# Patient Record
Sex: Male | Born: 1950
Health system: Southern US, Community
[De-identification: ages and names within clinical notes are randomized; demographics above are authoritative.]

## PROBLEM LIST (undated history)

## (undated) DIAGNOSIS — Z1379 Encounter for other screening for genetic and chromosomal anomalies: Secondary | ICD-10-CM

## (undated) DIAGNOSIS — H409 Unspecified glaucoma: Secondary | ICD-10-CM

## (undated) DIAGNOSIS — Z8042 Family history of malignant neoplasm of prostate: Secondary | ICD-10-CM

## (undated) DIAGNOSIS — K635 Polyp of colon: Secondary | ICD-10-CM

## (undated) DIAGNOSIS — I10 Essential (primary) hypertension: Secondary | ICD-10-CM

## (undated) DIAGNOSIS — C189 Malignant neoplasm of colon, unspecified: Secondary | ICD-10-CM

## (undated) HISTORY — PX: EYE SURGERY: SHX253

## (undated) HISTORY — DX: Polyp of colon: K63.5

## (undated) HISTORY — DX: Family history of malignant neoplasm of prostate: Z80.42

## (undated) HISTORY — DX: Essential (primary) hypertension: I10

## (undated) HISTORY — DX: Malignant neoplasm of colon, unspecified: C18.9

## (undated) HISTORY — DX: Unspecified glaucoma: H40.9

## (undated) HISTORY — DX: Encounter for other screening for genetic and chromosomal anomalies: Z13.79

---

## 2000-02-11 ENCOUNTER — Encounter: Payer: Self-pay | Admitting: Emergency Medicine

## 2000-02-11 ENCOUNTER — Emergency Department (HOSPITAL_COMMUNITY): Admission: EM | Admit: 2000-02-11 | Discharge: 2000-02-11 | Payer: Self-pay | Admitting: Emergency Medicine

## 2010-06-30 ENCOUNTER — Emergency Department (HOSPITAL_COMMUNITY)
Admission: EM | Admit: 2010-06-30 | Discharge: 2010-06-30 | Disposition: A | Discharge status: Home / Self Care | Payer: Self-pay | Attending: Emergency Medicine | Admitting: Emergency Medicine

## 2010-06-30 ENCOUNTER — Emergency Department (HOSPITAL_COMMUNITY): Payer: Self-pay

## 2010-06-30 DIAGNOSIS — W268XXA Contact with other sharp object(s), not elsewhere classified, initial encounter: Secondary | ICD-10-CM | POA: Insufficient documentation

## 2010-06-30 DIAGNOSIS — Z23 Encounter for immunization: Secondary | ICD-10-CM | POA: Insufficient documentation

## 2010-06-30 DIAGNOSIS — S51809A Unspecified open wound of unspecified forearm, initial encounter: Secondary | ICD-10-CM | POA: Insufficient documentation

## 2010-06-30 DIAGNOSIS — I10 Essential (primary) hypertension: Secondary | ICD-10-CM | POA: Insufficient documentation

## 2013-07-16 ENCOUNTER — Ambulatory Visit: Payer: Self-pay | Admitting: Emergency Medicine

## 2013-07-16 VITALS — BP 162/80 | HR 85 | Temp 98.3°F | Resp 15 | Ht 69.0 in | Wt 247.8 lb

## 2013-07-16 DIAGNOSIS — K921 Melena: Secondary | ICD-10-CM

## 2013-07-16 DIAGNOSIS — N400 Enlarged prostate without lower urinary tract symptoms: Secondary | ICD-10-CM

## 2013-07-16 DIAGNOSIS — R300 Dysuria: Secondary | ICD-10-CM

## 2013-07-16 LAB — POCT URINALYSIS DIPSTICK
Bilirubin, UA: NEGATIVE
Blood, UA: NEGATIVE
Glucose, UA: NEGATIVE
Ketones, UA: NEGATIVE
Leukocytes, UA: NEGATIVE
Nitrite, UA: NEGATIVE
Protein, UA: 30
Spec Grav, UA: 1.025
Urobilinogen, UA: 0.2
pH, UA: 6.5

## 2013-07-16 LAB — POCT CBC
Granulocyte percent: 55.3 %G (ref 37–80)
HCT, POC: 41.2 % — AB (ref 43.5–53.7)
Hemoglobin: 13.1 g/dL — AB (ref 14.1–18.1)
Lymph, poc: 1.8 (ref 0.6–3.4)
MCH, POC: 30.4 pg (ref 27–31.2)
MCHC: 31.8 g/dL (ref 31.8–35.4)
MCV: 95.7 fL (ref 80–97)
MID (cbc): 0.5 (ref 0–0.9)
MPV: 8.5 fL (ref 0–99.8)
POC Granulocyte: 2.8 (ref 2–6.9)
POC LYMPH PERCENT: 35.1 %L (ref 10–50)
POC MID %: 9.6 %M (ref 0–12)
Platelet Count, POC: 383 10*3/uL (ref 142–424)
RBC: 4.31 M/uL — AB (ref 4.69–6.13)
RDW, POC: 15.3 %
WBC: 5.1 10*3/uL (ref 4.6–10.2)

## 2013-07-16 LAB — POCT UA - MICROSCOPIC ONLY
Amorphous: POSITIVE
Casts, Ur, LPF, POC: NEGATIVE
Crystals, Ur, HPF, POC: NEGATIVE
Epithelial cells, urine per micros: NEGATIVE
Mucus, UA: POSITIVE
Yeast, UA: NEGATIVE

## 2013-07-16 LAB — IFOBT (OCCULT BLOOD): IFOBT: POSITIVE

## 2013-07-16 MED ORDER — CIPROFLOXACIN HCL 500 MG PO TABS: 500.0000 mg | ORAL_TABLET | Freq: Two times a day (BID) | ORAL | Status: DC

## 2013-07-16 NOTE — Progress Notes (Addendum)
Subjective:    Patient ID: Bryan Floyd, male    DOB: [birth year], 63 y.o.   MRN: [id number] This chart was scribed for Darlyne Russian, MD by Anastasia Pall, ED Scribe. This patient was seen in room 03 and the patient's care was started at 4:07 PM.  Chief Complaint  Patient presents with  . Urinary Retention    hard time urinating, since yesterday  . Back Pain    earlier this week   HPI Bryan Floyd is a 63 y.o. male Pt reports urinary retention, onset yesterday. 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 He reports associated lower back pain. When he is able to produce urine the states it is in very decrease amounts, drops. He reports prior to his symptoms he had normal flow with decent pressure. He denies having his prostate checked out before, denies recent blood work. He states he hasn't had his health checked out in several years due to cost. He reports his father had h/o prostate cancer.   PCP - Cathlean Cower, MD  There are no active problems to display for this patient.  Prior to Admission medications   Medication Sig Start Date End Date Taking? Authorizing Provider  aspirin 81 MG tablet Take 81 mg by mouth daily.   Yes Historical Provider, MD  atenolol (TENORMIN) 50 MG tablet Take 50 mg by mouth daily.   Yes Historical Provider, MD  cyclobenzaprine (FLEXERIL) 10 MG tablet Take 10 mg by mouth 3 (three) times daily as needed for muscle spasms.   Yes Historical Provider, MD  UNABLE TO FIND Take 1 tablet by mouth daily. Med Name: Richardean Sale PROF/O 2 tablets daily (helps with urinary retention)   Yes Historical Provider, MD   Review of Systems  Constitutional: Negative for fever.  Genitourinary: Positive for decreased urine volume and difficulty urinating (retention).  Musculoskeletal: Positive for back pain (lower).      Objective:   Physical Exam BP 162/80   Pulse 85  Temp(Src) 98.3 F (36.8 C) (Oral)  Resp 15  Ht 5\' 9"  (1.753 m)  Wt 247 lb 12.8 oz (112.401 kg)  BMI 36.58 kg/m2  SpO2 99%  Nursing note and vitals reviewed. Constitutional: He is oriented to person, place, and time. He appears well-developed and well-nourished. No distress.  HENT:  Head: Normocephalic and atraumatic.  Eyes: EOM are normal.  Neck: Neck supple.  Cardiovascular: Normal rate.   Pulmonary/Chest: Effort normal. No respiratory distress.  Musculoskeletal: Normal range of motion.  Neurological: He is alert and oriented to person, place, and time.  Skin: Skin is warm and dry.  Psychiatric: He has a normal mood and affect. His behavior is normal.  Amb . bladder is distended to the umbilicus. Prostate is significantly enlarged and nodular. Procedure note. Meatus was prepped with Betadine x2. A 16-gauge catheter was inserted 10 cc of fluid instilled into the balloon. Patient tolerated the procedure well. There is immediate return under pressure of urine. After 2 minutes there is 1000 cc of urine in the bag. Urine sent for evaluation    Results for orders placed in visit on 07/16/13  POCT CBC      Result Value Ref Range   WBC 5.1  4.6 - 10.2 K/uL   Lymph, poc 1.8  0.6 - 3.4   POC LYMPH PERCENT 35.1  10 - 50 %L   MID (cbc) 0.5  0 -  0.9   POC MID % 9.6  0 - 12 %M   POC Granulocyte 2.8  2 - 6.9   Granulocyte percent 55.3  37 - 80 %G   RBC 4.31 (*) 4.69 - 6.13 M/uL   Hemoglobin 13.1 (*) 14.1 - 18.1 g/dL   HCT, POC 41.2 (*) 43.5 - 53.7 %   MCV 95.7  80 - 97 fL   MCH, POC 30.4  27 - 31.2 pg   MCHC 31.8  31.8 - 35.4 g/dL   RDW, POC 15.3     Platelet Count, POC 383  142 - 424 K/uL   MPV 8.5  0 - 99.8 fL  IFOBT (OCCULT BLOOD)      Result Value Ref Range   IFOBT Positive    POCT URINALYSIS DIPSTICK      Result Value Ref Range   Color, UA dark yellow     Clarity, UA clear     Glucose, UA negative     Bilirubin, UA negative     Ketones, UA negative     Spec Grav,  UA 1.025     Blood, UA negative     pH, UA 6.5     Protein, UA 30     Urobilinogen, UA 0.2     Nitrite, UA negative     Leukocytes, UA Negative    POCT UA - MICROSCOPIC ONLY      Result Value Ref Range   WBC, Ur, HPF, POC 0-1     RBC, urine, microscopic 0-1     Bacteria, U Microscopic trace     Mucus, UA positive     Epithelial cells, urine per micros negative     Crystals, Ur, HPF, POC negative     Casts, Ur, LPF, POC negative     Yeast, UA negative     Amorphous positive     Assessment & Plan:  Patient presents with urinary retention. He has a significantly enlarged prostate which is nodular. He is a family history of prostate cancer. PSA was done. Catheter was inserted and will be left for now. Emergent appointment will be made for Tuesday [facility]. He can return to clinic here if he has difficulty with the catheter. A total of approximately 2000 cc was obtained .  I personally performed the services described in this documentation, which was scribed in my presence. The recorded information has been reviewed and is accurate.

## 2013-07-17 LAB — COMPREHENSIVE METABOLIC PANEL
ALT: 20 U/L (ref 0–53)
AST: 26 U/L (ref 0–37)
Albumin: 4.2 g/dL (ref 3.5–5.2)
Alkaline Phosphatase: 63 U/L (ref 39–117)
BUN: 16 mg/dL (ref 6–23)
CO2: 25 mEq/L (ref 19–32)
Calcium: 10 mg/dL (ref 8.4–10.5)
Chloride: 103 mEq/L (ref 96–112)
Creat: 1.04 mg/dL (ref 0.50–1.35)
Glucose, Bld: 102 mg/dL — ABNORMAL HIGH (ref 70–99)
Potassium: 3.8 mEq/L (ref 3.5–5.3)
Sodium: 139 mEq/L (ref 135–145)
Total Bilirubin: 0.4 mg/dL (ref 0.2–1.2)
Total Protein: 7.6 g/dL (ref 6.0–8.3)

## 2013-07-18 ENCOUNTER — Telehealth: Payer: Self-pay

## 2013-07-18 LAB — PSA: PSA: 1.52 ng/mL (ref <=4.00)

## 2013-07-18 LAB — URINE CULTURE
Colony Count: NO GROWTH
Organism ID, Bacteria: NO GROWTH

## 2013-07-18 NOTE — Telephone Encounter (Signed)
It is hard to say over the phone. If he is having any difficulty with the catheter, unable to void through the catheter, erythema, swelling, fever, chills, or drainage then he needs to RTC.   The blood could be from irritation of the catheter, but it may not be.   Please make sure patient will be seeing urology on 07/19/13.  Urine culture was negative.

## 2013-07-18 NOTE — Telephone Encounter (Signed)
Spoke to patient.  He denies any difficulty with catheter or other sx as mentioned in Ryan's message.  Advised patient to keep his urology appointment.  He said he would do so.

## 2013-07-18 NOTE — Telephone Encounter (Signed)
PATIENT STATES HE SAW DR. DAUB ON Saturday FOR PROBLEMS URINATING. DR. Everlene Farrier GAVE HIM A BAG TO PASS URINE IN AND YESTERDAY AND TODAY HE NOTICED A LITTLE BLOOD. HIS WIFE TOLD HIM TO CALL TO SEE IF HE SHOULD BE CONCERNED? HE SAID IT IS NOT A LOT, BUT JUST A LITTLE. BEST PHONE [phone] (HOME)  [place]

## 2013-07-22 ENCOUNTER — Encounter: Payer: Self-pay | Admitting: Gastroenterology

## 2013-07-23 ENCOUNTER — Encounter (HOSPITAL_COMMUNITY): Payer: Self-pay | Admitting: Emergency Medicine

## 2013-07-23 ENCOUNTER — Inpatient Hospital Stay (HOSPITAL_COMMUNITY)
Admission: EM | Admit: 2013-07-23 | Discharge: 2013-07-25 | DRG: 713 | Disposition: A | Discharge status: Home / Self Care | Payer: Self-pay | Attending: Family Medicine | Admitting: Family Medicine

## 2013-07-23 ENCOUNTER — Emergency Department (HOSPITAL_COMMUNITY): Payer: Self-pay

## 2013-07-23 ENCOUNTER — Emergency Department (HOSPITAL_COMMUNITY)
Admission: EM | Admit: 2013-07-23 | Discharge: 2013-07-23 | Disposition: A | Discharge status: Home / Self Care | Payer: Self-pay | Attending: Emergency Medicine | Admitting: Emergency Medicine

## 2013-07-23 DIAGNOSIS — Z8249 Family history of ischemic heart disease and other diseases of the circulatory system: Secondary | ICD-10-CM

## 2013-07-23 DIAGNOSIS — R319 Hematuria, unspecified: Secondary | ICD-10-CM

## 2013-07-23 DIAGNOSIS — I10 Essential (primary) hypertension: Secondary | ICD-10-CM | POA: Diagnosis present

## 2013-07-23 DIAGNOSIS — Z79899 Other long term (current) drug therapy: Secondary | ICD-10-CM

## 2013-07-23 DIAGNOSIS — R310 Gross hematuria: Secondary | ICD-10-CM | POA: Diagnosis present

## 2013-07-23 DIAGNOSIS — N390 Urinary tract infection, site not specified: Secondary | ICD-10-CM | POA: Diagnosis present

## 2013-07-23 DIAGNOSIS — D62 Acute posthemorrhagic anemia: Secondary | ICD-10-CM | POA: Diagnosis not present

## 2013-07-23 DIAGNOSIS — N3289 Other specified disorders of bladder: Secondary | ICD-10-CM | POA: Diagnosis present

## 2013-07-23 DIAGNOSIS — Z6835 Body mass index (BMI) 35.0-35.9, adult: Secondary | ICD-10-CM

## 2013-07-23 DIAGNOSIS — R339 Retention of urine, unspecified: Secondary | ICD-10-CM | POA: Diagnosis present

## 2013-07-23 DIAGNOSIS — T839XXA Unspecified complication of genitourinary prosthetic device, implant and graft, initial encounter: Secondary | ICD-10-CM

## 2013-07-23 DIAGNOSIS — T83091A Other mechanical complication of indwelling urethral catheter, initial encounter: Secondary | ICD-10-CM | POA: Insufficient documentation

## 2013-07-23 DIAGNOSIS — Z823 Family history of stroke: Secondary | ICD-10-CM

## 2013-07-23 DIAGNOSIS — Y846 Urinary catheterization as the cause of abnormal reaction of the patient, or of later complication, without mention of misadventure at the time of the procedure: Secondary | ICD-10-CM | POA: Insufficient documentation

## 2013-07-23 DIAGNOSIS — N401 Enlarged prostate with lower urinary tract symptoms: Principal | ICD-10-CM | POA: Diagnosis present

## 2013-07-23 DIAGNOSIS — N138 Other obstructive and reflux uropathy: Principal | ICD-10-CM | POA: Diagnosis present

## 2013-07-23 DIAGNOSIS — Z9889 Other specified postprocedural states: Secondary | ICD-10-CM | POA: Insufficient documentation

## 2013-07-23 DIAGNOSIS — Z792 Long term (current) use of antibiotics: Secondary | ICD-10-CM | POA: Insufficient documentation

## 2013-07-23 DIAGNOSIS — Z8669 Personal history of other diseases of the nervous system and sense organs: Secondary | ICD-10-CM | POA: Insufficient documentation

## 2013-07-23 DIAGNOSIS — Z7982 Long term (current) use of aspirin: Secondary | ICD-10-CM

## 2013-07-23 LAB — CBC WITH DIFFERENTIAL/PLATELET
Basophils Absolute: 0 10*3/uL (ref 0.0–0.1)
Basophils Relative: 1 % (ref 0–1)
Eosinophils Absolute: 0.2 10*3/uL (ref 0.0–0.7)
Eosinophils Relative: 4 % (ref 0–5)
HCT: 35 % — ABNORMAL LOW (ref 39.0–52.0)
Hemoglobin: 12.2 g/dL — ABNORMAL LOW (ref 13.0–17.0)
Lymphocytes Relative: 34 % (ref 12–46)
Lymphs Abs: 1.9 10*3/uL (ref 0.7–4.0)
MCH: 31.5 pg (ref 26.0–34.0)
MCHC: 34.9 g/dL (ref 30.0–36.0)
MCV: 90.4 fL (ref 78.0–100.0)
Monocytes Absolute: 0.6 10*3/uL (ref 0.1–1.0)
Monocytes Relative: 10 % (ref 3–12)
Neutro Abs: 2.8 10*3/uL (ref 1.7–7.7)
Neutrophils Relative %: 51 % (ref 43–77)
Platelets: 359 10*3/uL (ref 150–400)
RBC: 3.87 MIL/uL — ABNORMAL LOW (ref 4.22–5.81)
RDW: 14.9 % (ref 11.5–15.5)
WBC: 5.4 10*3/uL (ref 4.0–10.5)

## 2013-07-23 LAB — I-STAT CHEM 8, ED
BUN: 11 mg/dL (ref 6–23)
Calcium, Ion: 1.28 mmol/L (ref 1.13–1.30)
Chloride: 98 mEq/L (ref 96–112)
Creatinine, Ser: 1.1 mg/dL (ref 0.50–1.35)
Glucose, Bld: 109 mg/dL — ABNORMAL HIGH (ref 70–99)
HCT: 41 % (ref 39.0–52.0)
Hemoglobin: 13.9 g/dL (ref 13.0–17.0)
Potassium: 4 mEq/L (ref 3.7–5.3)
Sodium: 141 mEq/L (ref 137–147)
TCO2: 25 mmol/L (ref 0–100)

## 2013-07-23 LAB — URINALYSIS, ROUTINE W REFLEX MICROSCOPIC
Glucose, UA: 100 mg/dL — AB
Ketones, ur: 40 mg/dL — AB
Nitrite: POSITIVE — AB
Protein, ur: >300 mg/dL — AB
Specific Gravity, Urine: 1.024 (ref 1.005–1.030)
Urobilinogen, UA: 2 mg/dL — ABNORMAL HIGH (ref 0.0–1.0)
pH: 5.5 (ref 5.0–8.0)

## 2013-07-23 LAB — BASIC METABOLIC PANEL
BUN: 13 mg/dL (ref 6–23)
CO2: 24 mEq/L (ref 19–32)
Calcium: 9.5 mg/dL (ref 8.4–10.5)
Chloride: 98 mEq/L (ref 96–112)
Creatinine, Ser: 1.06 mg/dL (ref 0.50–1.35)
GFR calc Af Amer: 84 mL/min — ABNORMAL LOW (ref >90)
GFR calc non Af Amer: 73 mL/min — ABNORMAL LOW (ref >90)
Glucose, Bld: 108 mg/dL — ABNORMAL HIGH (ref 70–99)
Potassium: 4.1 mEq/L (ref 3.7–5.3)
Sodium: 136 mEq/L — ABNORMAL LOW (ref 137–147)

## 2013-07-23 LAB — CBC
HCT: 32.7 % — ABNORMAL LOW (ref 39.0–52.0)
Hemoglobin: 11.4 g/dL — ABNORMAL LOW (ref 13.0–17.0)
MCH: 31.2 pg (ref 26.0–34.0)
MCHC: 34.9 g/dL (ref 30.0–36.0)
MCV: 89.6 fL (ref 78.0–100.0)
Platelets: 363 10*3/uL (ref 150–400)
RBC: 3.65 MIL/uL — ABNORMAL LOW (ref 4.22–5.81)
RDW: 14.8 % (ref 11.5–15.5)
WBC: 7.6 10*3/uL (ref 4.0–10.5)

## 2013-07-23 LAB — URINE MICROSCOPIC-ADD ON

## 2013-07-23 LAB — PROTIME-INR
INR: 1.03 (ref 0.00–1.49)
Prothrombin Time: 13.3 seconds (ref 11.6–15.2)

## 2013-07-23 MED ORDER — ATENOLOL 50 MG PO TABS
50.0000 mg | ORAL_TABLET | Freq: Every day | ORAL | Status: DC
  Administered 2013-07-25: 50 mg via ORAL
  Filled 2013-07-23 (×2): qty 1

## 2013-07-23 MED ORDER — LATANOPROST 0.005 % OP SOLN
1.0000 [drp] | Freq: Every day | OPHTHALMIC | Status: DC
  Administered 2013-07-24 (×2): 1 [drp] via OPHTHALMIC
  Filled 2013-07-23: qty 2.5

## 2013-07-23 MED ORDER — SODIUM CHLORIDE 0.9 % IJ SOLN
3.0000 mL | Freq: Two times a day (BID) | INTRAMUSCULAR | Status: DC
  Administered 2013-07-24: 3 mL via INTRAVENOUS

## 2013-07-23 MED ORDER — ADULT MULTIVITAMIN W/MINERALS CH
1.0000 | ORAL_TABLET | Freq: Every day | ORAL | Status: DC
  Administered 2013-07-25: 1 via ORAL
  Filled 2013-07-23 (×2): qty 1

## 2013-07-23 MED ORDER — CEFIXIME 400 MG PO TABS: 400.0000 mg | ORAL_TABLET | Freq: Every day | ORAL | Status: DC

## 2013-07-23 MED ORDER — MORPHINE SULFATE 4 MG/ML IJ SOLN
4.0000 mg | Freq: Once | INTRAMUSCULAR | Status: AC
  Administered 2013-07-23: 4 mg via INTRAVENOUS
  Filled 2013-07-23: qty 1

## 2013-07-23 MED ORDER — HYDROCODONE-ACETAMINOPHEN 5-325 MG PO TABS
2.0000 | ORAL_TABLET | Freq: Once | ORAL | Status: AC
  Administered 2013-07-23: 2 via ORAL
  Filled 2013-07-23: qty 2

## 2013-07-23 MED ORDER — CYCLOBENZAPRINE HCL 10 MG PO TABS
10.0000 mg | ORAL_TABLET | Freq: Three times a day (TID) | ORAL | Status: DC | PRN
  Filled 2013-07-23: qty 1

## 2013-07-23 MED ORDER — SODIUM CHLORIDE 0.9 % IV SOLN
INTRAVENOUS | Status: DC
  Administered 2013-07-24 – 2013-07-25 (×4): via INTRAVENOUS

## 2013-07-23 MED ORDER — LIDOCAINE HCL 2 % EX GEL
Freq: Once | CUTANEOUS | Status: DC
Start: 2013-07-23 — End: 2013-07-23

## 2013-07-23 MED ORDER — CEFTRIAXONE SODIUM 1 G IJ SOLR
1.0000 g | INTRAMUSCULAR | Status: DC
  Administered 2013-07-24 (×2): 1 g via INTRAVENOUS
  Filled 2013-07-23 (×4): qty 10

## 2013-07-23 NOTE — ED Provider Notes (Signed)
  This was a shared visit with a mid-level provided (NP or PA).  Throughout the patient's course I was available for consultation/collaboration.  I saw the ECG (if appropriate), relevant labs and studies - I agree with the interpretation.  On my exam the patient was in no distress.  However, with the degree of blood and clots being produced, there was some concern for possible obstruction of his catheter prior to seeing urology. Patient required switch to three-way catheter to facilitate irrigation.  Patient tolerated this well.  He and his wife received teaching on catheter irrigation, and after initial ABX were provided, he was d/c in stable condition.       Carmin Muskrat, MD 07/23/13 1556

## 2013-07-23 NOTE — ED Notes (Signed)
Pt has new 3 way irrigation foley placed using 2 RN's and with sterile technique; RN educated pt and wife on how to irrigate if clotted. Wife was able to demonstrate how to do it.

## 2013-07-23 NOTE — ED Notes (Signed)
Supplies retrieved from central supply; foley to be changed and pt to be educated on irrigation procedure.

## 2013-07-23 NOTE — ED Provider Notes (Signed)
CSN: [id number]     Arrival date & time 07/23/13  1821 History   First MD Initiated Contact with Patient 07/23/13 1833     Chief Complaint  Patient presents with  . Bleeding from Foley      (Consider location/radiation/quality/duration/timing/severity/associated sxs/prior Treatment) HPI Comments: Foley plced last week for urinary retention. Clotted off this morning - changed to 3-way foley, given Suprax. Foley clotted off again tonight, returned to the ED.  Patient is a 63 y.o. male presenting with male genitourinary complaint. The history is provided by the patient.  Male GU Problem Presenting symptoms comment:  Bleeding from Foley Context: spontaneously   Relieved by:  Nothing Worsened by:  Nothing tried Ineffective treatments: Irrigation earlier today. Also had Foley switched to 3-way to facilitate irrigation. Associated symptoms: no abdominal pain, no fever and no vomiting   Risk factors: recent infection (UTI)     Past Medical History  Diagnosis Date  . Glaucoma   . Hypertension    Past Surgical History  Procedure Laterality Date  . Eye surgery      laser   Family History  Problem Relation Age of Onset  . Stroke Father   . Heart disease Father   . Cancer Father    History  Substance Use Topics  . Smoking status: Never Smoker   . Smokeless tobacco: Not on file  . Alcohol Use: Yes     Comment: 6 pack / week    Review of Systems  Constitutional: Negative for fever.  Respiratory: Negative for cough and shortness of breath.   Gastrointestinal: Negative for vomiting and abdominal pain.  All other systems reviewed and are negative.     Allergies  Review of patient's allergies indicates no known allergies.  Home Medications   Prior to Admission medications   Medication Sig Start Date End Date Taking? Authorizing Provider  aspirin 81 MG tablet Take 81 mg by mouth daily.   Yes Historical Provider, MD  atenolol (TENORMIN) 50 MG tablet Take 50 mg by mouth  daily.   Yes Historical Provider, MD  cefixime (SUPRAX) 400 MG tablet Take 1 tablet (400 mg total) by mouth daily. 07/23/13  Yes Lauren Burnetta Sabin, PA-C  ciprofloxacin (CIPRO) 500 MG tablet Take 1 tablet (500 mg total) by mouth 2 (two) times daily. 07/16/13  Yes Darlyne Russian, MD  cyclobenzaprine (FLEXERIL) 10 MG tablet Take 10 mg by mouth 3 (three) times daily as needed for muscle spasms.   Yes Historical Provider, MD  Multiple Vitamin (MULTIVITAMIN WITH MINERALS) TABS tablet Take 1 tablet by mouth daily.   Yes Historical Provider, MD  Travoprost, BAK Free, (TRAVATAN) 0.004 % SOLN ophthalmic solution Place 1 drop into both eyes at bedtime.   Yes Historical Provider, MD  UNABLE TO FIND Take 2 tablets by mouth daily. Med Name: Richardean Sale PROF/O 2 tablets daily (helps with urinary retention)   Yes Historical Provider, MD   BP 128/91  Pulse 115  Temp(Src) 98.7 F (37.1 C) (Oral)  Resp 20  SpO2 100% Physical Exam  Nursing note and vitals reviewed. Constitutional: He is oriented to person, place, and time. He appears well-developed and well-nourished. No distress.  HENT:  Head: Normocephalic and atraumatic.  Mouth/Throat: No oropharyngeal exudate.  Eyes: EOM are normal. Pupils are equal, round, and reactive to light.  Neck: Normal range of motion. Neck supple.  Cardiovascular: Normal rate and regular rhythm.  Exam reveals no friction rub.   No murmur heard. Pulmonary/Chest: Effort normal and breath  sounds normal. No respiratory distress. He has no wheezes. He has no rales.  Abdominal: Soft. He exhibits no distension. There is no tenderness. There is no rebound.  Genitourinary:  Catheter with blood in bag, appears like blood/urine mix  Musculoskeletal: Normal range of motion. He exhibits no edema.  Neurological: He is alert and oriented to person, place, and time.  Skin: No rash noted. He is not diaphoretic.    ED Course  Procedures (including critical care time) Labs Review Labs Reviewed   CBC - Abnormal; Notable for the following:    RBC 3.65 (*)    Hemoglobin 11.4 (*)    HCT 32.7 (*)    All other components within normal limits  BASIC METABOLIC PANEL - Abnormal; Notable for the following:    Sodium 136 (*)    Glucose, Bld 108 (*)    GFR calc non Af Amer 73 (*)    GFR calc Af Amer 84 (*)    All other components within normal limits  PROTIME-INR  TYPE AND SCREEN    Imaging Review No results found.   EKG Interpretation None      MDM   Final diagnoses:  Hematuria    89M here with bleeding from his Foley. Placed one week ago for urinary retention. Seen earlier today for bleeding - flushed, placed on Suprax for UTI. Catheter clotted off again. Appears like blood mixed with urine in the bag. Patient still feels like he's got a urinary obstruction. Will irrigate Foley again.  With 3 way irrigation, unable to get bleeding controlled. I spoke with Dr. Junious Silk, who he has f/u with. He will come see the patient. Labs show 2.5 g drop in hemoglobin. Will plan to admit. [facility] asked to help with pre-op clearance and possible admission.     Osvaldo Shipper, MD 07/23/13 [phone]

## 2013-07-23 NOTE — ED Notes (Signed)
Pt reports having foley placed last week for urinary retention, that one was replaced this am at [facility] because it was clotted off. Now pt having bleeding at urethra around foley and no output through foley. Wife attempted to irrigate foley without success PTA. Denies pain.

## 2013-07-23 NOTE — ED Notes (Signed)
PT ambulated with baseline gait; VSS; A&Ox3; no signs of distress; respirations even and unlabored; skin warm and dry; no questions upon discharge.  

## 2013-07-23 NOTE — H&P (Signed)
[facility] History and Physical  Ran Tullis VQM:[id number] DOB: 1950-09-21 DOA: 07/23/2013  Referring physician: EDP PCP: Cathlean Cower, MD   Chief Complaint: Gross hematuria, urinary retention   HPI: Bryan Floyd is a 63 y.o. male developed urinary retention 1 week ago, went to UC and foley catheter was placed.  2L drained at that time, 3 days ago developed gross hematuria, came to ED this morning due to having clotted off, foley replaced with 3 way and irrigated.  He was discharged but it clotted off once again and so he returns to the ED with continued gross hematuria and clot retention.  No prior GU history, does have history suspicious of BPH for several years with weak stream and straining to urinate.  No cardiac or pulmonary history beyond HTN.  He reports he can walk up a flight of stairs without any problems, no recent chest pain or SOB.  Review of Systems: Systems reviewed.  As above, otherwise negative  Past Medical History  Diagnosis Date  . Glaucoma   . Hypertension    Past Surgical History  Procedure Laterality Date  . Eye surgery      laser   Social History:  reports that he has never smoked. He does not have any smokeless tobacco history on file. He reports that he drinks alcohol. He reports that he does not use illicit drugs.  No Known Allergies  Family History  Problem Relation Age of Onset  . Stroke Father   . Heart disease Father   . Cancer Father      Prior to Admission medications   Medication Sig Start Date End Date Taking? Authorizing Provider  aspirin 81 MG tablet Take 81 mg by mouth daily.   Yes Historical Provider, MD  atenolol (TENORMIN) 50 MG tablet Take 50 mg by mouth daily.   Yes Historical Provider, MD  cefixime (SUPRAX) 400 MG tablet Take 1 tablet (400 mg total) by mouth daily. 07/23/13  Yes Lauren Burnetta Sabin, PA-C  ciprofloxacin (CIPRO) 500 MG tablet Take 1 tablet (500 mg total) by mouth 2 (two) times daily. 07/16/13  Yes  Darlyne Russian, MD  cyclobenzaprine (FLEXERIL) 10 MG tablet Take 10 mg by mouth 3 (three) times daily as needed for muscle spasms.   Yes Historical Provider, MD  Multiple Vitamin (MULTIVITAMIN WITH MINERALS) TABS tablet Take 1 tablet by mouth daily.   Yes Historical Provider, MD  Travoprost, BAK Free, (TRAVATAN) 0.004 % SOLN ophthalmic solution Place 1 drop into both eyes at bedtime.   Yes Historical Provider, MD  UNABLE TO FIND Take 2 tablets by mouth daily. Med Name: Richardean Sale PROF/O 2 tablets daily (helps with urinary retention)   Yes Historical Provider, MD   Physical Exam: Filed Vitals:   07/23/13 2220  BP: 133/80  Pulse: 86  Temp:   Resp: 18    BP 133/80  Pulse 86  Temp(Src) 98.4 F (36.9 C) (Oral)  Resp 18  SpO2 99%  General Appearance:    Alert, oriented, no distress, appears stated age  Head:    Normocephalic, atraumatic  Eyes:    PERRL, EOMI, sclera non-icteric        Nose:   Nares without drainage or epistaxis. Mucosa, turbinates normal  Throat:   Moist mucous membranes. Oropharynx without erythema or exudate.  Neck:   Supple. No carotid bruits.  No thyromegaly.  No lymphadenopathy.   Back:     No CVA tenderness, no spinal tenderness  Lungs:     Clear to  auscultation bilaterally, without wheezes, rhonchi or rales  Chest wall:    No tenderness to palpitation  Heart:    Regular rate and rhythm without murmurs, gallops, rubs  Abdomen:     Soft, non-tender, nondistended, normal bowel sounds, no organomegaly  Genitalia:    deferred  Rectal:    deferred  Extremities:   No clubbing, cyanosis or edema.  Pulses:   2+ and symmetric all extremities  Skin:   Skin color, texture, turgor normal, no rashes or lesions  Lymph nodes:   Cervical, supraclavicular, and axillary nodes normal  Neurologic:   CNII-XII intact. Normal strength, sensation and reflexes      throughout    Labs on Admission:  Basic Metabolic Panel:  Recent Labs Lab 07/23/13 0648 07/23/13 2211  NA 141  136*  K 4.0 4.1  CL 98 98  CO2  --  24  GLUCOSE 109* 108*  BUN 11 13  CREATININE 1.10 1.06  CALCIUM  --  9.5   Liver Function Tests: No results found for this basename: AST, ALT, ALKPHOS, BILITOT, PROT, ALBUMIN,  in the last 168 hours No results found for this basename: LIPASE, AMYLASE,  in the last 168 hours No results found for this basename: AMMONIA,  in the last 168 hours CBC:  Recent Labs Lab 07/23/13 0635 07/23/13 0648 07/23/13 2211  WBC 5.4  --  7.6  NEUTROABS 2.8  --   --   HGB 12.2* 13.9 11.4*  HCT 35.0* 41.0 32.7*  MCV 90.4  --  89.6  PLT 359  --  363   Cardiac Enzymes: No results found for this basename: CKTOTAL, CKMB, CKMBINDEX, TROPONINI,  in the last 168 hours  BNP (last 3 results) No results found for this basename: PROBNP,  in the last 8760 hours CBG: No results found for this basename: GLUCAP,  in the last 168 hours  Radiological Exams on Admission: No results found.  EKG: Independently reviewed.  Non-specific T wave abnormalities in lateral leads.  No depression nor T wave inversions seen.  No prior available for comparison.  Assessment/Plan Principal Problem:   Hematuria, gross Active Problems:   Acute blood loss anemia   UTI (lower urinary tract infection)   1. Hematuria, gross producing acute blood loss anemia - likely due to BPH (please see Dr. Lyndal Rainbow note) unfortunately the patient has dropped about 1 gm of hemoglobin over the past 15 hours or so (of the 2 hemoglobins drawn this morning at the same time, I would consider the actual lab hemoglobin (12.2) to be more accurate than the i-stat device (13.9) so not sure that its truly a full 2.5 gm loss).  Regardless of exact loss, this still represents a large bleed and surgery to control it is certainly appropriate.  Will monitor blood loss, repeat HGB in AM, type and screen, wont transfuse just yet but may well need this by morning.  Will start NS for ongoing volume loss, will put patient on  tele monitor, SCDs only for DVT ppx, and hold his ASA 81 he takes at home. 2. Surgical clearance - Given the urgent / emergent nature of the surgery (I think it is likely that he will continue to bleed until controlled via surgery), patient is automatically considered cleared for surgery. 1. With that said, he has no history of recent cardiac nor pulmonary symptoms, no worrisome history of heart or lung disease, and no specific EKG findings to suggest the presence of cardiac problems, if this were not  urgent then patient likely would be considered low risk for surgery. 3. UTI - patient with possible UTI (positive nitrites and leukocyte esterase in urine), unclear how much this is contributing to his bleeding (likely related to BPH).  No SIRS criteria at all to suggest systemic involvement or sepsis.  Will put him on rocephin empirically for now.    Code Status: Full Code  Family Communication: Wife at bedside Disposition Plan: Admit to inpatient   Time spent: 70 min  [facility] Pager [phone]  If 7AM-7PM, please contact the day team taking care of the patient [email or web address] Password [email or web address] 07/23/2013, 11:40 PM

## 2013-07-23 NOTE — ED Provider Notes (Signed)
CSN: [id number]     Arrival date & time 07/23/13  0249 History   First MD Initiated Contact with Patient 07/23/13 [phone]     Chief Complaint  Patient presents with  . Hematuria     (Consider location/radiation/quality/duration/timing/severity/associated sxs/prior Treatment) HPI Comments: The patient is a 63 year old male presents emergency room chief complaint of Foley catheter problem for several days. The patient had a Foley catheter placed on 07/16/2013 for urinary retention. He reports he had intermittent blood in urine for the past 3 days which has worsened. He reports he had urged to urinate but he was unable to due to a clog in the catheter tubing and had blood and urine from around the catheter site today. He has not followed up with urology do to a miscommunication an appointment and has an appointment 6/11.  He denies fever, chills, and abdominal pain/discomfort.  Patient is a 63 y.o. male presenting with hematuria. The history is provided by the patient and medical records. No language interpreter was used.  Hematuria Pertinent negatives include no abdominal pain, chills, fever, nausea or vomiting.    Past Medical History  Diagnosis Date  . Glaucoma   . Hypertension    Past Surgical History  Procedure Laterality Date  . Eye surgery      laser   Family History  Problem Relation Age of Onset  . Stroke Father   . Heart disease Father   . Cancer Father    History  Substance Use Topics  . Smoking status: Never Smoker   . Smokeless tobacco: Not on file  . Alcohol Use: Yes     Comment: 6 pack / week    Review of Systems  Constitutional: Negative for fever and chills.  Gastrointestinal: Negative for nausea, vomiting, abdominal pain, diarrhea, constipation and abdominal distention.  Genitourinary: Positive for hematuria and decreased urine volume.      Allergies  Review of patient's allergies indicates no known allergies.  Home Medications   Prior to Admission  medications   Medication Sig Start Date End Date Taking? Authorizing Provider  aspirin 81 MG tablet Take 81 mg by mouth daily.   Yes Historical Provider, MD  atenolol (TENORMIN) 50 MG tablet Take 50 mg by mouth daily.   Yes Historical Provider, MD  ciprofloxacin (CIPRO) 500 MG tablet Take 1 tablet (500 mg total) by mouth 2 (two) times daily. 07/16/13  Yes Darlyne Russian, MD  cyclobenzaprine (FLEXERIL) 10 MG tablet Take 10 mg by mouth 3 (three) times daily as needed for muscle spasms.   Yes Historical Provider, MD  UNABLE TO FIND Take 1 tablet by mouth daily. Med Name: Richardean Sale PROF/O 2 tablets daily (helps with urinary retention)   Yes Historical Provider, MD   BP 126/88  Pulse 62  Temp(Src) 97.7 F (36.5 C) (Oral)  Resp 18  SpO2 99% Physical Exam  Nursing note and vitals reviewed. Constitutional: He is oriented to person, place, and time. He appears well-developed and well-nourished.  Non-toxic appearance. He does not have a sickly appearance. He does not appear ill. No distress.  HENT:  Head: Normocephalic and atraumatic.  Eyes: EOM are normal. Pupils are equal, round, and reactive to light.  Neck: Normal range of motion. Neck supple.  Cardiovascular: Normal rate and regular rhythm.   Pulmonary/Chest: Effort normal and breath sounds normal. No respiratory distress. He has no wheezes. He has no rales.  Abdominal: Soft. Bowel sounds are normal. He exhibits no distension and no mass. There is no  tenderness. There is no rebound and no guarding.  Genitourinary:  Foley catheter in place. No blood or discharge from the meatus. Catheter bag with red urine.  Dried blood noted to bilateral medial thighs.  Musculoskeletal: Normal range of motion.  Neurological: He is alert and oriented to person, place, and time.  Skin: Skin is warm and dry. He is not diaphoretic.  Psychiatric: He has a normal mood and affect. His behavior is normal.    ED Course  Procedures (including critical care  time) Labs Review Results for orders placed during the hospital encounter of 07/23/13  URINALYSIS, ROUTINE W REFLEX MICROSCOPIC      Result Value Ref Range   Color, Urine RED (*) YELLOW   APPearance CLOUDY (*) CLEAR   Specific Gravity, Urine 1.024  1.005 - 1.030   pH 5.5  5.0 - 8.0   Glucose, UA 100 (*) NEGATIVE mg/dL   Hgb urine dipstick LARGE (*) NEGATIVE   Bilirubin Urine LARGE (*) NEGATIVE   Ketones, ur 40 (*) NEGATIVE mg/dL   Protein, ur >300 (*) NEGATIVE mg/dL   Urobilinogen, UA 2.0 (*) 0.0 - 1.0 mg/dL   Nitrite POSITIVE (*) NEGATIVE   Leukocytes, UA LARGE (*) NEGATIVE  URINE MICROSCOPIC-ADD ON      Result Value Ref Range   Squamous Epithelial / LPF RARE  RARE   WBC, UA TOO NUMEROUS TO COUNT  <3 WBC/hpf   RBC / HPF TOO NUMEROUS TO COUNT  <3 RBC/hpf   Bacteria, UA MANY (*) RARE  CBC WITH DIFFERENTIAL      Result Value Ref Range   WBC 5.4  4.0 - 10.5 K/uL   RBC 3.87 (*) 4.22 - 5.81 MIL/uL   Hemoglobin 12.2 (*) 13.0 - 17.0 g/dL   HCT 35.0 (*) 39.0 - 52.0 %   MCV 90.4  78.0 - 100.0 fL   MCH 31.5  26.0 - 34.0 pg   MCHC 34.9  30.0 - 36.0 g/dL   RDW 14.9  11.5 - 15.5 %   Platelets 359  150 - 400 K/uL   Neutrophils Relative % 51  43 - 77 %   Neutro Abs 2.8  1.7 - 7.7 K/uL   Lymphocytes Relative 34  12 - 46 %   Lymphs Abs 1.9  0.7 - 4.0 K/uL   Monocytes Relative 10  3 - 12 %   Monocytes Absolute 0.6  0.1 - 1.0 K/uL   Eosinophils Relative 4  0 - 5 %   Eosinophils Absolute 0.2  0.0 - 0.7 K/uL   Basophils Relative 1  0 - 1 %   Basophils Absolute 0.0  0.0 - 0.1 K/uL  I-STAT CHEM 8, ED      Result Value Ref Range   Sodium 141  137 - 147 mEq/L   Potassium 4.0  3.7 - 5.3 mEq/L   Chloride 98  96 - 112 mEq/L   BUN 11  6 - 23 mg/dL   Creatinine, Ser 1.10  0.50 - 1.35 mg/dL   Glucose, Bld 109 (*) 70 - 99 mg/dL   Calcium, Ion 1.28  1.13 - 1.30 mmol/L   TCO2 25  0 - 100 mmol/L   Hemoglobin 13.9  13.0 - 17.0 g/dL   HCT 41.0  39.0 - 52.0 %   Imaging Review No results  found.   EKG Interpretation None      MDM   Final diagnoses:  Foley catheter problem  UTI (lower urinary tract infection)  Patient presents for  a urinary catheter problem. Catheter was replaced in the emergency department today and is now draining, inspection of the tubing and bag shows a large amount of blood in urine.  UA shows positive nitrite positive leukocytes with too numerous to count RBCs and WBCs and many bacteria. Urine culture sent. CBC shows Hbg 12.2, 13.1 one week ago. Patient reports compliance with Cipro as an outpatient. Will start Suprax for UTI.  Catheter flushing and care education given by RN. Advised patient to followup with urology on Monday.  Dr. Vanita Panda also evaluated the patient during this encounter.  Discussed lab results, and treatment plan with the patient. Return precautions given. Reports understanding and no other concerns at this time.  Patient is stable for discharge at this time.  Meds given in ED:  Medications  lidocaine (XYLOCAINE) 2 % jelly (not administered)    New Prescriptions   CEFIXIME (SUPRAX) 400 MG TABLET    Take 1 tablet (400 mg total) by mouth daily.        Lorrine Kin, PA-C 07/23/13 1011

## 2013-07-23 NOTE — ED Notes (Signed)
Materials paged and looking for foley.

## 2013-07-23 NOTE — Discharge Instructions (Signed)
Call for a follow up appointment with a Family or Primary Care Provider.  Call for a follow up appointment with a Urologist. Return to the [facility] Emergency Department  if symptoms worsen or you develop a fever (the Urologist on call will be at this ED).   Take medication as prescribed.

## 2013-07-23 NOTE — ED Notes (Signed)
Foley flushed with 1 L Normal Saline per verbal order from Dr Mingo Amber. Fluid draining from Foley continues to appear dark red.

## 2013-07-23 NOTE — ED Notes (Signed)
Pt's chart suggest he was to be seen at [facility] on Tuesday May 26th, pt states he called and he was not on the scheduled appointment list but did schedule him an appointment for June 11th. Pt's father had prostate cancer

## 2013-07-23 NOTE — Consult Note (Signed)
Consult: Gross hematuria, urinary retention Requested by: Dr. Mingo Amber  History of Present Illness: 63 year-old he developed urinary retention about one week ago. He went to urgent care and a Foley catheter was placed. 2 L were drained per the notes. About 3 days ago he began to develop gross hematuria. He came to the ER this morning, his catheter was removed and a three-way Foley catheter was placed. It was irrigated and he was discharged. He returned to the ER this evening with continued gross hematuria and clot retention.  He denies prior GU history or history BPH. He reports he has not seen a doctor regularly. For the past several years his voided with a weak stream and straining to urinate. He denies any cardiac issues or diabetes. He has been neurogenic risk factors.  He denies any smoking, chemotherapy or radiation exposure, or chemical exposure. 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 He denies any chest pain or shortness of breath. He doesn't exercise but he walks some.  Past Medical History  Diagnosis Date  . Glaucoma   . Hypertension    Past Surgical History  Procedure Laterality Date  . Eye surgery      laser    Home Medications:   (Not in a hospital admission) Allergies: No Known Allergies  Family History  Problem Relation Age of Onset  . Stroke Father   . Heart disease Father   . Cancer Father    Social History:  reports that he has never smoked. He does not have any smokeless tobacco history on file. He reports that he drinks alcohol. He reports that he does not use illicit drugs.  ROS: A complete review of systems was performed.  All systems are negative except for pertinent findings as noted. Review of Systems  All other systems reviewed and are negative.    Physical Exam:  Vital signs in last 24 hours: Temp:  [97.7 F (36.5 C)-98.7 F (37.1 C)] 98.4 F (36.9 C) (05/30 2200) Pulse Rate:  [50-115] 86 (05/30 2220) Resp:  [18-20] 18 (05/30  2220) BP: (117-146)/(60-91) 133/80 mmHg (05/30 2220) SpO2:  [93 %-100 %] 99 % (05/30 2220) General:  Alert and oriented, No acute distress HEENT: Normocephalic, atraumatic Neck: No JVD or lymphadenopathy Cardiovascular: Regular rate and rhythm Lungs: Regular rate and effort Abdomen: Soft, nontender, nondistended, no abdominal masses Back: No CVA tenderness Extremities: No edema Neurologic: Grossly intact GU: 18 French three-way Foley catheter in place. On initial exam it was not draining. CBI was turned on and again the catheter was not draining. He felt like his bladder was full. The Foley catheter was mobile and seemed to be in the correct position.  Procedure: I irrigated the Foley with multiple bottles of water. It irrigated normally with equal return. Initially there were a lot of small clots, but eventually he seemed to be clot free. The urine remained light red and I hooked him back up to CBI. CBI ran normally with equal return. At a moderate rate he remains light red to pink.   Laboratory Data:  Results for orders placed during the hospital encounter of 07/23/13 (from the past 24 hour(s))  CBC     Status: Abnormal   Collection Time    07/23/13 10:11 PM      Result Value Ref Range   WBC 7.6  4.0 - 10.5 K/uL   RBC 3.65 (*) 4.22 - 5.81 MIL/uL   Hemoglobin 11.4 (*) 13.0 - 17.0 g/dL  HCT 32.7 (*) 39.0 - 52.0 %   MCV 89.6  78.0 - 100.0 fL   MCH 31.2  26.0 - 34.0 pg   MCHC 34.9  30.0 - 36.0 g/dL   RDW 14.8  11.5 - 15.5 %   Platelets 363  150 - 400 K/uL  BASIC METABOLIC PANEL     Status: Abnormal   Collection Time    07/23/13 10:11 PM      Result Value Ref Range   Sodium 136 (*) 137 - 147 mEq/L   Potassium 4.1  3.7 - 5.3 mEq/L   Chloride 98  96 - 112 mEq/L   CO2 24  19 - 32 mEq/L   Glucose, Bld 108 (*) 70 - 99 mg/dL   BUN 13  6 - 23 mg/dL   Creatinine, Ser 1.06  0.50 - 1.35 mg/dL   Calcium 9.5  8.4 - 10.5 mg/dL   GFR calc non Af Amer 73 (*) >90 mL/min   GFR calc Af Amer  84 (*) >90 mL/min  PROTIME-INR     Status: None   Collection Time    07/23/13 10:11 PM      Result Value Ref Range   Prothrombin Time 13.3  11.6 - 15.2 seconds   INR 1.03  0.00 - 1.49   Recent Results (from the past 240 hour(s))  URINE CULTURE     Status: None   Collection Time    07/16/13  4:47 PM      Result Value Ref Range Status   Colony Count NO GROWTH   Final   Organism ID, Bacteria NO GROWTH   Final   Creatinine:  Recent Labs  07/23/13 0648 07/23/13 2211  CREATININE 1.10 1.06    Impression/Assessment/plan: 1-urinary retention-this is likely from BPH. Foley catheter in place. May need TURP.   2-gross hematuria-also likely from BPH, but bleeding continues and hemoglobin has dropped some throughout the day. I discussed with the patient and his wife the standard workup of hematuria with CT with IV contrast and cystoscopy. -Need CT scan with IV contrast to evaluate urinary tract, bladder and prostate.  -Type and screen -Preoperative clearance -Will continue CBI, keep n.p.o. and plan for cystoscopy, possible TURP, possible TURBT tomorrow. Discussed with patient and wife nature, risks, benefits and alternatives to TURP, TURBT including risk of bladder perforation, incontinence, stricture, failure to relieve retention. We discussed typically with a TURP flow symptoms improve, frequency and urgency can improve but in some cases worsen. We discussed he may need to continue to have a Foley catheter or do CIC due to a weak bladder, but if it is the prostate bleeding hopefully TURP will control it. We discussed alternatives such as medications but these can take a few months to shrink the prostate.   Festus Aloe 07/23/2013, 11:08 PM

## 2013-07-23 NOTE — ED Notes (Signed)
RN attempting to get irrigation foley.

## 2013-07-23 NOTE — ED Notes (Signed)
Pt presents with a foley catheter that was placed 07/16/2013 for urinary retention, pt reports he began having intermittent episodes of bloody urine x2-3 days and the urge to urinate, pt states he has increase of blood in his urine today. Pt has an appointment with a urologist June 11th.

## 2013-07-24 ENCOUNTER — Encounter (HOSPITAL_COMMUNITY): Payer: Self-pay | Admitting: Anesthesiology

## 2013-07-24 ENCOUNTER — Encounter (HOSPITAL_COMMUNITY)
Admission: EM | Disposition: A | Discharge status: Home / Self Care | Payer: Self-pay | Source: Home / Self Care | Attending: Family Medicine

## 2013-07-24 ENCOUNTER — Inpatient Hospital Stay (HOSPITAL_COMMUNITY): Payer: Self-pay | Admitting: Anesthesiology

## 2013-07-24 ENCOUNTER — Encounter (HOSPITAL_COMMUNITY): Payer: Self-pay | Admitting: *Deleted

## 2013-07-24 DIAGNOSIS — R319 Hematuria, unspecified: Secondary | ICD-10-CM

## 2013-07-24 HISTORY — PX: CYSTOSCOPY: SHX5120

## 2013-07-24 HISTORY — PX: TRANSURETHRAL RESECTION OF PROSTATE: SHX73

## 2013-07-24 LAB — BASIC METABOLIC PANEL
BUN: 12 mg/dL (ref 6–23)
BUN: 12 mg/dL (ref 6–23)
CO2: 27 mEq/L (ref 19–32)
CO2: 25 mEq/L (ref 19–32)
Calcium: 9.2 mg/dL (ref 8.4–10.5)
Calcium: 8.9 mg/dL (ref 8.4–10.5)
Chloride: 102 mEq/L (ref 96–112)
Chloride: 104 mEq/L (ref 96–112)
Creatinine, Ser: 1.07 mg/dL (ref 0.50–1.35)
Creatinine, Ser: 1.15 mg/dL (ref 0.50–1.35)
GFR calc Af Amer: 83 mL/min — ABNORMAL LOW (ref >90)
GFR calc Af Amer: 76 mL/min — ABNORMAL LOW (ref >90)
GFR calc non Af Amer: 72 mL/min — ABNORMAL LOW (ref >90)
GFR calc non Af Amer: 66 mL/min — ABNORMAL LOW (ref >90)
Glucose, Bld: 112 mg/dL — ABNORMAL HIGH (ref 70–99)
Glucose, Bld: 129 mg/dL — ABNORMAL HIGH (ref 70–99)
Potassium: 4.4 mEq/L (ref 3.7–5.3)
Potassium: 4 mEq/L (ref 3.7–5.3)
Sodium: 139 mEq/L (ref 137–147)
Sodium: 138 mEq/L (ref 137–147)

## 2013-07-24 LAB — URINE CULTURE
Colony Count: NO GROWTH
Culture: NO GROWTH

## 2013-07-24 LAB — CBC
HCT: 30.9 % — ABNORMAL LOW (ref 39.0–52.0)
HCT: 28.5 % — ABNORMAL LOW (ref 39.0–52.0)
Hemoglobin: 10.3 g/dL — ABNORMAL LOW (ref 13.0–17.0)
Hemoglobin: 9.6 g/dL — ABNORMAL LOW (ref 13.0–17.0)
MCH: 30.4 pg (ref 26.0–34.0)
MCH: 30.7 pg (ref 26.0–34.0)
MCHC: 33.3 g/dL (ref 30.0–36.0)
MCHC: 33.7 g/dL (ref 30.0–36.0)
MCV: 91.2 fL (ref 78.0–100.0)
MCV: 91.1 fL (ref 78.0–100.0)
Platelets: 322 10*3/uL (ref 150–400)
Platelets: 290 10*3/uL (ref 150–400)
RBC: 3.39 MIL/uL — ABNORMAL LOW (ref 4.22–5.81)
RBC: 3.13 MIL/uL — ABNORMAL LOW (ref 4.22–5.81)
RDW: 14.9 % (ref 11.5–15.5)
RDW: 14.8 % (ref 11.5–15.5)
WBC: 6.1 10*3/uL (ref 4.0–10.5)
WBC: 10.4 10*3/uL (ref 4.0–10.5)

## 2013-07-24 LAB — SURGICAL PCR SCREEN
MRSA, PCR: NEGATIVE
Staphylococcus aureus: NEGATIVE

## 2013-07-24 LAB — TYPE AND SCREEN
ABO/RH(D): O POS
Antibody Screen: NEGATIVE

## 2013-07-24 LAB — ABO/RH: ABO/RH(D): O POS

## 2013-07-24 SURGERY — CYSTOSCOPY
Anesthesia: General

## 2013-07-24 MED ORDER — TAMSULOSIN HCL 0.4 MG PO CAPS
0.4000 mg | ORAL_CAPSULE | Freq: Every day | ORAL | Status: DC
  Administered 2013-07-24: 0.4 mg via ORAL
  Filled 2013-07-24 (×2): qty 1

## 2013-07-24 MED ORDER — BELLADONNA ALKALOIDS-OPIUM 16.2-60 MG RE SUPP
RECTAL | Status: AC
  Filled 2013-07-24: qty 1

## 2013-07-24 MED ORDER — CIPROFLOXACIN IN D5W 400 MG/200ML IV SOLN
400.0000 mg | Freq: Once | INTRAVENOUS | Status: AC
  Administered 2013-07-24: 400 mg via INTRAVENOUS

## 2013-07-24 MED ORDER — DEXAMETHASONE SODIUM PHOSPHATE 10 MG/ML IJ SOLN
INTRAMUSCULAR | Status: AC
  Filled 2013-07-24: qty 1

## 2013-07-24 MED ORDER — OXYCODONE-ACETAMINOPHEN 5-325 MG PO TABS
1.0000 | ORAL_TABLET | ORAL | Status: DC | PRN
  Administered 2013-07-24: 1 via ORAL
  Administered 2013-07-25: 2 via ORAL
  Filled 2013-07-24: qty 2
  Filled 2013-07-24: qty 1

## 2013-07-24 MED ORDER — SUCCINYLCHOLINE CHLORIDE 20 MG/ML IJ SOLN
INTRAMUSCULAR | Status: DC | PRN
  Administered 2013-07-24: 100 mg via INTRAVENOUS

## 2013-07-24 MED ORDER — ONDANSETRON HCL 4 MG/2ML IJ SOLN
INTRAMUSCULAR | Status: AC
  Filled 2013-07-24: qty 2

## 2013-07-24 MED ORDER — CHLORHEXIDINE GLUCONATE CLOTH 2 % EX PADS
6.0000 | MEDICATED_PAD | Freq: Once | CUTANEOUS | Status: AC
  Administered 2013-07-24: 6 via TOPICAL

## 2013-07-24 MED ORDER — CISATRACURIUM BESYLATE 20 MG/10ML IV SOLN
INTRAVENOUS | Status: AC
  Filled 2013-07-24: qty 10

## 2013-07-24 MED ORDER — CISATRACURIUM BESYLATE (PF) 10 MG/5ML IV SOLN
INTRAVENOUS | Status: DC | PRN
  Administered 2013-07-24: 4 mg via INTRAVENOUS

## 2013-07-24 MED ORDER — SODIUM CHLORIDE 0.9 % IR SOLN: 3000.0000 mL | Status: DC

## 2013-07-24 MED ORDER — NEOSTIGMINE METHYLSULFATE 10 MG/10ML IV SOLN
INTRAVENOUS | Status: DC | PRN
  Administered 2013-07-24: 2 mg via INTRAVENOUS

## 2013-07-24 MED ORDER — PROMETHAZINE HCL 25 MG/ML IJ SOLN: 6.2500 mg | INTRAMUSCULAR | Status: DC | PRN

## 2013-07-24 MED ORDER — LACTATED RINGERS IV SOLN: INTRAVENOUS | Status: DC

## 2013-07-24 MED ORDER — SODIUM CHLORIDE 0.9 % IR SOLN
Status: DC | PRN
  Administered 2013-07-24: 3000 mL via INTRAVESICAL
  Administered 2013-07-24: 1000 mL via INTRAVESICAL
  Administered 2013-07-24 (×13): 3000 mL via INTRAVESICAL
  Administered 2013-07-24: 1000 mL via INTRAVESICAL
  Administered 2013-07-24: 3000 mL via INTRAVESICAL

## 2013-07-24 MED ORDER — DOCUSATE SODIUM 100 MG PO CAPS
100.0000 mg | ORAL_CAPSULE | Freq: Two times a day (BID) | ORAL | Status: DC
  Administered 2013-07-24 – 2013-07-25 (×3): 100 mg via ORAL
  Filled 2013-07-24 (×4): qty 1

## 2013-07-24 MED ORDER — GLYCOPYRROLATE 0.2 MG/ML IJ SOLN
INTRAMUSCULAR | Status: DC | PRN
  Administered 2013-07-24: 0.3 mg via INTRAVENOUS

## 2013-07-24 MED ORDER — LACTATED RINGERS IV SOLN
INTRAVENOUS | Status: DC | PRN
  Administered 2013-07-24 (×2): via INTRAVENOUS

## 2013-07-24 MED ORDER — HYDROMORPHONE HCL PF 1 MG/ML IJ SOLN: 0.2500 mg | INTRAMUSCULAR | Status: DC | PRN

## 2013-07-24 MED ORDER — FENTANYL CITRATE 0.05 MG/ML IJ SOLN
INTRAMUSCULAR | Status: AC
  Filled 2013-07-24: qty 2

## 2013-07-24 MED ORDER — HYOSCYAMINE SULFATE 0.125 MG SL SUBL
0.1250 mg | SUBLINGUAL_TABLET | SUBLINGUAL | Status: DC | PRN
  Administered 2013-07-24: 0.125 mg via SUBLINGUAL
  Filled 2013-07-24: qty 1

## 2013-07-24 MED ORDER — SODIUM CHLORIDE 0.9 % IR SOLN
3000.0000 mL | Status: DC
  Administered 2013-07-24: 3000 mL

## 2013-07-24 MED ORDER — CIPROFLOXACIN IN D5W 400 MG/200ML IV SOLN
INTRAVENOUS | Status: AC
  Filled 2013-07-24: qty 200

## 2013-07-24 MED ORDER — FENTANYL CITRATE 0.05 MG/ML IJ SOLN
INTRAMUSCULAR | Status: DC | PRN
  Administered 2013-07-24 (×7): 50 ug via INTRAVENOUS

## 2013-07-24 MED ORDER — NITROFURANTOIN MONOHYD MACRO 100 MG PO CAPS
100.0000 mg | ORAL_CAPSULE | Freq: Every day | ORAL | Status: DC
  Administered 2013-07-24: 100 mg via ORAL
  Filled 2013-07-24 (×2): qty 1

## 2013-07-24 MED ORDER — FENTANYL CITRATE 0.05 MG/ML IJ SOLN
INTRAMUSCULAR | Status: AC
  Filled 2013-07-24: qty 5

## 2013-07-24 MED ORDER — ONDANSETRON HCL 4 MG/2ML IJ SOLN
INTRAMUSCULAR | Status: DC | PRN
  Administered 2013-07-24: 4 mg via INTRAVENOUS

## 2013-07-24 MED ORDER — FINASTERIDE 5 MG PO TABS
5.0000 mg | ORAL_TABLET | Freq: Every day | ORAL | Status: DC
  Administered 2013-07-24 – 2013-07-25 (×2): 5 mg via ORAL
  Filled 2013-07-24 (×2): qty 1

## 2013-07-24 MED ORDER — PROPOFOL 10 MG/ML IV BOLUS
INTRAVENOUS | Status: DC | PRN
  Administered 2013-07-24: 150 mg via INTRAVENOUS

## 2013-07-24 MED ORDER — BELLADONNA ALKALOIDS-OPIUM 16.2-60 MG RE SUPP
RECTAL | Status: DC | PRN
  Administered 2013-07-24: 1 via RECTAL

## 2013-07-24 MED ORDER — PROPOFOL 10 MG/ML IV BOLUS
INTRAVENOUS | Status: AC
Start: 2013-07-24 — End: 2013-07-24
  Filled 2013-07-24: qty 20

## 2013-07-24 MED ORDER — HYDROMORPHONE HCL PF 1 MG/ML IJ SOLN
INTRAMUSCULAR | Status: AC
  Filled 2013-07-24: qty 1

## 2013-07-24 MED ORDER — LABETALOL HCL 5 MG/ML IV SOLN
INTRAVENOUS | Status: DC | PRN
  Administered 2013-07-24 (×4): 5 mg via INTRAVENOUS

## 2013-07-24 MED ORDER — IOHEXOL 300 MG/ML  SOLN
100.0000 mL | Freq: Once | INTRAMUSCULAR | Status: AC | PRN
  Administered 2013-07-24: 100 mL via INTRAVENOUS

## 2013-07-24 MED ORDER — DEXAMETHASONE SODIUM PHOSPHATE 10 MG/ML IJ SOLN
INTRAMUSCULAR | Status: DC | PRN
  Administered 2013-07-24: 10 mg via INTRAVENOUS

## 2013-07-24 MED ORDER — LABETALOL HCL 5 MG/ML IV SOLN
INTRAVENOUS | Status: AC
  Filled 2013-07-24: qty 4

## 2013-07-24 SURGICAL SUPPLY — 23 items
BAG URINE DRAINAGE (UROLOGICAL SUPPLIES) ×2 IMPLANT
BAG URO CATCHER STRL LF (DRAPE) ×2 IMPLANT
CATH FOLEY 3WAY 30CC 24FR (CATHETERS) ×1
CATH URTH STD 24FR FL 3W 2 (CATHETERS) ×1 IMPLANT
CLOTH BEACON ORANGE TIMEOUT ST (SAFETY) ×2 IMPLANT
DRAPE CAMERA CLOSED 9X96 (DRAPES) ×2 IMPLANT
ELECT BUTTON HF 24-28F 2 30DE (ELECTRODE) ×4 IMPLANT
ELECT LOOP MED HF 24F 12D (CUTTING LOOP) ×2 IMPLANT
ELECT LOOP MED HF 24F 12D CBL (CLIP) ×4 IMPLANT
ELECT REM PT RETURN 9FT ADLT (ELECTROSURGICAL) ×2
ELECT RESECT VAPORIZE 12D CBL (ELECTRODE) IMPLANT
ELECTRODE REM PT RTRN 9FT ADLT (ELECTROSURGICAL) ×1 IMPLANT
EVACUATOR MICROVAS BLADDER (UROLOGICAL SUPPLIES) ×2 IMPLANT
GLOVE BIOGEL M STRL SZ7.5 (GLOVE) ×2 IMPLANT
GOWN STRL REUS W/TWL LRG LVL3 (GOWN DISPOSABLE) ×2 IMPLANT
GOWN STRL REUS W/TWL XL LVL3 (GOWN DISPOSABLE) ×2 IMPLANT
HOLDER FOLEY CATH W/STRAP (MISCELLANEOUS) ×2 IMPLANT
KIT ASPIRATION TUBING (SET/KITS/TRAYS/PACK) IMPLANT
MANIFOLD NEPTUNE II (INSTRUMENTS) ×4 IMPLANT
PACK CYSTO (CUSTOM PROCEDURE TRAY) ×2 IMPLANT
SYR 30ML LL (SYRINGE) ×2 IMPLANT
SYRINGE IRR TOOMEY STRL 70CC (SYRINGE) ×2 IMPLANT
TUBING CONNECTING 10 (TUBING) ×2 IMPLANT

## 2013-07-24 NOTE — Progress Notes (Signed)
Patient ID: Bryan Floyd, male   DOB: [birth year], 63 y.o.   MRN: [id number]  I saw pt around 5 pm. He was doing well on CBI - urine clear.   I discussed with patient and wife nature r/b of tamsulosin and finasteride. We discussed FDA warnings regarding finasteride.

## 2013-07-24 NOTE — Anesthesia Preprocedure Evaluation (Addendum)
Anesthesia Evaluation  Patient identified by MRN, date of birth, ID band Patient awake    Reviewed: Allergy & Precautions, H&P , NPO status , Patient's Chart, lab work & pertinent test results  Airway Mallampati: II TM Distance: >3 FB Neck ROM: Full    Dental  (+) Edentulous Upper, Edentulous Lower   Pulmonary neg pulmonary ROS,  breath sounds clear to auscultation  Pulmonary exam normal       Cardiovascular hypertension, Pt. on medications and Pt. on home beta blockers Rhythm:Regular Rate:Normal     Neuro/Psych negative neurological ROS  negative psych ROS   GI/Hepatic negative GI ROS, Neg liver ROS,   Endo/Other  Morbid obesity  Renal/GU negative Renal ROS  negative genitourinary   Musculoskeletal negative musculoskeletal ROS (+)   Abdominal   Peds  Hematology negative hematology ROS (+) anemia ,   Anesthesia Other Findings   Reproductive/Obstetrics                          Anesthesia Physical Anesthesia Plan  ASA: II and emergent  Anesthesia Plan: General   Post-op Pain Management:    Induction: Intravenous  Airway Management Planned: Oral ETT  Additional Equipment:   Intra-op Plan:   Post-operative Plan: Extubation in OR  Informed Consent: I have reviewed the patients History and Physical, chart, labs and discussed the procedure including the risks, benefits and alternatives for the proposed anesthesia with the patient or authorized representative who has indicated his/her understanding and acceptance.   Dental advisory given  Plan Discussed with: CRNA  Anesthesia Plan Comments:         Anesthesia Quick Evaluation

## 2013-07-24 NOTE — Progress Notes (Signed)
I reviewed CT, large prostate otw benign. I should add discussed with pt last night may need staged procedure and alternatives such as open simple.   Spoke with OR - plan for procedure around 10 AM today.

## 2013-07-24 NOTE — Progress Notes (Signed)
Transported to OR for surgery

## 2013-07-24 NOTE — Progress Notes (Signed)
Patient ID: Bryan Floyd, male   DOB: 05-15-1950, 63 y.o.   MRN: [id number]   CBI continues to be dark red with clots. He has no complaints. No CP or SOB.   Discussed CT with pt and wife. Again discussed risk of bleeding, incontinence, ED, stricture among other with pt and need for staged procedure.   They elect to proceed with TURP.   CBC    Component Value Date/Time   WBC 6.1 07/24/2013 0540   WBC 5.1 07/16/2013 1652   RBC 3.39* 07/24/2013 0540   RBC 4.31* 07/16/2013 1652   HGB 10.3* 07/24/2013 0540   HGB 13.1* 07/16/2013 1652   HCT 30.9* 07/24/2013 0540   HCT 41.2* 07/16/2013 1652   PLT 322 07/24/2013 0540   MCV 91.2 07/24/2013 0540   MCV 95.7 07/16/2013 1652   MCH 30.4 07/24/2013 0540   MCH 30.4 07/16/2013 1652   MCHC 33.3 07/24/2013 0540   MCHC 31.8 07/16/2013 1652   RDW 14.9 07/24/2013 0540   LYMPHSABS 1.9 07/23/2013 0635   MONOABS 0.6 07/23/2013 0635   EOSABS 0.2 07/23/2013 0635   BASOSABS 0.0 07/23/2013 0635    BMET    Component Value Date/Time   NA 139 07/24/2013 0540   K 4.4 07/24/2013 0540   CL 102 07/24/2013 0540   CO2 27 07/24/2013 0540   GLUCOSE 112* 07/24/2013 0540   BUN 12 07/24/2013 0540   CREATININE 1.07 07/24/2013 0540   CREATININE 1.04 07/16/2013 1646   CALCIUM 9.2 07/24/2013 0540   GFRNONAA 72* 07/24/2013 0540   GFRAA 83* 07/24/2013 0540

## 2013-07-24 NOTE — Transfer of Care (Signed)
Immediate Anesthesia Transfer of Care Note  Patient: Bryan Floyd  Procedure(s) Performed: Procedure(s): CYSTOSCOPY (N/A) TRANSURETHRAL RESECTION OF THE PROSTATE (TURP), staged (N/A)  Patient Location: PACU  Anesthesia Type:General  Level of Consciousness: awake, alert  and patient cooperative  Airway & Oxygen Therapy: Patient Spontanous Breathing and Patient connected to face mask oxygen  Post-op Assessment: Report given to PACU RN and Post -op Vital signs reviewed and stable  Post vital signs: Reviewed and stable  Complications: No apparent anesthesia complications

## 2013-07-24 NOTE — Progress Notes (Signed)
Back from surgery, drinking ginger ale, CBI running, urine pink to clear

## 2013-07-24 NOTE — Progress Notes (Signed)
Patient seen and evaluated earlier this AM by my associate. Please refer to H and P regarding Assessment and Plan.  Urology on board.  Will reassess next am  [facility]

## 2013-07-24 NOTE — Discharge Instructions (Signed)
Transurethral Resection of the Prostate Care After Refer to this sheet in the next few weeks. These instructions provide you with information on caring for yourself after your procedure. Your caregiver also may give you specific instructions. Your treatment has been planned according to current medical practices, but complications sometimes occur. Call your caregiver if you have any problems or questions after your procedure. HOME CARE INSTRUCTIONS Foley Catheter Care, Adult A Foley catheter is a soft, flexible tube. This tube is placed into your bladder to drain pee (urine). If you go home with this catheter in place, follow the instructions below. TAKING CARE OF THE CATHETER Wash your hands with soap and water. Put soap and water on a clean washcloth. Clean the skin where the tube goes into your body. Clean away from the tube site. Never wipe toward the tube. Clean the area using a circular motion. Remove all the soap. Pat the area dry with a clean towel. For males, reposition the skin that covers the end of the penis (foreskin). Attach the tube to your leg with tape or a leg strap. Do not stretch the tube tight. If you are using tape, remove any stickiness left behind by past tape you used. Keep the drainage bag below your hips. Keep it off the floor. Check your tube during the day. Make sure it is working and draining. Make sure the tube does not curl, twist, or bend. Do not pull on the tube or try to take it out. TAKING CARE OF THE DRAINAGE BAGS You will have a large overnight drainage bag and a small leg bag. You may wear the overnight bag any time. Never wear the small bag at night. Follow the directions below. Emptying the Drainage Bag Empty your drainage bag when it is    full or at least 2 3 times a day. Wash your hands with soap and water. Keep the drainage bag below your hips. Hold the dirty bag over the toilet or clean container. Open the pour spout at the bottom of the bag.  Empty the pee into the toilet or container. Do not let the pour spout touch anything. Clean the pour spout with a gauze pad or cotton ball that has rubbing alcohol on it. Close the pour spout. Attach the bag to your leg with tape or a leg strap. Wash your hands well. Changing the Drainage Bag Change your bag once a month or sooner if it starts to smell or look dirty.  Wash your hands with soap and water. Pinch the rubber tube so that pee does not spill out. Disconnect the catheter tube from the drainage tube at the connection valve. Do not let the tubes touch anything. Clean the end of the catheter tube with an alcohol wipe. Clean the end of a the drainage tube with a different alcohol wipe. Connect the catheter tube to the drainage tube of the clean drainage bag. Attach the new bag to the leg with tape or a leg strap. Avoid attaching the new bag too tightly. Wash your hands well. Cleaning the Drainage Bag Wash your hands with soap and water. Wash the bag in warm, soapy water. Rinse the bag with warm water. Fill the bag with a mixture of white vinegar and water (1 c vinegar to 1 qt warm water [.2 L vinegar to 1 L warm water]). Close the bag and soak it for 30 minutes in a solution. Rinse the bag with warm water. Hang the bag to dry with the  pour spout open and hanging downward. Store the clean bag (once it is dry) in a clean plastic bag. Wash your hands well. PREVENT INFECTION Wash your hands before and after touching your tube. Take showers every day. Wash the skin where the tube enters your body. Do not take baths. Replace wet leg straps with dry ones, if this applies. Do not use powders, sprays, or lotions on the genital area. Only use creams, lotions, or ointments as told by your doctor. For females, wipe from front to back after going to the bathroom. Drink enough fluids to keep your pee clear or pale yellow unless you are told not to have too much fluid (fluid restriction). Do not  let the drainage bag or tubing touch or lie on the floor. Wear cotton underwear to keep the area dry. GET HELP RIGHT AWAY IF: You have pain, puffiness (swelling), redness, or yellowish-white fluid (pus) where the tube enters the body. You have pain in the belly (abdomen), legs, lower back, or bladder. You have a fever. You see blood fill the tube, or your pee is pink or red. You feel sick to your stomach (nauseous), throw up (vomit), or have chills. Your tube gets pulled out. Your pee is cloudy or smells unusually bad. Your tube becomes clogged. You are not draining pee into the bag or your bladder feels full. Your tube starts to leak. MAKE SURE YOU:  Understand these instructions. Will watch your condition. Will get help right away if you are not doing well or get worse. Document Released: 06/07/2012 Document Reviewed: 06/07/2012 [facility] Patient Information 2014 [place], [place].  Recovery can take 4 6 weeks. Avoid alcohol, caffeinated drinks, and spicy foods for 2 weeks after your procedure. Drink enough fluids to keep your urine clear or pale yellow. Urinate as soon as you feel the urge to do so. Do not try to hold your urine for long periods of time. During recovery you may experience pain caused by bladder spasms, which result in a very intense urge to urinate. Take all medicines as directed by your caregiver, including medicines for pain. Try to limit the amount of pain medicines you take because it can cause constipation. If you do become constipated, do not strain to move your bowels. Straining can increase bleeding. Constipation can be minimized by increasing the amount fluids and fiber in your diet. Your caregiver also may prescribe a stool softener. Do not lift heavy objects (more than 5 lb [2.25 kg]) or perform exercises that cause you to strain for at least 1 month after your procedure. When sitting, you may want to sit in a soft chair or use a cushion. For the first 10 days after  your procedure, avoid the following activities:  Running.  Strenuous work.  Long walks.  Riding in a car for extended periods.  Sex. SEEK MEDICAL CARE IF:  You have difficulty urinating.  You have blood in your urine that does not go away after you rest or increase your fluid intake.  You have swelling in your penis or scrotum. SEEK IMMEDIATE MEDICAL CARE IF:   You are suddenly unable to urinate.  You notice blood clots in your urine.  You have chills.  You have a fever.  You have pain in your back or lower abdomen.  You have pain or swelling in your legs. MAKE SURE YOU:   Understand these instructions.  Will watch your condition.  Will get help right away if you are not doing well or  get worse. Document Released: 02/10/2005 Document Revised: 11/05/2011 Document Reviewed: 03/21/2011 [facility] Patient Information 2014 [place].

## 2013-07-24 NOTE — Op Note (Signed)
Preoperative diagnosis: BPH, urinary retention, gross hematuria Postoperative diagnosis: Same  Procedure: Exam under anesthesia Cystoscopy TURP, staged  Surgeon: Junious Silk  Anesthesia: Gen.  Findings:  Exam under anesthesia revealed a penis without lesions, the testicles were descended bilaterally and without mass. On digital rectal exam the prostate was 3+, smooth without hard area or nodule. Benign exam.  On cystoscopy there was a massive median lobe, obstructing lateral lobes, no stone in the bladder but lots of clot, trigone and ureteral orifices not visualized. The bladder mucosa appeared normal but cystoscopy would not been diagnostic.  Description of procedure: After consent was obtained patient brought to the operating room. After adequate anesthesia he was placed in lithotomy position. An exam under anesthesia. He was prepped and draped in the usual sterile fashion. Cystoscope was passed per urethra and there was a large amount of clot in the bladder. The cystoscope was removed. The resectoscope was placed in all the clot was evacuated with a Toomey syringe. It was evident that the primary obstruction was a massive median lobe although the lateral lobes did touch and obstructed as well. The median lobe was almost bilobed with a right lobe being two thirds of the volume and a smaller lobe toward the left. Therefore started and resected the smaller median lobe on the left and in a triangular pattern with the base of the triangle being at the bladder neck  And the apex of the triangle down toward the veru, I brought this down into the typical groove at 5:00 down to the veru. I did not resect a significant depth because it was obvious the trigone and ureteral orifices were well under this large median lobe. But this was enough depth to create a good channel. Next the massive remaining median lobe was tackled and I first started by going over to the 7:00 position in creating a groove between the  median lobe and the right lateral lobe and in the usual fashion brought this down to the veru. I then began the task of resecting this median lobe and took it a few centimeters of time starting on the right and the left slowly progressing down toward the incisions that met near the veru. Constantly checking the depth of the initial 5 and 7:00 resections and checking the bladder to ensure there remained out of the picture. This was a very vascular lobe and at times required concentrated effort on fulguration of certain areas to continue resection. Finally working back-and-forth slowly from the bladder neck down to the veru I was able to get a significant portion of the median lobe resected. At about an hour and 45 minutes I removed the loop and placed the button. I vaporize some but mainly fulgurated. In between inspection with the button the chips were evacuated with the [facility] evacuator. There was good hemostasis and no chips. Therefore the scope was removed and a 24 [place] three-way Foley catheter was placed. The balloon was inflated with 30 cc and seated at the bladder neck. Irrigated clear. There was equal return throughout the case and each time I used the Ellick. The catheter irrigated normally with equal return. It was connected to CBI and and a moderate drip remain clear. He was awakened and taken to recovery room in stable condition.  Complications none  Blood loss 100 mL  Specimens: TURP chips  Drains: 24 French three-way Foley to CBI  Disposition: Patient stable to PACU.

## 2013-07-24 NOTE — Progress Notes (Signed)
Hand irrigated with several clots returned.

## 2013-07-25 ENCOUNTER — Encounter (HOSPITAL_COMMUNITY): Payer: Self-pay | Admitting: Urology

## 2013-07-25 LAB — BASIC METABOLIC PANEL
BUN: 10 mg/dL (ref 6–23)
CO2: 23 mEq/L (ref 19–32)
Calcium: 9 mg/dL (ref 8.4–10.5)
Chloride: 105 mEq/L (ref 96–112)
Creatinine, Ser: 0.98 mg/dL (ref 0.50–1.35)
GFR calc Af Amer: >90 mL/min (ref >90)
GFR calc non Af Amer: 86 mL/min — ABNORMAL LOW (ref >90)
Glucose, Bld: 116 mg/dL — ABNORMAL HIGH (ref 70–99)
Potassium: 3.9 mEq/L (ref 3.7–5.3)
Sodium: 139 mEq/L (ref 137–147)

## 2013-07-25 LAB — CBC
HCT: 24.9 % — ABNORMAL LOW (ref 39.0–52.0)
Hemoglobin: 8.9 g/dL — ABNORMAL LOW (ref 13.0–17.0)
MCH: 32.4 pg (ref 26.0–34.0)
MCHC: 35.7 g/dL (ref 30.0–36.0)
MCV: 90.5 fL (ref 78.0–100.0)
Platelets: 303 10*3/uL (ref 150–400)
RBC: 2.75 MIL/uL — ABNORMAL LOW (ref 4.22–5.81)
RDW: 14.7 % (ref 11.5–15.5)
WBC: 9.1 10*3/uL (ref 4.0–10.5)

## 2013-07-25 MED ORDER — NITROFURANTOIN MONOHYD MACRO 100 MG PO CAPS: 100.0000 mg | ORAL_CAPSULE | Freq: Every day | ORAL | Status: DC

## 2013-07-25 MED ORDER — FINASTERIDE 5 MG PO TABS: 5.0000 mg | ORAL_TABLET | Freq: Every day | ORAL | Status: AC

## 2013-07-25 MED ORDER — OXYCODONE-ACETAMINOPHEN 5-325 MG PO TABS: 1.0000 | ORAL_TABLET | ORAL | Status: DC | PRN

## 2013-07-25 MED ORDER — TAMSULOSIN HCL 0.4 MG PO CAPS: 0.4000 mg | ORAL_CAPSULE | Freq: Every day | ORAL | Status: AC

## 2013-07-25 NOTE — Progress Notes (Signed)
Talked to patient about DCP; PCP is Dr Lucienne Capers but he does not like him and plans to go to the [facility] for follow up medical care; Pharmacy of choice is [facility] and patient stated that he does not have any problems getting his medication and will get his prescriptions filled the day of discharge; No needs identifiedMindi Slicker RN,BSN,MHA [phone]

## 2013-07-25 NOTE — Discharge Summary (Signed)
Physician Discharge Summary  Bryan Floyd NKN:[id number] DOB: May 24, 1950 DOA: 07/23/2013  PCP: Cathlean Cower, MD  Admit date: 07/23/2013 Discharge date: 07/25/2013  Time spent: > 35 minutes  Recommendations for Outpatient Follow-up:  1. Please be sure to follow up with your urologist after discharge. Call their office to confirm appointment date.  Discharge Diagnoses:  Principal Problem:   Hematuria, gross Active Problems:   Acute blood loss anemia   UTI (lower urinary tract infection)   Discharge Condition: stable  Diet recommendation: Low sodium heart healthy  Filed Weights   07/24/13 0015  Weight: 109.226 kg (240 lb 12.8 oz)    History of present illness:  From original history of present illness Bryan Floyd is a 63 y.o. male developed urinary retention 1 week ago, went to UC and foley catheter was placed. 2L drained at that time, 3 days ago developed gross hematuria, came to ED this morning due to having clotted off   Hospital Course:  Patient was evaluated by urology secondary to BPH and gross hematuria. Urologist evaluated and made the following recommendations: A/p - BPH, gross hematuria, urinary retention - s/p TURP, staged  -d/c CBI  -d/c home with foley to gravity  -d/c with tamsulosin, finasteride, nitrofurantoin 100 mg po qhs, colace, pain meds.    Procedures:  As listed above  Consultations:  Urology: Dr. Junious Silk  Discharge Exam: Filed Vitals:   07/25/13 0942  BP: 145/67  Pulse: 101  Temp:   Resp:     General: Pt in NAD, alert and awake Cardiovascular: RRR, no MRG Respiratory: CTA BL, no wheezes  Discharge Instructions You were cared for by a hospitalist during your hospital stay. If you have any questions about your discharge medications or the care you received while you were in the hospital after you are discharged, you can call the unit and asked to speak with the hospitalist on call if the hospitalist that took care of you is  not available. Once you are discharged, your primary care physician will handle any further medical issues. Please note that NO REFILLS for any discharge medications will be authorized once you are discharged, as it is imperative that you return to your primary care physician (or establish a relationship with a primary care physician if you do not have one) for your aftercare needs so that they can reassess your need for medications and monitor your lab values.  Discharge Instructions   Diet - low sodium heart healthy    Complete by:  As directed      Discharge instructions    Complete by:  As directed   Follow up with urologist in 1-2 weeks or as indicated per your conversation with them while in the hospital.     Increase activity slowly    Complete by:  As directed             Medication List    STOP taking these medications       cefixime 400 MG tablet  Commonly known as:  SUPRAX     ciprofloxacin 500 MG tablet  Commonly known as:  CIPRO     UNABLE TO FIND      TAKE these medications       aspirin 81 MG tablet  Take 81 mg by mouth daily.     atenolol 50 MG tablet  Commonly known as:  TENORMIN  Take 50 mg by mouth daily.     cyclobenzaprine 10 MG tablet  Commonly known as:  FLEXERIL  Take 10 mg by mouth 3 (three) times daily as needed for muscle spasms.     finasteride 5 MG tablet  Commonly known as:  PROSCAR  Take 1 tablet (5 mg total) by mouth daily.     multivitamin with minerals Tabs tablet  Take 1 tablet by mouth daily.     nitrofurantoin (macrocrystal-monohydrate) 100 MG capsule  Commonly known as:  MACROBID  Take 1 capsule (100 mg total) by mouth at bedtime.     oxyCODONE-acetaminophen 5-325 MG per tablet  Commonly known as:  PERCOCET/ROXICET  Take 1-2 tablets by mouth every 4 (four) hours as needed for moderate pain or severe pain.     tamsulosin 0.4 MG Caps capsule  Commonly known as:  FLOMAX  Take 1 capsule (0.4 mg total) by mouth daily after  supper.     Travoprost (BAK Free) 0.004 % Soln ophthalmic solution  Commonly known as:  TRAVATAN  Place 1 drop into both eyes at bedtime.       No Known Allergies     Follow-up Information   Follow up with Festus Aloe, MD. (AS SCHEDULED)    Specialty:  Urology   Contact information:   [place] [address] [phone]        The results of significant diagnostics from this hospitalization (including imaging, microbiology, ancillary and laboratory) are listed below for reference.    Significant Diagnostic Studies: Ct Abdomen Pelvis W Contrast  07/24/2013   CLINICAL DATA:  Persistent hematuria.  EXAM: CT ABDOMEN AND PELVIS WITH CONTRAST  TECHNIQUE: Multidetector CT imaging of the abdomen and pelvis was performed using the standard protocol following bolus administration of intravenous contrast.  CONTRAST:  146mL OMNIPAQUE IOHEXOL 300 MG/ML  SOLN  COMPARISON:  None.  FINDINGS: BODY WALL: Small fatty periumbilical hernia.  LOWER CHEST: Unremarkable.  ABDOMEN/PELVIS:  Liver: Multiple circumscribed low-density liver lesions, likely cysts. The largest is in the caudate lobe measuring 2.7 cm.  Biliary: No evidence of biliary obstruction or stone.  Pancreas: Unremarkable.  Spleen: Unremarkable.  Adrenals: Unremarkable.  Kidneys and ureters: Bilateral low dense renal cortical lesions consistent with cysts. The largest is exophytic from the upper pole right kidney and 18 mm. No evidence of solid mass. No hydronephrosis or calculus.  Bladder: The bladder is predominant decompressed by a Foley catheter. Portions which are not collapsed contain homogeneous high density, most likely clot. There is evidence of bladder wall thickening diffusely ; no focal enhancing mass identified.  Reproductive: The prostate is markedly enlarged, up to 9 cm, with heterogeneous nodular enhancement. The central gland extends superiorly to deform the bladder base. Negative seminal vesicles.  Bowel: No  obstruction. Normal appendix.  Retroperitoneum: No mass or adenopathy.  Peritoneum: No free fluid or gas.  Vascular: No acute abnormality.  OSSEOUS: Remote L1 superior endplate fracture/Schmorl's node. Bilateral hip osteoarthritis with subchondral cyst formation.  IMPRESSION: 1. Extensive clot within the urinary bladder, limiting urothelial evaluation. There is diffuse bladder wall thickening, increasing the importance of follow-up cystoscopy. 2. Marked prostate enlargement. 3. No upper urinary tract abnormality to explain hematuria.   Electronically Signed   By: Jorje Guild M.D.   On: 07/24/2013 00:54    Microbiology: Recent Results (from the past 240 hour(s))  URINE CULTURE     Status: None   Collection Time    07/16/13  4:47 PM      Result Value Ref Range Status   Colony Count NO GROWTH   Final   Organism  ID, Bacteria NO GROWTH   Final  URINE CULTURE     Status: None   Collection Time    07/23/13  4:02 AM      Result Value Ref Range Status   Specimen Description URINE, CATHETERIZED   Final   Special Requests ADD [id number]   Final   Culture  Setup Time     Final   Value: 07/23/2013 22:59     Performed at [facility] Count     Final   Value: NO GROWTH     Performed at [facility]   Culture     Final   Value: NO GROWTH     Performed at [facility]   Report Status 07/24/2013 FINAL   Final  SURGICAL PCR SCREEN     Status: None   Collection Time    07/24/13  1:58 AM      Result Value Ref Range Status   MRSA, PCR NEGATIVE  NEGATIVE Final   Staphylococcus aureus NEGATIVE  NEGATIVE Final   Comment:            The Xpert SA Assay (FDA     approved for NASAL specimens     in patients over 32 years of age),     is one component of     a comprehensive surveillance     program.  Test performance has     been validated by [employer] for patients greater     than or equal to 85 year old.     It is not intended     to diagnose infection nor  to     guide or monitor treatment.     Labs: Basic Metabolic Panel:  Recent Labs Lab 07/23/13 0648 07/23/13 2211 07/24/13 0540 07/24/13 1422 07/25/13 0513  NA 141 136* 139 138 139  K 4.0 4.1 4.4 4.0 3.9  CL 98 98 102 104 105  CO2  --  24 27 25 23   GLUCOSE 109* 108* 112* 129* 116*  BUN 11 13 12 12 10   CREATININE 1.10 1.06 1.07 1.15 0.98  CALCIUM  --  9.5 9.2 8.9 9.0   Liver Function Tests: No results found for this basename: AST, ALT, ALKPHOS, BILITOT, PROT, ALBUMIN,  in the last 168 hours No results found for this basename: LIPASE, AMYLASE,  in the last 168 hours No results found for this basename: AMMONIA,  in the last 168 hours CBC:  Recent Labs Lab 07/23/13 0635 07/23/13 0648 07/23/13 2211 07/24/13 0540 07/24/13 1422 07/25/13 0513  WBC 5.4  --  7.6 6.1 10.4 9.1  NEUTROABS 2.8  --   --   --   --   --   HGB 12.2* 13.9 11.4* 10.3* 9.6* 8.9*  HCT 35.0* 41.0 32.7* 30.9* 28.5* 24.9*  MCV 90.4  --  89.6 91.2 91.1 90.5  PLT 359  --  363 322 290 303   Cardiac Enzymes: No results found for this basename: CKTOTAL, CKMB, CKMBINDEX, TROPONINI,  in the last 168 hours BNP: BNP (last 3 results) No results found for this basename: PROBNP,  in the last 8760 hours CBG: No results found for this basename: GLUCAP,  in the last 168 hours     Signed:  Velvet Bathe  [facility] 07/25/2013, 2:13 PM

## 2013-07-25 NOTE — Progress Notes (Signed)
Patient ID: Bryan Floyd, male   DOB: 04-08-1950, 63 y.o.   MRN: [id number]   Pt without complaints.   Urine clear on slow CBI gtt.   A/p - BPH, gross hematuria, urinary retention - s/p TURP, staged  -d/c CBI -d/c home with foley to gravity -d/c with tamsulosin, finasteride, nitrofurantoin 100 mg po qhs, colace, pain meds.

## 2013-07-27 NOTE — Anesthesia Postprocedure Evaluation (Signed)
Anesthesia Post Note  Patient: Bryan Floyd  Procedure(s) Performed: Procedure(s) (LRB): CYSTOSCOPY (N/A) TRANSURETHRAL RESECTION OF THE PROSTATE (TURP), staged (N/A)  Anesthesia type: General  Patient location: PACU  Post pain: Pain level controlled  Post assessment: Post-op Vital signs reviewed  Last Vitals:  Filed Vitals:   07/25/13 0942  BP: 145/67  Pulse: 101  Temp:   Resp:     Post vital signs: Reviewed  Level of consciousness: sedated  Complications: No apparent anesthesia complications

## 2013-09-22 ENCOUNTER — Ambulatory Visit: Payer: Self-pay | Admitting: Gastroenterology

## 2015-04-29 ENCOUNTER — Ambulatory Visit (INDEPENDENT_AMBULATORY_CARE_PROVIDER_SITE_OTHER): Payer: PPO | Admitting: Urgent Care

## 2015-04-29 ENCOUNTER — Ambulatory Visit (INDEPENDENT_AMBULATORY_CARE_PROVIDER_SITE_OTHER): Payer: PPO

## 2015-04-29 VITALS — BP 130/82 | HR 58 | Temp 98.1°F | Resp 18 | Ht 69.25 in | Wt 258.4 lb

## 2015-04-29 DIAGNOSIS — M79652 Pain in left thigh: Secondary | ICD-10-CM | POA: Diagnosis not present

## 2015-04-29 DIAGNOSIS — M5442 Lumbago with sciatica, left side: Secondary | ICD-10-CM

## 2015-04-29 DIAGNOSIS — M545 Low back pain, unspecified: Secondary | ICD-10-CM

## 2015-04-29 DIAGNOSIS — M5136 Other intervertebral disc degeneration, lumbar region: Secondary | ICD-10-CM | POA: Diagnosis not present

## 2015-04-29 MED ORDER — CYCLOBENZAPRINE HCL 10 MG PO TABS: 10.0000 mg | ORAL_TABLET | Freq: Three times a day (TID) | ORAL | Status: DC | PRN

## 2015-04-29 MED ORDER — MELOXICAM 7.5 MG PO TABS: 7.5000 mg | ORAL_TABLET | Freq: Every day | ORAL | Status: DC

## 2015-04-29 NOTE — Progress Notes (Signed)
    MRN: [id number] DOB: June 13, 1950  Subjective:   Bryan Floyd is a 65 y.o. male presenting for chief complaint of Sciatica  Reports ~2 month history of low back pain that radiates into his left leg. Patient has longstanding history of chronic back pain. He has not had any specific work up or consistent follow up. Today, he reports that his back pain has never radiated into his left leg but it has been pretty consistent, occuring every day and started while he was on a run. He has previously used Flexeril with good results but ran out of this medication. 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 Denies fever, n/v, abdominal pain, constipation, bloody stools, dysuria, hematuria, trauma, history of back surgery.   [place]ir has a current medication list which includes the following prescription(s): aspirin, atenolol, finasteride, losartan, multivitamin with minerals, tamsulosin, travoprost (bak free), and cyclobenzaprine. Also has No Known Allergies.  Manson  has a past medical history of Glaucoma and Hypertension. Also  has past surgical history that includes Eye surgery; Cystoscopy (N/A, 07/24/2013); and Transurethral resection of prostate (N/A, 07/24/2013).  Objective:   Vitals: BP 130/82 mmHg  Pulse 58  Temp(Src) 98.1 F (36.7 C) (Oral)  Resp 18  Ht 5' 9.25" (1.759 m)  Wt 258 lb 6 oz (117.198 kg)  BMI 37.88 kg/m2  SpO2 98%  Physical Exam  Constitutional: He is oriented to person, place, and time. He appears well-developed and well-nourished.  Cardiovascular: Normal rate, regular rhythm and intact distal pulses.  Exam reveals no gallop and no friction rub.   No murmur heard. Pulmonary/Chest: No respiratory distress. He has no wheezes. He has no rales.  Abdominal: Soft. Bowel sounds are normal. He exhibits no distension and no mass. There is no  tenderness.  Musculoskeletal:       Lumbar back: He exhibits normal range of motion, no tenderness, no bony tenderness, no swelling, no edema, no deformity and no spasm.       Left upper leg: He exhibits tenderness (along lateral aspect of his leg). He exhibits no bony tenderness, no swelling, no edema, no deformity and no laceration.       Legs: Neurological: He is alert and oriented to person, place, and time. He has normal reflexes. Coordination normal.  Skin: Skin is warm and dry.   Dg Lumbar Spine Complete  04/29/2015  CLINICAL DATA:  Low back pain. EXAM: LUMBAR SPINE - COMPLETE 4+ VIEW COMPARISON:  07/24/2013 FINDINGS: There is no evidence of acute lumbar spine fracture. Remote superior endplate fracture deformity involving the L1 vertebra is unchanged. Alignment is normal. Mild disc space narrowing and ventral endplate spurring noted within the lumbar spine. IMPRESSION: 1. No acute findings. 2. Remote L1 superior endplate fracture. Electronically Signed   By: Kerby Moors M.D.   On: 04/29/2015 12:32    Assessment and Plan :   1. Midline low back pain without sciatica 2. Pain of left thigh - Recommended conservative management. Refilled flexeril, use meloxicam for pain and inflammation. RTC in 2 weeks if no improvement.  Jaynee Eagles, PA-C [facility] [phone] 04/29/2015 12:01 PM

## 2015-04-29 NOTE — Patient Instructions (Signed)
Back Pain, Adult °Back pain is very common in adults. The cause of back pain is rarely dangerous and the pain often gets better over time. The cause of your back pain may not be known. Some common causes of back pain include: °· Strain of the muscles or ligaments supporting the spine. °· Wear and tear (degeneration) of the spinal disks. °· Arthritis. °· Direct injury to the back. °For many people, back pain may return. Since back pain is rarely dangerous, most people can learn to manage this condition on their own. °HOME CARE INSTRUCTIONS °Watch your back pain for any changes. The following actions may help to lessen any discomfort you are feeling: °· Remain active. It is stressful on your back to sit or stand in one place for long periods of time. Do not sit, drive, or stand in one place for more than 30 minutes at a time. Take short walks on even surfaces as soon as you are able. Try to increase the length of time you walk each day. °· Exercise regularly as directed by your health care provider. Exercise helps your back heal faster. It also helps avoid future injury by keeping your muscles strong and flexible. °· Do not stay in bed. Resting more than 1-2 days can delay your recovery. °· Pay attention to your body when you bend and lift. The most comfortable positions are those that put less stress on your recovering back. Always use proper lifting techniques, including: °· Bending your knees. °· Keeping the load close to your body. °· Avoiding twisting. °· Find a comfortable position to sleep. Use a firm mattress and lie on your side with your knees slightly bent. If you lie on your back, put a pillow under your knees. °· Avoid feeling anxious or stressed. Stress increases muscle tension and can worsen back pain. It is important to recognize when you are anxious or stressed and learn ways to manage it, such as with exercise. °· Take medicines only as directed by your health care provider. Over-the-counter  medicines to reduce pain and inflammation are often the most helpful. Your health care provider may prescribe muscle relaxant drugs. These medicines help dull your pain so you can more quickly return to your normal activities and healthy exercise. °· Apply ice to the injured area: °· Put ice in a plastic bag. °· Place a towel between your skin and the bag. °· Leave the ice on for 20 minutes, 2-3 times a day for the first 2-3 days. After that, ice and heat may be alternated to reduce pain and spasms. °· Maintain a healthy weight. Excess weight puts extra stress on your back and makes it difficult to maintain good posture. °SEEK MEDICAL CARE IF: °· You have pain that is not relieved with rest or medicine. °· You have increasing pain going down into the legs or buttocks. °· You have pain that does not improve in one week. °· You have night pain. °· You lose weight. °· You have a fever or chills. °SEEK IMMEDIATE MEDICAL CARE IF:  °· You develop new bowel or bladder control problems. °· You have unusual weakness or numbness in your arms or legs. °· You develop nausea or vomiting. °· You develop abdominal pain. °· You feel faint. °  °This information is not intended to replace advice given to you by your health care provider. Make sure you discuss any questions you have with your health care provider. °  °Document Released: 02/10/2005 Document Revised: 03/03/2014 Document Reviewed: 06/14/2013 °Elsevier Interactive Patient Education ©2016 Elsevier   Inc.    Iliotibial Band Syndrome Iliotibial band syndrome is pain in the outer, lower thigh. The pain is caused by an inflammation of the iliotibial band. This is a band of thick fibrous tissue that runs down the outside of the thigh. The iliotibial band begins at the hip. It extends to the outer side of the shin bone (tibia) just below the knee joint. The band works with the thigh muscles. Together they provide stability to the outside of the knee joint. Iliotibial band  syndrome occurs when there is inflammation to this band of tissue. This is typically due to over use and not due to an injury. The irritation usually occurs over the outside of the knee joint, at the the end of the thigh bone (femur). The iliotibial band crosses bone and muscle at this point. Between these structures is a cushioning sac (bursa). The bursa should make possible a smooth gliding motion. However, when inflamed, the iliotibial band does not glide easily. When inflamed, there is pain with motion of the knee. Usually the pain worsens with continued movement and the pain goes away with rest. This problem usually arises when there is a sudden increase in sports activities involving your legs. Running, and playing soccer or basketball are examples of activities causing this. Others who are prone to iliotibial band syndrome include individuals with mechanical problems such as leg length differences, abnormality of walking, bowed legs etc. HOME CARE INSTRUCTIONS   Apply ice to the injured area:  Put ice in a plastic bag.  Place a towel between your skin and the bag.  Leave the ice on for 20 minutes, 2-3 times a day.  Limit excessive training or eliminate training until pain goes away.  While pain is present, you may use gentle range of motion. Do not resume regular use until instructed by your health care provider. Begin use gradually. Do not increase activity to the point of pain. If pain does develop, decrease activity and continue the above measures. Gradually increase activities that do not cause discomfort. Do this until you finally achieve normal use.  Perform low-impact activities while pain is present. Wear proper footwear.  Only take over-the-counter or prescription medicines for pain, discomfort, or fever as directed by your health care provider. SEEK MEDICAL CARE IF:   Your pain increases or pain is not controlled with medications.  You develop new, unexplained symptoms, or an  increase of the symptoms that brought you to your health care provider.  Your pain and symptoms are not improving or are getting worse.   This information is not intended to replace advice given to you by your health care provider. Make sure you discuss any questions you have with your health care provider.   Document Released: 08/02/2001 Document Revised: 03/03/2014 Document Reviewed: 09/09/2012 Elsevier Interactive Patient Education [employer].

## 2015-05-18 DIAGNOSIS — Z6837 Body mass index (BMI) 37.0-37.9, adult: Secondary | ICD-10-CM | POA: Diagnosis not present

## 2015-05-18 DIAGNOSIS — Z0000 Encounter for general adult medical examination without abnormal findings: Secondary | ICD-10-CM | POA: Diagnosis not present

## 2015-06-29 DIAGNOSIS — G5603 Carpal tunnel syndrome, bilateral upper limbs: Secondary | ICD-10-CM | POA: Diagnosis not present

## 2015-06-29 DIAGNOSIS — N400 Enlarged prostate without lower urinary tract symptoms: Secondary | ICD-10-CM | POA: Diagnosis not present

## 2015-06-29 DIAGNOSIS — Z1211 Encounter for screening for malignant neoplasm of colon: Secondary | ICD-10-CM | POA: Diagnosis not present

## 2015-06-29 DIAGNOSIS — Z23 Encounter for immunization: Secondary | ICD-10-CM | POA: Diagnosis not present

## 2015-06-29 DIAGNOSIS — I10 Essential (primary) hypertension: Secondary | ICD-10-CM | POA: Diagnosis not present

## 2015-07-06 DIAGNOSIS — D709 Neutropenia, unspecified: Secondary | ICD-10-CM | POA: Diagnosis not present

## 2015-07-06 DIAGNOSIS — I10 Essential (primary) hypertension: Secondary | ICD-10-CM | POA: Diagnosis not present

## 2015-07-20 DIAGNOSIS — D709 Neutropenia, unspecified: Secondary | ICD-10-CM | POA: Diagnosis not present

## 2015-08-02 DIAGNOSIS — G5603 Carpal tunnel syndrome, bilateral upper limbs: Secondary | ICD-10-CM | POA: Diagnosis not present

## 2015-08-02 DIAGNOSIS — D709 Neutropenia, unspecified: Secondary | ICD-10-CM | POA: Diagnosis not present

## 2015-08-02 DIAGNOSIS — I10 Essential (primary) hypertension: Secondary | ICD-10-CM | POA: Diagnosis not present

## 2015-08-14 ENCOUNTER — Other Ambulatory Visit: Payer: Self-pay | Admitting: Gastroenterology

## 2015-08-14 DIAGNOSIS — D125 Benign neoplasm of sigmoid colon: Secondary | ICD-10-CM | POA: Diagnosis not present

## 2015-08-14 DIAGNOSIS — Z1211 Encounter for screening for malignant neoplasm of colon: Secondary | ICD-10-CM | POA: Diagnosis not present

## 2015-08-14 DIAGNOSIS — D122 Benign neoplasm of ascending colon: Secondary | ICD-10-CM | POA: Diagnosis not present

## 2015-08-14 DIAGNOSIS — D124 Benign neoplasm of descending colon: Secondary | ICD-10-CM | POA: Diagnosis not present

## 2015-08-14 DIAGNOSIS — K5730 Diverticulosis of large intestine without perforation or abscess without bleeding: Secondary | ICD-10-CM | POA: Diagnosis not present

## 2015-08-14 DIAGNOSIS — D120 Benign neoplasm of cecum: Secondary | ICD-10-CM | POA: Diagnosis not present

## 2015-08-14 DIAGNOSIS — D126 Benign neoplasm of colon, unspecified: Secondary | ICD-10-CM | POA: Diagnosis not present

## 2015-08-14 DIAGNOSIS — D123 Benign neoplasm of transverse colon: Secondary | ICD-10-CM | POA: Diagnosis not present

## 2015-09-17 ENCOUNTER — Ambulatory Visit (INDEPENDENT_AMBULATORY_CARE_PROVIDER_SITE_OTHER): Payer: PPO | Admitting: Urgent Care

## 2015-09-17 VITALS — BP 146/88 | HR 64 | Temp 98.1°F | Resp 17 | Ht 70.0 in | Wt 249.0 lb

## 2015-09-17 DIAGNOSIS — L98491 Non-pressure chronic ulcer of skin of other sites limited to breakdown of skin: Secondary | ICD-10-CM

## 2015-09-17 DIAGNOSIS — IMO0001 Reserved for inherently not codable concepts without codable children: Secondary | ICD-10-CM

## 2015-09-17 DIAGNOSIS — R030 Elevated blood-pressure reading, without diagnosis of hypertension: Secondary | ICD-10-CM | POA: Diagnosis not present

## 2015-09-17 DIAGNOSIS — I10 Essential (primary) hypertension: Secondary | ICD-10-CM | POA: Diagnosis not present

## 2015-09-17 DIAGNOSIS — R21 Rash and other nonspecific skin eruption: Secondary | ICD-10-CM | POA: Diagnosis not present

## 2015-09-17 LAB — POCT SKIN KOH: Skin KOH, POC: NEGATIVE

## 2015-09-17 MED ORDER — MUPIROCIN 2 % EX OINT: 1.0000 "application " | TOPICAL_OINTMENT | Freq: Three times a day (TID) | CUTANEOUS | 1 refills | Status: DC

## 2015-09-17 MED ORDER — CEPHALEXIN 500 MG PO CAPS: 500.0000 mg | ORAL_CAPSULE | Freq: Three times a day (TID) | ORAL | 0 refills | Status: DC

## 2015-09-17 NOTE — Progress Notes (Signed)
    MRN: [id number] DOB: [birth year]  Subjective:   Bryan Floyd is a 65 y.o. male presenting for chief complaint of Hand Injury (right side) and Hypertension  Hand - Reports 4 day history of right hand wound. He has a history of ring worm over his hand near thumb. States the used a pressure washer with Clorox this weekend and feels that it irritated his skin. Reports that he felt he started to have pus drain. Used peroxide over the wound. Denies fever, redness, swelling, numbness or tingling.   HTN - Managed with valsartan-HCTZ, Currently managed by Dr. Dema Severin with [facility]. Follow up is planned in 4 weeks. Denies lightheadedness, dizziness, chronic headache, blurred vision, chest pain, shortness of breath, heart racing, palpitations, nausea, vomiting, abdominal pain, hematuria, lower leg swelling. Denies smoking cigarettes. Drinks 6 pack/week.   [name] has a current medication list which includes the following prescription(s): amlodipine, aspirin, brimonidine-timolol, cyclobenzaprine, finasteride, multivitamin with minerals, naproxen, tamsulosin, travoprost (bak free), and valsartan-hydrochlorothiazide. Also has No Known Allergies.  [name]  has a past medical history of Glaucoma and Hypertension. Also  has a past surgical history that includes Eye surgery; Cystoscopy (N/A, 07/24/2013); and Transurethral resection of prostate (N/A, 07/24/2013).  His family history includes Cancer in his father; Heart disease in his father; Stroke in his father.   Objective:   Vitals: BP (!) 148/94 (BP Location: Right Arm, Patient Position: Sitting, Cuff Size: Normal)   Pulse 64   Temp 98.1 F (36.7 C) (Oral)   Resp 17   Ht 5\' 10"  (1.778 m)   Wt 249 lb (112.9 kg)   SpO2 100%   BMI 35.73 kg/m   Physical Exam  Constitutional: He is oriented to person, place, and time. He appears well-developed and well-nourished.  HENT:  Mouth/Throat: Oropharynx is clear and moist.  Eyes: Pupils are equal,  round, and reactive to light. No scleral icterus.  Cardiovascular: Normal rate, regular rhythm and intact distal pulses.  Exam reveals no gallop and no friction rub.   No murmur heard. Pulmonary/Chest: No respiratory distress. He has no wheezes. He has no rales.  Neurological: He is alert and oriented to person, place, and time.  Skin:      Results for orders placed or performed in visit on 09/17/15 (from the past 24 hour(s))  POCT Skin KOH     Status: None   Collection Time: 09/17/15 11:29 AM  Result Value Ref Range   Skin KOH, POC Negative     Assessment and Plan :   1. Skin ulcer, limited to breakdown of skin ([place]) 2. Rash and nonspecific skin eruption - Will cover for superficial bacterial infection with mupirocin and Keflex. Patient is to stop using peroxide over his wound.  3. Elevated blood pressure 4. Essential hypertension - F/u with PCP regarding HTN management. Discussed warning signs of HTN urgency although I suspect this is unlikely.  Jaynee Eagles, PA-C [facility] [phone] 09/17/2015 11:01 AM

## 2015-09-17 NOTE — Patient Instructions (Signed)
     IF you received an x-ray today, you will receive an invoice from [facility]. Please contact [facility] at [phone] with questions or concerns regarding your invoice.   IF you received labwork today, you will receive an invoice from [facility]. Please contact [facility] at [phone] with questions or concerns regarding your invoice.   Our billing staff will not be able to assist you with questions regarding bills from these companies.  You will be contacted with the lab results as soon as they are available. The fastest way to get your results is to activate your My Chart account. Instructions are located on the last page of this paperwork. If you have not heard from us regarding the results in 2 weeks, please contact this office.      

## 2015-10-05 ENCOUNTER — Other Ambulatory Visit: Payer: Self-pay | Admitting: Family Medicine

## 2015-10-05 DIAGNOSIS — I10 Essential (primary) hypertension: Secondary | ICD-10-CM | POA: Diagnosis not present

## 2015-10-05 DIAGNOSIS — D709 Neutropenia, unspecified: Secondary | ICD-10-CM | POA: Diagnosis not present

## 2015-10-05 DIAGNOSIS — E049 Nontoxic goiter, unspecified: Secondary | ICD-10-CM

## 2015-10-05 DIAGNOSIS — E010 Iodine-deficiency related diffuse (endemic) goiter: Secondary | ICD-10-CM | POA: Diagnosis not present

## 2015-10-05 DIAGNOSIS — E785 Hyperlipidemia, unspecified: Secondary | ICD-10-CM | POA: Diagnosis not present

## 2015-10-22 ENCOUNTER — Ambulatory Visit
Admission: RE | Admit: 2015-10-22 | Discharge: 2015-10-22 | Disposition: A | Discharge status: Home / Self Care | Payer: PPO | Source: Ambulatory Visit | Attending: Family Medicine | Admitting: Family Medicine

## 2015-10-22 DIAGNOSIS — E049 Nontoxic goiter, unspecified: Secondary | ICD-10-CM

## 2015-10-22 DIAGNOSIS — E042 Nontoxic multinodular goiter: Secondary | ICD-10-CM | POA: Diagnosis not present

## 2015-10-24 ENCOUNTER — Other Ambulatory Visit: Payer: Self-pay | Admitting: Family Medicine

## 2015-10-24 DIAGNOSIS — E041 Nontoxic single thyroid nodule: Secondary | ICD-10-CM

## 2015-11-08 ENCOUNTER — Other Ambulatory Visit (HOSPITAL_COMMUNITY)
Admission: RE | Admit: 2015-11-08 | Discharge: 2015-11-08 | Disposition: A | Discharge status: Home / Self Care | Payer: PPO | Source: Ambulatory Visit | Attending: Radiology | Admitting: Radiology

## 2015-11-08 ENCOUNTER — Ambulatory Visit
Admission: RE | Admit: 2015-11-08 | Discharge: 2015-11-08 | Disposition: A | Discharge status: Home / Self Care | Payer: PPO | Source: Ambulatory Visit | Attending: Family Medicine | Admitting: Family Medicine

## 2015-11-08 DIAGNOSIS — E041 Nontoxic single thyroid nodule: Secondary | ICD-10-CM

## 2015-11-12 DIAGNOSIS — H2513 Age-related nuclear cataract, bilateral: Secondary | ICD-10-CM | POA: Diagnosis not present

## 2015-11-12 DIAGNOSIS — H401133 Primary open-angle glaucoma, bilateral, severe stage: Secondary | ICD-10-CM | POA: Diagnosis not present

## 2015-12-06 DIAGNOSIS — N400 Enlarged prostate without lower urinary tract symptoms: Secondary | ICD-10-CM | POA: Diagnosis not present

## 2016-01-25 DIAGNOSIS — E785 Hyperlipidemia, unspecified: Secondary | ICD-10-CM | POA: Diagnosis not present

## 2016-01-25 DIAGNOSIS — I10 Essential (primary) hypertension: Secondary | ICD-10-CM | POA: Diagnosis not present

## 2016-01-25 DIAGNOSIS — D709 Neutropenia, unspecified: Secondary | ICD-10-CM | POA: Diagnosis not present

## 2016-01-25 DIAGNOSIS — L309 Dermatitis, unspecified: Secondary | ICD-10-CM | POA: Diagnosis not present

## 2016-03-06 ENCOUNTER — Ambulatory Visit (INDEPENDENT_AMBULATORY_CARE_PROVIDER_SITE_OTHER): Payer: PPO | Admitting: Physician Assistant

## 2016-03-06 ENCOUNTER — Ambulatory Visit (INDEPENDENT_AMBULATORY_CARE_PROVIDER_SITE_OTHER): Payer: PPO

## 2016-03-06 VITALS — BP 150/90 | HR 66 | Temp 98.6°F | Resp 16 | Ht 69.0 in | Wt 253.6 lb

## 2016-03-06 DIAGNOSIS — K635 Polyp of colon: Secondary | ICD-10-CM | POA: Insufficient documentation

## 2016-03-06 DIAGNOSIS — M25532 Pain in left wrist: Secondary | ICD-10-CM

## 2016-03-06 DIAGNOSIS — M19032 Primary osteoarthritis, left wrist: Secondary | ICD-10-CM | POA: Diagnosis not present

## 2016-03-06 DIAGNOSIS — N400 Enlarged prostate without lower urinary tract symptoms: Secondary | ICD-10-CM | POA: Insufficient documentation

## 2016-03-06 NOTE — Progress Notes (Signed)
Patient ID: Bryan Floyd, male    DOB: [birth year], 66 y.o.   MRN: [id number]  PCP: Cathlean Cower, MD  Chief Complaint  Patient presents with  . Left hand    per pt "carpal tunnel"    Subjective:   Presents for evaluation of left hand pain.  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 He states that he was diagnosed with carpal tunnel by his primary care physician (Dr. Lorie Phenix) this past Summer and, at that time, was given a brace to wear at night. He states that he has had worsening pain in the past few weeks and his wife encouraged him to seek additional treatment. His pain is located at the carpometocarpal joint of the left thumb and is associated with numbness and tingling in his middle three fingers, especially at night. He states that he has recently been helping a friend renovate a home and has been "knocking down walls, painting, and plastering". He states that he has been wearing his splint every night. Pt is left handed.   Review of Systems Const: Denies fever, chills, fatigue, or wt changes. Pulm: Denies cough or SOB. CV: Denies chest pain or palpitations. MSK: Admits to some transient pain and numbness of the right hand.   Patient Active Problem List   Diagnosis Date Noted  . Hematuria, gross 07/23/2013  . Acute blood loss anemia 07/23/2013  . UTI (lower urinary tract infection) 07/23/2013     Prior to Admission medications   Medication Sig Start Date End Date Taking? Authorizing Provider  amLODipine (NORVASC) 10 MG tablet Take 10 mg by mouth daily.   Yes Historical Provider, MD  aspirin 81 MG tablet Take 81 mg by mouth daily.   Yes Historical Provider, MD  brimonidine-timolol (COMBIGAN) 0.2-0.5 % ophthalmic solution Place 1 drop into both eyes every 12 (twelve) hours.   Yes Historical Provider, MD  cyclobenzaprine (FLEXERIL) 10 MG tablet Take 1 tablet (10 mg total) by mouth 3 (three) times daily as needed for  muscle spasms. Reported on 04/29/2015 04/29/15  Yes Jaynee Eagles, PA-C  finasteride (PROSCAR) 5 MG tablet Take 1 tablet (5 mg total) by mouth daily. 07/25/13  Yes Velvet Bathe, MD  metoprolol succinate (TOPROL-XL) 25 MG 24 hr tablet Take 25 mg by mouth daily.   Yes Historical Provider, MD  Multiple Vitamin (MULTIVITAMIN WITH MINERALS) TABS tablet Take 1 tablet by mouth daily.   Yes Historical Provider, MD  naproxen (NAPROSYN) 500 MG tablet Take 500 mg by mouth 2 (two) times daily with a meal.   Yes Historical Provider, MD  rosuvastatin (CRESTOR) 5 MG tablet Take 5 mg by mouth daily.   Yes Historical Provider, MD  tamsulosin (FLOMAX) 0.4 MG CAPS capsule Take 1 capsule (0.4 mg total) by mouth daily after supper. 07/25/13  Yes Velvet Bathe, MD  Travoprost, BAK Free, (TRAVATAN) 0.004 % SOLN ophthalmic solution Place 1 drop into both eyes at bedtime.   Yes Historical Provider, MD  valsartan-hydrochlorothiazide (DIOVAN-HCT) 320-25 MG tablet Take 1 tablet by mouth daily.   Yes Historical Provider, MD  mupirocin ointment (BACTROBAN) 2 % Apply 1 application topically 3 (three) times daily. Patient not taking: Reported on 03/06/2016 09/17/15   Jaynee Eagles, PA-C     No Known Allergies     Objective:  Physical Exam Pulm: Good respiratory effort. CTAB; no wheezes, rales, or rhonchi. CV: RRR, no M/R/G; 2+ radial pulses bilaterally. MSK: Tenderness  to palpation of carpometacarpal joint of left thumb and thenar eminence.         Assessment & Plan:   1. Left wrist pain Pt advised to begin wearing his wrist splint during the day as well as at night and to avoid overworking his left hand. Pt also advised to take his anti-inflamatory on a daily basis for the next week. Pt advised that if his symptoms continue and he wants to seek further treatment, he can contact his primary care provider for a referral to a hand surgeon.  - DG Wrist Complete Left; Future   Lorella Nimrod, PA-S

## 2016-03-06 NOTE — Progress Notes (Signed)
Patient ID: Bryan Floyd, male    DOB: 11/20/1950, 66 y.o.   MRN: [id number]  PCP: Vidal Schwalbe, MD  Chief Complaint  Patient presents with  . Left hand    per pt "carpal tunnel"    Subjective:   Presents for evaluation of LEFT hand/wrist pain.  He is LEFT hand dominant.  Patient reports seeing his PCP this past summer for these symptoms and being diagnosed with Carpal Tunnel Syndrome. He was supplied with a splint, which includes a thumb spica by his description, and had complete resolution of his symptoms using it at night. Unable to access Care Everywhere. Reports that he has been wearing the thumb spica splint every night since seeing his PCP.  The past several weeks he has had recurrent pain. He has been helping a friend renovate a small house. Has used Aleve here and there when the pain gets bad.   The pain is primarily at the base of the LEFT thumb, and is associated with tingling in the fingers. No weakness, not dropping things.  Review of Systems As above.    Patient Active Problem List   Diagnosis Date Noted  . BPH (benign prostatic hyperplasia) 03/06/2016  . Colon polyps 03/06/2016     Prior to Admission medications   Medication Sig Start Date End Date Taking? Authorizing Provider  amLODipine (NORVASC) 10 MG tablet Take 10 mg by mouth daily.   Yes Historical Provider, MD  aspirin 81 MG tablet Take 81 mg by mouth daily.   Yes Historical Provider, MD  brimonidine-timolol (COMBIGAN) 0.2-0.5 % ophthalmic solution Place 1 drop into both eyes every 12 (twelve) hours.   Yes Historical Provider, MD  cyclobenzaprine (FLEXERIL) 10 MG tablet Take 1 tablet (10 mg total) by mouth 3 (three) times daily as needed for muscle spasms. Reported on 04/29/2015 04/29/15  Yes Jaynee Eagles, PA-C  finasteride (PROSCAR) 5 MG tablet Take 1 tablet (5 mg total) by mouth daily. 07/25/13  Yes Velvet Bathe, MD  metoprolol succinate (TOPROL-XL) 25 MG 24 hr tablet Take 25 mg by mouth  daily.   Yes Historical Provider, MD  Multiple Vitamin (MULTIVITAMIN WITH MINERALS) TABS tablet Take 1 tablet by mouth daily.   Yes Historical Provider, MD  naproxen (NAPROSYN) 500 MG tablet Take 500 mg by mouth 2 (two) times daily with a meal.   Yes Historical Provider, MD  rosuvastatin (CRESTOR) 5 MG tablet Take 5 mg by mouth daily.   Yes Historical Provider, MD  tamsulosin (FLOMAX) 0.4 MG CAPS capsule Take 1 capsule (0.4 mg total) by mouth daily after supper. 07/25/13  Yes Velvet Bathe, MD  Travoprost, BAK Free, (TRAVATAN) 0.004 % SOLN ophthalmic solution Place 1 drop into both eyes at bedtime.   Yes Historical Provider, MD  valsartan-hydrochlorothiazide (DIOVAN-HCT) 320-25 MG tablet Take 1 tablet by mouth daily.   Yes Historical Provider, MD  mupirocin ointment (BACTROBAN) 2 % Apply 1 application topically 3 (three) times daily. Patient not taking: Reported on 03/06/2016 09/17/15   Jaynee Eagles, PA-C     No Known Allergies     Objective:  Physical Exam  Constitutional: He is oriented to person, place, and time. He appears well-developed and well-nourished. He is active and cooperative. No distress.  BP (!) 150/90 (BP Location: Left Arm, Cuff Size: Large)   Pulse 66   Temp 98.6 F (37 C) (Oral)   Resp 16   Ht 5\' 9"  (1.753 m)   Wt 253 lb 9.6 oz (115 kg)  SpO2 100%   BMI 37.45 kg/m   HENT:  Head: Normocephalic and atraumatic.  Right Ear: Hearing normal.  Left Ear: Hearing normal.  Eyes: Conjunctivae are normal. No scleral icterus.  Neck: Normal range of motion. Neck supple. No thyromegaly present.  Cardiovascular: Normal rate, regular rhythm and normal heart sounds.   Pulses:      Radial pulses are 2+ on the right side, and 2+ on the left side.  Pulmonary/Chest: Effort normal and breath sounds normal.  Musculoskeletal:       Right wrist: Normal.       Left wrist: He exhibits tenderness and bony tenderness. He exhibits normal range of motion, no swelling, no effusion, no crepitus,  no deformity and no laceration.       Left forearm: Normal.       Arms:      Right hand: Normal.       Left hand: He exhibits tenderness and bony tenderness. He exhibits normal range of motion, normal capillary refill, no deformity, no laceration and no swelling. Normal sensation noted. Normal strength noted.  Lymphadenopathy:       Head (right side): No tonsillar, no preauricular, no posterior auricular and no occipital adenopathy present.       Head (left side): No tonsillar, no preauricular, no posterior auricular and no occipital adenopathy present.    He has no cervical adenopathy.       Right: No supraclavicular adenopathy present.       Left: No supraclavicular adenopathy present.  Neurological: He is alert and oriented to person, place, and time. No sensory deficit.  Skin: Skin is warm, dry and intact. Lesion (dry, peeling skin on the hands, worst along the thumb and webspace between the thumb and index fingers) noted. No rash noted. No cyanosis or erythema. Nails show no clubbing.  Psychiatric: He has a normal mood and affect. His speech is normal and behavior is normal.   Dg Wrist Complete Left  Result Date: 03/06/2016 CLINICAL DATA:  Left thumb and wrist pain.  No known injury. EXAM: LEFT WRIST - COMPLETE 3+ VIEW COMPARISON:  None. FINDINGS: No acute bony or joint abnormality is identified. Mild first CMC osteoarthritis. Rounded calcification projecting in the dorsal soft tissues of the wrist measures 0.4 cm in diameter. Soft tissues are otherwise unremarkable. IMPRESSION: No acute abnormality. Mild first CMC osteoarthritis. 4 mm round calcification the dorsal soft tissues of the wrist could be due to old trauma. Electronically Signed   By: Inge Rise M.D.   On: 03/06/2016 12:01           Assessment & Plan:   1. Left wrist pain CTS vs DeQuervain's. COntinue wrist/spica splint. Wear it during the day as able. Minimize unnecessary hand/wrist activities. OK to use NSAIDS  regularly x 1 week. If no improvement, recommend hand specialty evaluation. - DG Wrist Complete Left; Future   Fara Chute, PA-C Physician [facility]

## 2016-03-06 NOTE — Patient Instructions (Addendum)
Continue the splint. Consider wearing it during the day in addition to night time while you are having this increased pain.  Contact Dr. Dema Severin if the pain persists and you'd like to see a hand specialist.       IF you received an x-ray today, you will receive an invoice from [facility]. Please contact [facility] at [phone] with questions or concerns regarding your invoice.   IF you received labwork today, you will receive an invoice from [facility]. Please contact [facility] at [phone] with questions or concerns regarding your invoice.   Our billing staff will not be able to assist you with questions regarding bills from these companies.  You will be contacted with the lab results as soon as they are available. The fastest way to get your results is to activate your My Chart account. Instructions are located on the last page of this paperwork. If you have not heard from [place] regarding the results in 2 weeks, please contact this office.

## 2016-03-18 ENCOUNTER — Other Ambulatory Visit: Payer: Self-pay | Admitting: Gastroenterology

## 2016-03-24 ENCOUNTER — Encounter (HOSPITAL_COMMUNITY): Payer: Self-pay

## 2016-03-25 ENCOUNTER — Ambulatory Visit (HOSPITAL_COMMUNITY)
Admission: RE | Admit: 2016-03-25 | Discharge: 2016-03-25 | Disposition: A | Discharge status: Home / Self Care | Payer: PPO | Source: Ambulatory Visit | Attending: Gastroenterology | Admitting: Gastroenterology

## 2016-03-25 ENCOUNTER — Ambulatory Visit (HOSPITAL_COMMUNITY): Payer: PPO | Admitting: Certified Registered Nurse Anesthetist

## 2016-03-25 ENCOUNTER — Encounter (HOSPITAL_COMMUNITY): Payer: Self-pay

## 2016-03-25 ENCOUNTER — Encounter (HOSPITAL_COMMUNITY)
Admission: RE | Disposition: A | Discharge status: Home / Self Care | Payer: Self-pay | Source: Ambulatory Visit | Attending: Gastroenterology

## 2016-03-25 DIAGNOSIS — D124 Benign neoplasm of descending colon: Secondary | ICD-10-CM | POA: Insufficient documentation

## 2016-03-25 DIAGNOSIS — K5730 Diverticulosis of large intestine without perforation or abscess without bleeding: Secondary | ICD-10-CM | POA: Diagnosis not present

## 2016-03-25 DIAGNOSIS — K621 Rectal polyp: Secondary | ICD-10-CM | POA: Diagnosis not present

## 2016-03-25 DIAGNOSIS — N400 Enlarged prostate without lower urinary tract symptoms: Secondary | ICD-10-CM | POA: Diagnosis not present

## 2016-03-25 DIAGNOSIS — I10 Essential (primary) hypertension: Secondary | ICD-10-CM | POA: Diagnosis not present

## 2016-03-25 DIAGNOSIS — D120 Benign neoplasm of cecum: Secondary | ICD-10-CM | POA: Insufficient documentation

## 2016-03-25 DIAGNOSIS — Z79899 Other long term (current) drug therapy: Secondary | ICD-10-CM | POA: Diagnosis not present

## 2016-03-25 DIAGNOSIS — D128 Benign neoplasm of rectum: Secondary | ICD-10-CM | POA: Insufficient documentation

## 2016-03-25 DIAGNOSIS — D123 Benign neoplasm of transverse colon: Secondary | ICD-10-CM | POA: Diagnosis not present

## 2016-03-25 DIAGNOSIS — K635 Polyp of colon: Secondary | ICD-10-CM | POA: Diagnosis not present

## 2016-03-25 DIAGNOSIS — Z7982 Long term (current) use of aspirin: Secondary | ICD-10-CM | POA: Diagnosis not present

## 2016-03-25 DIAGNOSIS — D3A022 Benign carcinoid tumor of the ascending colon: Secondary | ICD-10-CM | POA: Diagnosis not present

## 2016-03-25 HISTORY — PX: COLONOSCOPY WITH PROPOFOL: SHX5780

## 2016-03-25 HISTORY — PX: HOT HEMOSTASIS: SHX5433

## 2016-03-25 SURGERY — COLONOSCOPY WITH PROPOFOL
Anesthesia: Monitor Anesthesia Care

## 2016-03-25 MED ORDER — PROPOFOL 500 MG/50ML IV EMUL
INTRAVENOUS | Status: DC | PRN
  Administered 2016-03-25: 100 ug/kg/min via INTRAVENOUS

## 2016-03-25 MED ORDER — PROPOFOL 10 MG/ML IV BOLUS
INTRAVENOUS | Status: AC
  Filled 2016-03-25: qty 20

## 2016-03-25 MED ORDER — PROPOFOL 10 MG/ML IV BOLUS
INTRAVENOUS | Status: DC | PRN
  Administered 2016-03-25 (×2): 20 mg via INTRAVENOUS
  Administered 2016-03-25: 60 mg via INTRAVENOUS
  Administered 2016-03-25: 20 mg via INTRAVENOUS

## 2016-03-25 MED ORDER — LACTATED RINGERS IV SOLN
INTRAVENOUS | Status: DC | PRN
  Administered 2016-03-25: 13:00:00 via INTRAVENOUS

## 2016-03-25 MED ORDER — SODIUM CHLORIDE 0.9 % IV SOLN: INTRAVENOUS | Status: DC

## 2016-03-25 MED ORDER — LIDOCAINE 2% (20 MG/ML) 5 ML SYRINGE
INTRAMUSCULAR | Status: AC
  Filled 2016-03-25: qty 10

## 2016-03-25 MED ORDER — LIDOCAINE 2% (20 MG/ML) 5 ML SYRINGE
INTRAMUSCULAR | Status: DC | PRN
  Administered 2016-03-25: 120 mg via INTRAVENOUS

## 2016-03-25 SURGICAL SUPPLY — 21 items

## 2016-03-25 NOTE — Progress Notes (Signed)
Bryan Floyd 12:42 PM  Subjective: Patient without any GI complaints since we last saw him and no new medical problems  Objective: Vital signs stable afebrile exam please see preassessment evaluation  Assessment: Difficult to remove colon polyps  Plan: Okay to proceed with colonoscopy with anesthesia assistance  [facility]  Pager [phone] After 5PM or if no answer call [phone]

## 2016-03-25 NOTE — Anesthesia Preprocedure Evaluation (Signed)
Anesthesia Evaluation  Patient identified by MRN, date of birth, ID band Patient awake    Reviewed: Allergy & Precautions, NPO status , Patient's Chart, lab work & pertinent test results  Airway Mallampati: II  TM Distance: >3 FB Neck ROM: Full    Dental no notable dental hx.    Pulmonary neg pulmonary ROS,    Pulmonary exam normal breath sounds clear to auscultation       Cardiovascular hypertension, Normal cardiovascular exam Rhythm:Regular Rate:Normal     Neuro/Psych negative neurological ROS  negative psych ROS   GI/Hepatic negative GI ROS, Neg liver ROS,   Endo/Other  negative endocrine ROS  Renal/GU negative Renal ROS  negative genitourinary   Musculoskeletal negative musculoskeletal ROS (+)   Abdominal   Peds negative pediatric ROS (+)  Hematology negative hematology ROS (+)   Anesthesia Other Findings   Reproductive/Obstetrics negative OB ROS                             Anesthesia Physical Anesthesia Plan  ASA: II  Anesthesia Plan: MAC   Post-op Pain Management:    Induction: Intravenous  Airway Management Planned: Nasal Cannula  Additional Equipment:   Intra-op Plan:   Post-operative Plan:   Informed Consent: I have reviewed the patients History and Physical, chart, labs and discussed the procedure including the risks, benefits and alternatives for the proposed anesthesia with the patient or authorized representative who has indicated his/her understanding and acceptance.   Dental advisory given  Plan Discussed with: CRNA and Surgeon  Anesthesia Plan Comments:         Anesthesia Quick Evaluation  

## 2016-03-25 NOTE — Op Note (Signed)
[facility] Patient Name: Bryan Floyd Procedure Date: 03/25/2016 MRN: [id number] Attending MD: Clarene Essex , MD Date of Birth: Jun 19, 1950 CSN: [id number] Age: 66 Admit Type: Outpatient Procedure:                Colonoscopy Indications:              Adenomatous polyps in the colon difficult to remove Providers:                Clarene Essex, MD, Hilma Favors, RN, Corliss Parish,                            Technician Referring MD:              Medicines:                Propofol total dose 980 mg IV120 mg IV lidocaine Complications:            No immediate complications. Estimated Blood Loss:     Estimated blood loss: none. Procedure:                Pre-Anesthesia Assessment:                           - Prior to the procedure, a History and Physical                            was performed, and patient medications and                            allergies were reviewed. The patient's tolerance of                            previous anesthesia was also reviewed. The risks                            and benefits of the procedure and the sedation                            options and risks were discussed with the patient.                            All questions were answered, and informed consent                            was obtained. Prior Anticoagulants: The patient has                            taken aspirin, last dose was 2 days prior to                            procedure. ASA Grade Assessment: II - A patient                            with mild systemic disease. After reviewing the  risks and benefits, the patient was deemed in                            satisfactory condition to undergo the procedure.                           After obtaining informed consent, the colonoscope                            was passed under direct vision. Throughout the                            procedure, the patient's blood pressure, pulse, and                      oxygen saturations were monitored continuously. The                            [id number] scope was introduced through                            the anus and advanced to the the cecum, identified                            by appendiceal orifice and ileocecal valve. The                            colonoscopy was technically difficult and complex                            due to multiple large polyps. The patient tolerated                            the procedure well. The quality of the bowel                            preparation was adequate. The ileocecal valve,                            appendiceal orifice, and rectum were photographed. Scope In: 1:17:15 PM Scope Out: 2:12:40 PM Scope Withdrawal Time: 0 hours 51 minutes 45 seconds  Total Procedure Duration: 0 hours 55 minutes 25 seconds  Findings:      A few small-mouthed diverticula were found in the sigmoid colon.      Four semi-sessile polyps were found in the rectum, descending colon and       mid transverse colon. The polyps were small in size. These polyps were       removed with a hot snare. Resection and retrieval were complete.      A medium polyp was found in the cecum. The polyp was semi-sessile. The       polyp was removed with a piecemeal technique using a hot snare.       Resection and retrieval were complete.      A large polyp was found in the mid ascending colon by tatoo. The polyp  was semi-sessile. The polyp was removed with a piecemeal technique using       a hot snare. Polyp resection was incomplete. The resected tissue was       retrieved.      A large polyp was found in the splenic flexure by other tatoo. The polyp       was semi-sessile. The polyp was removed with a piecemeal technique using       a hot snare. Polyp resection was incomplete. The resected tissue was       retrieved.      The exam was otherwise without abnormality. Impression:               - Diverticulosis  in the sigmoid colon.                           - Four small polyps in the rectum, in the                            descending colon and in the mid transverse colon,                            removed with a hot snare. Resected and retrieved.                           - One medium polyp in the cecum, removed piecemeal                            using a hot snare. Resected and retrieved.                           - One large polyp in the mid ascending colon,                            removed piecemeal using a hot snare. Incomplete                            resection. Resected tissue retrieved.                           - One large polyp at the splenic flexure, removed                            piecemeal using a hot snare. Incomplete resection.                            Resected tissue retrieved.                           - The examination was otherwise normal. Moderate Sedation:      N/A- Per Anesthesia Care Recommendation:           - Patient has a contact number available for                            emergencies. The signs and symptoms of potential  delayed complications were discussed with the                            patient. Return to normal activities tomorrow.                            Written discharge instructions were provided to the                            patient.                           - Soft diet today.                           - No aspirin, ibuprofen, naproxen, or other                            non-steroidal anti-inflammatory drugs for 3 days                            after polyp removal.                           - Await pathology results.                           - Repeat colonoscopy date to be determined after                            pending pathology results are reviewed for                            surveillance based on pathology results.                           - Return to GI office PRN.                            - Telephone GI clinic for pathology results in 1                            week.                           - Telephone GI clinic if symptomatic PRN. Procedure Code(s):        --- Professional ---                           [phone], Colonoscopy, flexible; with removal of                            tumor(s), polyp(s), or other lesion(s) by snare                            technique Diagnosis Code(s):        ---  Professional ---                           K62.1, Rectal polyp                           D12.4, Benign neoplasm of descending colon                           D12.3, Benign neoplasm of transverse colon (hepatic                            flexure or splenic flexure)                           D12.0, Benign neoplasm of cecum                           D12.2, Benign neoplasm of ascending colon                           D12.6, Benign neoplasm of colon, unspecified                           K57.30, Diverticulosis of large intestine without                            perforation or abscess without bleeding CPT copyright 2016 [employer]. All rights reserved. The codes documented in this report are preliminary and upon coder review may  be revised to meet current compliance requirements. Clarene Essex, MD 03/25/2016 2:23:02 PM This report has been signed electronically. Number of Addenda: 0

## 2016-03-25 NOTE — Transfer of Care (Signed)
Immediate Anesthesia Transfer of Care Note  Patient: Liahm Grivas  Procedure(s) Performed: Procedure(s): COLONOSCOPY WITH PROPOFOL (N/A) HOT HEMOSTASIS (ARGON PLASMA COAGULATION/BICAP) (N/A)  Patient Location: PACU  Anesthesia Type:MAC  Level of Consciousness: Patient easily awoken, sedated, comfortable, cooperative, following commands, responds to stimulation.   Airway & Oxygen Therapy: Patient spontaneously breathing, ventilating well, oxygen via simple oxygen mask.  Post-op Assessment: Report given to PACU RN, vital signs reviewed and stable, moving all extremities.   Post vital signs: Reviewed and stable.  Complications: No apparent anesthesia complications Last Vitals:  Vitals:   03/25/16 1231  BP: (!) 174/81  Pulse: (!) 58  Resp: 11  Temp: 36.6 C    Last Pain:  Vitals:   03/25/16 1231  TempSrc: Oral         Complications: No apparent anesthesia complications

## 2016-03-25 NOTE — Discharge Instructions (Signed)
YOU HAD AN ENDOSCOPIC PROCEDURE TODAY: Refer to the procedure report and other information in the discharge instructions given to you for any specific questions about what was found during the examination. If this information does not answer your questions, please call [facility] office at [phone] to clarify.   YOU SHOULD EXPECT: Some feelings of bloating in the abdomen. Passage of more gas than usual. Walking can help get rid of the air that was put into your GI tract during the procedure and reduce the bloating. If you had a lower endoscopy (such as a colonoscopy or flexible sigmoidoscopy) you may notice spotting of blood in your stool or on the toilet paper. Some abdominal soreness may be present for a day or two, also.  DIET: Your first meal following the procedure should be a light meal and then it is ok to progress to your normal diet. A half-sandwich or bowl of soup is an example of a good first meal. Heavy or fried foods are harder to digest and may make you feel nauseous or bloated. Drink plenty of fluids but you should avoid alcoholic beverages for 24 hours. If you had a esophageal dilation, please see attached instructions for diet.   ACTIVITY: Your care partner should take you home directly after the procedure. You should plan to take it easy, moving slowly for the rest of the day. You can resume normal activity the day after the procedure however YOU SHOULD NOT DRIVE, use power tools, machinery or perform tasks that involve climbing or major physical exertion for 24 hours (because of the sedation medicines used during the test).   SYMPTOMS TO REPORT IMMEDIATELY: A gastroenterologist can be reached at any hour. Please call [phone]  for any of the following symptoms:  Following lower endoscopy (colonoscopy, flexible sigmoidoscopy) Excessive amounts of blood in the stool  Significant tenderness, worsening of abdominal pains  Swelling of the abdomen that is new, acute  Fever of 100 or  higher  Following upper endoscopy (EGD, EUS, ERCP, esophageal dilation) Vomiting of blood or coffee ground material  New, significant abdominal pain  New, significant chest pain or pain under the shoulder blades  Painful or persistently difficult swallowing  New shortness of breath  Black, tarry-looking or red, bloody stools  FOLLOW UP:  If any biopsies were taken you will be contacted by phone or by letter within the next 1-3 weeks. Call [phone]  if you have not heard about the biopsies in 3 weeks.  Please also call with any specific questions about appointments or follow up tests. Call if question or problem otherwise resume aspirin on Friday and no anti-inflammatories until Friday like no Naprosyn but may have Tylenol as needed and call in 1 week for biopsy report and will decide how to proceed and future plans based on pathology

## 2016-03-26 ENCOUNTER — Encounter (HOSPITAL_COMMUNITY): Payer: Self-pay | Admitting: Gastroenterology

## 2016-03-26 NOTE — Anesthesia Postprocedure Evaluation (Addendum)
Anesthesia Post Note  Patient: Bryan Floyd  Procedure(s) Performed: Procedure(s) (LRB): COLONOSCOPY WITH PROPOFOL (N/A) HOT HEMOSTASIS (ARGON PLASMA COAGULATION/BICAP) (N/A)  Patient location during evaluation: PACU Anesthesia Type: MAC Level of consciousness: awake and alert Pain management: pain level controlled Vital Signs Assessment: post-procedure vital signs reviewed and stable Respiratory status: spontaneous breathing, nonlabored ventilation, respiratory function stable and patient connected to nasal cannula oxygen Cardiovascular status: stable and blood pressure returned to baseline Anesthetic complications: no       Last Vitals:  Vitals:   03/25/16 1450 03/25/16 1500  BP: (!) 141/90 (!) 170/91  Pulse:    Resp: 13 14  Temp:      Last Pain:  Vitals:   03/26/16 0858  TempSrc:   PainSc: 0-No pain                 [name] S

## 2016-04-11 DIAGNOSIS — D3A026 Benign carcinoid tumor of the rectum: Secondary | ICD-10-CM | POA: Diagnosis not present

## 2016-04-11 DIAGNOSIS — D126 Benign neoplasm of colon, unspecified: Secondary | ICD-10-CM | POA: Diagnosis not present

## 2016-04-11 DIAGNOSIS — D122 Benign neoplasm of ascending colon: Secondary | ICD-10-CM | POA: Diagnosis not present

## 2016-05-01 DIAGNOSIS — H401132 Primary open-angle glaucoma, bilateral, moderate stage: Secondary | ICD-10-CM | POA: Diagnosis not present

## 2016-05-01 DIAGNOSIS — H2513 Age-related nuclear cataract, bilateral: Secondary | ICD-10-CM | POA: Diagnosis not present

## 2016-05-20 DIAGNOSIS — D709 Neutropenia, unspecified: Secondary | ICD-10-CM | POA: Diagnosis not present

## 2016-05-20 DIAGNOSIS — I10 Essential (primary) hypertension: Secondary | ICD-10-CM | POA: Diagnosis not present

## 2016-05-20 DIAGNOSIS — E785 Hyperlipidemia, unspecified: Secondary | ICD-10-CM | POA: Diagnosis not present

## 2016-06-10 DIAGNOSIS — H401132 Primary open-angle glaucoma, bilateral, moderate stage: Secondary | ICD-10-CM | POA: Diagnosis not present

## 2016-07-28 NOTE — Addendum Note (Signed)
Addendum  created 07/28/16 1036 by Myrtie Soman, MD   Sign clinical note

## 2016-08-08 DIAGNOSIS — H2513 Age-related nuclear cataract, bilateral: Secondary | ICD-10-CM | POA: Diagnosis not present

## 2016-08-08 DIAGNOSIS — H401132 Primary open-angle glaucoma, bilateral, moderate stage: Secondary | ICD-10-CM | POA: Diagnosis not present

## 2016-08-08 DIAGNOSIS — H04123 Dry eye syndrome of bilateral lacrimal glands: Secondary | ICD-10-CM | POA: Diagnosis not present

## 2016-09-29 DIAGNOSIS — D3A026 Benign carcinoid tumor of the rectum: Secondary | ICD-10-CM | POA: Diagnosis not present

## 2016-09-29 DIAGNOSIS — D126 Benign neoplasm of colon, unspecified: Secondary | ICD-10-CM | POA: Diagnosis not present

## 2016-10-28 ENCOUNTER — Other Ambulatory Visit: Payer: Self-pay | Admitting: Family Medicine

## 2016-10-29 ENCOUNTER — Other Ambulatory Visit: Payer: Self-pay | Admitting: Family Medicine

## 2016-10-29 DIAGNOSIS — E041 Nontoxic single thyroid nodule: Secondary | ICD-10-CM

## 2016-11-05 ENCOUNTER — Other Ambulatory Visit: Payer: PPO

## 2016-11-12 ENCOUNTER — Ambulatory Visit
Admission: RE | Admit: 2016-11-12 | Discharge: 2016-11-12 | Disposition: A | Discharge status: Home / Self Care | Payer: PPO | Source: Ambulatory Visit | Attending: Family Medicine | Admitting: Family Medicine

## 2016-11-12 DIAGNOSIS — E041 Nontoxic single thyroid nodule: Secondary | ICD-10-CM

## 2016-11-12 DIAGNOSIS — E042 Nontoxic multinodular goiter: Secondary | ICD-10-CM | POA: Diagnosis not present

## 2016-12-09 DIAGNOSIS — D125 Benign neoplasm of sigmoid colon: Secondary | ICD-10-CM | POA: Diagnosis not present

## 2016-12-09 DIAGNOSIS — D120 Benign neoplasm of cecum: Secondary | ICD-10-CM | POA: Diagnosis not present

## 2016-12-09 DIAGNOSIS — M545 Low back pain: Secondary | ICD-10-CM | POA: Diagnosis not present

## 2016-12-09 DIAGNOSIS — K648 Other hemorrhoids: Secondary | ICD-10-CM | POA: Diagnosis not present

## 2016-12-09 DIAGNOSIS — I10 Essential (primary) hypertension: Secondary | ICD-10-CM | POA: Diagnosis not present

## 2016-12-09 DIAGNOSIS — R339 Retention of urine, unspecified: Secondary | ICD-10-CM | POA: Diagnosis not present

## 2016-12-09 DIAGNOSIS — D122 Benign neoplasm of ascending colon: Secondary | ICD-10-CM | POA: Diagnosis not present

## 2016-12-09 DIAGNOSIS — E119 Type 2 diabetes mellitus without complications: Secondary | ICD-10-CM | POA: Diagnosis not present

## 2016-12-09 DIAGNOSIS — K635 Polyp of colon: Secondary | ICD-10-CM | POA: Diagnosis not present

## 2016-12-09 DIAGNOSIS — Z79899 Other long term (current) drug therapy: Secondary | ICD-10-CM | POA: Diagnosis not present

## 2016-12-09 DIAGNOSIS — K621 Rectal polyp: Secondary | ICD-10-CM | POA: Diagnosis not present

## 2016-12-09 DIAGNOSIS — N401 Enlarged prostate with lower urinary tract symptoms: Secondary | ICD-10-CM | POA: Diagnosis not present

## 2016-12-09 DIAGNOSIS — H409 Unspecified glaucoma: Secondary | ICD-10-CM | POA: Diagnosis not present

## 2016-12-09 DIAGNOSIS — Z7982 Long term (current) use of aspirin: Secondary | ICD-10-CM | POA: Diagnosis not present

## 2016-12-09 DIAGNOSIS — D374 Neoplasm of uncertain behavior of colon: Secondary | ICD-10-CM | POA: Diagnosis not present

## 2016-12-22 DIAGNOSIS — B9789 Other viral agents as the cause of diseases classified elsewhere: Secondary | ICD-10-CM | POA: Diagnosis not present

## 2016-12-22 DIAGNOSIS — J069 Acute upper respiratory infection, unspecified: Secondary | ICD-10-CM | POA: Diagnosis not present

## 2016-12-24 DIAGNOSIS — N400 Enlarged prostate without lower urinary tract symptoms: Secondary | ICD-10-CM | POA: Diagnosis not present

## 2016-12-25 DIAGNOSIS — D709 Neutropenia, unspecified: Secondary | ICD-10-CM | POA: Diagnosis not present

## 2016-12-25 DIAGNOSIS — D126 Benign neoplasm of colon, unspecified: Secondary | ICD-10-CM | POA: Diagnosis not present

## 2016-12-25 DIAGNOSIS — D3A026 Benign carcinoid tumor of the rectum: Secondary | ICD-10-CM | POA: Diagnosis not present

## 2016-12-25 DIAGNOSIS — E010 Iodine-deficiency related diffuse (endemic) goiter: Secondary | ICD-10-CM | POA: Diagnosis not present

## 2016-12-25 DIAGNOSIS — E785 Hyperlipidemia, unspecified: Secondary | ICD-10-CM | POA: Diagnosis not present

## 2016-12-25 DIAGNOSIS — J0110 Acute frontal sinusitis, unspecified: Secondary | ICD-10-CM | POA: Diagnosis not present

## 2016-12-25 DIAGNOSIS — R509 Fever, unspecified: Secondary | ICD-10-CM | POA: Diagnosis not present

## 2016-12-25 DIAGNOSIS — I10 Essential (primary) hypertension: Secondary | ICD-10-CM | POA: Diagnosis not present

## 2016-12-25 DIAGNOSIS — Z0000 Encounter for general adult medical examination without abnormal findings: Secondary | ICD-10-CM | POA: Diagnosis not present

## 2016-12-31 DIAGNOSIS — R350 Frequency of micturition: Secondary | ICD-10-CM | POA: Diagnosis not present

## 2016-12-31 DIAGNOSIS — N400 Enlarged prostate without lower urinary tract symptoms: Secondary | ICD-10-CM | POA: Diagnosis not present

## 2017-01-07 DIAGNOSIS — Z23 Encounter for immunization: Secondary | ICD-10-CM | POA: Diagnosis not present

## 2017-01-20 DIAGNOSIS — C189 Malignant neoplasm of colon, unspecified: Secondary | ICD-10-CM | POA: Diagnosis not present

## 2017-01-22 ENCOUNTER — Other Ambulatory Visit: Payer: Self-pay | Admitting: Surgery

## 2017-01-22 DIAGNOSIS — C189 Malignant neoplasm of colon, unspecified: Secondary | ICD-10-CM

## 2017-01-28 DIAGNOSIS — I10 Essential (primary) hypertension: Secondary | ICD-10-CM | POA: Diagnosis not present

## 2017-01-28 DIAGNOSIS — Z01818 Encounter for other preprocedural examination: Secondary | ICD-10-CM | POA: Diagnosis not present

## 2017-01-28 DIAGNOSIS — Z23 Encounter for immunization: Secondary | ICD-10-CM | POA: Diagnosis not present

## 2017-01-29 ENCOUNTER — Ambulatory Visit
Admission: RE | Admit: 2017-01-29 | Discharge: 2017-01-29 | Disposition: A | Discharge status: Home / Self Care | Payer: PPO | Source: Ambulatory Visit | Attending: Surgery | Admitting: Surgery

## 2017-01-29 ENCOUNTER — Other Ambulatory Visit: Payer: PPO

## 2017-01-29 DIAGNOSIS — C189 Malignant neoplasm of colon, unspecified: Secondary | ICD-10-CM | POA: Diagnosis not present

## 2017-01-29 DIAGNOSIS — R911 Solitary pulmonary nodule: Secondary | ICD-10-CM | POA: Diagnosis not present

## 2017-01-29 MED ORDER — IOPAMIDOL (ISOVUE-300) INJECTION 61%
100.0000 mL | Freq: Once | INTRAVENOUS | Status: AC | PRN
  Administered 2017-01-29: 100 mL via INTRAVENOUS

## 2017-02-04 DIAGNOSIS — H6982 Other specified disorders of Eustachian tube, left ear: Secondary | ICD-10-CM | POA: Diagnosis not present

## 2017-02-06 ENCOUNTER — Other Ambulatory Visit: Payer: Self-pay

## 2017-02-06 ENCOUNTER — Encounter (HOSPITAL_COMMUNITY): Payer: Self-pay | Admitting: Emergency Medicine

## 2017-02-12 ENCOUNTER — Ambulatory Visit (HOSPITAL_COMMUNITY): Admission: RE | Admit: 2017-02-12 | Payer: PPO | Source: Ambulatory Visit | Admitting: Surgery

## 2017-02-12 SURGERY — COLONOSCOPY WITH PROPOFOL
Anesthesia: Monitor Anesthesia Care

## 2017-02-18 ENCOUNTER — Telehealth: Payer: Self-pay

## 2017-02-18 NOTE — Telephone Encounter (Signed)
Sent notes to scheduling 

## 2017-03-02 ENCOUNTER — Other Ambulatory Visit: Payer: Self-pay

## 2017-03-02 ENCOUNTER — Encounter: Payer: Self-pay | Admitting: Cardiology

## 2017-03-02 ENCOUNTER — Ambulatory Visit (INDEPENDENT_AMBULATORY_CARE_PROVIDER_SITE_OTHER): Payer: PPO | Admitting: Cardiology

## 2017-03-02 ENCOUNTER — Other Ambulatory Visit: Payer: Self-pay | Admitting: Cardiology

## 2017-03-02 VITALS — BP 126/70 | HR 52 | Ht 69.0 in | Wt 264.0 lb

## 2017-03-02 DIAGNOSIS — E782 Mixed hyperlipidemia: Secondary | ICD-10-CM | POA: Diagnosis not present

## 2017-03-02 DIAGNOSIS — I10 Essential (primary) hypertension: Secondary | ICD-10-CM | POA: Insufficient documentation

## 2017-03-02 DIAGNOSIS — Z01810 Encounter for preprocedural cardiovascular examination: Secondary | ICD-10-CM | POA: Diagnosis not present

## 2017-03-02 DIAGNOSIS — E663 Overweight: Secondary | ICD-10-CM | POA: Diagnosis not present

## 2017-03-02 NOTE — Progress Notes (Signed)
Cardiology Office Note:    Date:  03/02/2017   ID:  Warren Danes, DOB 1950/09/04, MRN [id number]  PCP:  Harlan Stains, MD  Cardiologist:  Jenean Lindau, MD   Referring MD: Harlan Stains, MD    ASSESSMENT:    1. Preop cardiovascular exam   2. Hypertension, unspecified type   3. Essential hypertension   4. Mixed dyslipidemia   5. Overweight    PLAN:    In order of problems listed above:  1.  Primary prevention stressed with the patient.  Importance of compliance with diet and medications stressed and he vocalized understanding. 2. He has significant issues with essential hypertension.  He has multiple risk factors for coronary artery disease and before he undergoes a significant surgery like colectomy I would like to assess him objectively with a exercise stress echo and he is agreeable for this.  I will keep him on a beta-blocker because of issues with elevated blood pressure so we do not have any challenges performing the test.  I discussed this with him and he vocalized understanding. 3.  The stress test is negative he is not at high risk for coronary events during the aforementioned surgery.  Meticulous hemodynamic monitoring and continue perioperative beta-blocker will further reduce the risk of coronary events. 4.  He is overweight and I discussed directly with him the risks of obesity and he vocalized understanding.  Follow-up appointment in a month or earlier if he has any concerns.   Medication Adjustments/Labs and Tests Ordered: Current medicines are reviewed at length with the patient today.  Concerns regarding medicines are outlined above.  Orders Placed This Encounter  Procedures  . EKG 12-Lead  . ECHOCARDIOGRAM STRESS TEST   No orders of the defined types were placed in this encounter.    History of Present Illness:    Bryan Floyd is a 67 y.o. male who is being seen today for the evaluation of preoperative assessment from a cardiovascular standpoint  at the request of Harlan Stains, MD.  Patient is a pleasant 67 year old married.  He is accompanied by his wife today.  He is a new patient to my practice.  He is planning to undergo colectomy surgery.  He denies any problems at this time.  He tells me that he is active and well ambulatory.  He has been diagnosed with precancerous colon polyps.  At the time of my evaluation, the patient is alert awake oriented and in no distress.  No chest pain orthopnea or PND.  He tells me that he walks up to half an hour on a regular basis.  He has history of essential hypertension and dyslipidemia.  There is no history of diabetes mellitus or any smoking history.  Past Medical History:  Diagnosis Date  . Glaucoma   . Hypertension     Past Surgical History:  Procedure Laterality Date  . COLONOSCOPY WITH PROPOFOL N/A 03/25/2016   Procedure: COLONOSCOPY WITH PROPOFOL;  Surgeon: Clarene Essex, MD;  Location: [facility];  Service: Endoscopy;  Laterality: N/A;  . CYSTOSCOPY N/A 07/24/2013   Procedure: CYSTOSCOPY;  Surgeon: Festus Aloe, MD;  Location: [facility];  Service: Urology;  Laterality: N/A;  . EYE SURGERY     laser  . HOT HEMOSTASIS N/A 03/25/2016   Procedure: HOT HEMOSTASIS (ARGON PLASMA COAGULATION/BICAP);  Surgeon: Clarene Essex, MD;  Location: [facility];  Service: Endoscopy;  Laterality: N/A;  . TRANSURETHRAL RESECTION OF PROSTATE N/A 07/24/2013   Procedure: TRANSURETHRAL RESECTION OF THE PROSTATE (TURP), staged;  Surgeon: Festus Aloe, MD;  Location: [facility];  Service: Urology;  Laterality: N/A;    Current Medications: Current Meds  Medication Sig  . amLODipine (NORVASC) 10 MG tablet Take 10 mg by mouth daily.  . brimonidine-timolol (COMBIGAN) 0.2-0.5 % ophthalmic solution Place 1 drop into both eyes every 12 (twelve) hours.  . cetirizine (ZYRTEC) 10 MG tablet Take 10 mg by mouth daily.  . cyclobenzaprine (FLEXERIL) 10 MG tablet Take 1 tablet (10 mg total) by mouth 3 (three) times daily as  needed for muscle spasms. Reported on 04/29/2015  . finasteride (PROSCAR) 5 MG tablet Take 1 tablet (5 mg total) by mouth daily.  . hydrALAZINE (APRESOLINE) 10 MG tablet   . ipratropium (ATROVENT) 0.06 % nasal spray   . metoprolol succinate (TOPROL-XL) 100 MG 24 hr tablet Take 200 mg by mouth daily. Take with or immediately following a meal.  . Multiple Vitamin (MULTIVITAMIN WITH MINERALS) TABS tablet Take 1 tablet by mouth daily.  . naproxen (NAPROSYN) 500 MG tablet Take 500 mg by mouth every 12 (twelve) hours as needed for moderate pain.   Marland Kitchen olmesartan-hydrochlorothiazide (BENICAR HCT) 40-25 MG tablet Take 1 tablet by mouth daily.  . rosuvastatin (CRESTOR) 5 MG tablet Take 5 mg by mouth daily at 6 PM.   . tamsulosin (FLOMAX) 0.4 MG CAPS capsule Take 1 capsule (0.4 mg total) by mouth daily after supper.  Marland Kitchen VYZULTA 0.024 % SOLN      Allergies:   Patient has no known allergies.   Social History   Socioeconomic History  . Marital status: Married    Spouse name: None  . Number of children: None  . Years of education: None  . Highest education level: None  Social Needs  . Financial resource strain: None  . Food insecurity - worry: None  . Food insecurity - inability: None  . Transportation needs - medical: None  . Transportation needs - non-medical: None  Occupational History  . Occupation: retired bus Geophysical data processor  . Smoking status: Never Smoker  . Smokeless tobacco: Never Used  Substance and Sexual Activity  . Alcohol use: Yes    Comment: 6 pack / week  . Drug use: No  . Sexual activity: None  Other Topics Concern  . None  Social History Narrative  . None     Family History: The patient's family history includes Cancer in his father; Heart disease in his father; Stroke in his father.  ROS:   Please see the history of present illness.    All other systems reviewed and are negative.  EKGs/Labs/Other Studies Reviewed:    1. The following studies were reviewed  today:EKG done today reveals sinus rhythm with first-degree AV block and nonspecific ST-T changes.  I reviewed extensively from primary care physician.   Recent Labs: No results found for requested labs within last 8760 hours.  Recent Lipid Panel No results found for: CHOL, TRIG, HDL, CHOLHDL, VLDL, LDLCALC, LDLDIRECT  Physical Exam:    VS:  BP 126/70 (BP Location: Left Arm, Patient Position: Sitting, Cuff Size: Large)   Pulse (!) 52   Ht 5\' 9"  (1.753 m)   Wt 264 lb 0.6 oz (119.8 kg)   SpO2 95%   BMI 38.99 kg/m     Wt Readings from Last 3 Encounters:  03/02/17 264 lb 0.6 oz (119.8 kg)  03/25/16 253 lb (114.8 kg)  03/06/16 253 lb 9.6 oz (115 kg)     GEN: Patient is in no acute  distress HEENT: Normal NECK: No JVD; No carotid bruits LYMPHATICS: No lymphadenopathy CARDIAC: S1 S2 regular, 2/6 systolic murmur at the apex. RESPIRATORY:  Clear to auscultation without rales, wheezing or rhonchi  ABDOMEN: Soft, non-tender, non-distended MUSCULOSKELETAL:  No edema; No deformity  SKIN: Warm and dry NEUROLOGIC:  Alert and oriented x 3 PSYCHIATRIC:  Normal affect    Signed, Jenean Lindau, MD  03/02/2017 9:40 AM    [place]

## 2017-03-02 NOTE — Patient Instructions (Signed)
Medication Instructions:  Your physician recommends that you continue on your current medications as directed. Please refer to the Current Medication list given to you today.  Labwork: None  Testing/Procedures: Your physician has requested that you have a stress echocardiogram. For further information please visit [email or web address]. Please follow instruction sheet as given.  Follow-Up: Your physician recommends that you schedule a follow-up appointment in: 1 month  Any Other Special Instructions Will Be Listed Below (If Applicable).  Be sure to take all your blood pressure medications the morning of your stress echo   If you need a refill on your cardiac medications before your next appointment, please call your pharmacy.   [place], RN, BSN    Exercise Stress Echocardiogram An exercise stress echocardiogram is a test to check how well your heart is working. This test uses sound waves (ultrasound) and a computer to make images of your heart before and after exercise. Ultrasound images that are taken before you exercise (your resting echocardiogram) will show how much blood is getting to your heart muscle and how well your heart muscle and heart valves are functioning. During the next part of this test, you will walk on a treadmill or ride a stationary bike to see how exercise affects your heart. While you exercise, the electrical activity of your heart will be monitored with an electrocardiogram (ECG). Your blood pressure will also be monitored. You may have this test if you:  Have chest pain or other symptoms of a heart problem.  Recently had a heart attack or heart surgery.  Have heart valve problems.  Have a condition that causes narrowing of the blood vessels that supply your heart (coronary artery disease).  Have a high risk of heart disease and are starting a new exercise program.  Have a high risk of heart disease and need to have major  surgery.  Tell a health care provider about:  Any allergies you have.  All medicines you are taking, including vitamins, herbs, eye drops, creams, and over-the-counter medicines.  Any problems you or family members have had with anesthetic medicines.  Any blood disorders you have.  Any surgeries you have had.  Any medical conditions you have.  Whether you are pregnant or may be pregnant. What are the risks? Generally, this is a safe procedure. However, problems may occur, including:  Chest pain.  Dizziness or light-headedness.  Shortness of breath.  Increased or irregular heartbeat (palpitations).  Nausea or vomiting.  Heart attack (very rare).  What happens before the procedure?  Follow instructions from your health care provider about eating or drinking restrictions. You may be asked to avoid all forms of caffeine for 24 hours before your procedure, or as told by your health care provider.  Ask your health care provider about changing or stopping your regular medicines. This is especially important if you are taking diabetes medicines or blood thinners.  If you use an inhaler, bring it with you to the test.  Wear loose, comfortable clothing and walking shoes.  Do notuse any products that contain nicotine or tobacco, such as cigarettes and e-cigarettes, for 4 hours before the test or as told by your health care provider. If you need help quitting, ask your health care provider. What happens during the procedure?  You will take off your clothes from the waist up and put on a hospital gown.  A technician will place electrodes on your chest.  A blood pressure cuff will be placed on  your arm.  You will lie down on a table for an ultrasound exam before you exercise. Gel will be rubbed on your chest, and a handheld device (transducer) will be pressed against your chest and moved over your heart.  Then, you will start exercising by walking on a treadmill or pedaling a  stationary bicycle.  Your blood pressure and heart rhythm will be monitored while you exercise.  The exercise will gradually get harder or faster.  You will exercise until: ? Your heart reaches a target level. ? You are too tired to continue. ? You cannot continue because of chest pain, weakness, or dizziness.  You will have another ultrasound exam after you stop exercising. The procedure may vary among health care providers and hospitals. What happens after the procedure?  Your heart rate and blood pressure will be monitored until they return to your normal levels. Summary  An exercise stress echocardiogram is a test that uses ultrasound to check how well your heart works before and after exercise.  Before the test, follow instructions from your health care provider about stopping medications, avoiding nicotine and tobacco, and avoiding certain foods and drinks.  During the test, your blood pressure and heart rhythm will be monitored while you exercise on a treadmill or stationary bicycle. This information is not intended to replace advice given to you by your health care provider. Make sure you discuss any questions you have with your health care provider. Document Released: 02/15/2004 Document Revised: 10/03/2015 Document Reviewed: 10/03/2015 Elsevier Interactive Patient Education  2018 [employer].

## 2017-03-18 ENCOUNTER — Ambulatory Visit (HOSPITAL_BASED_OUTPATIENT_CLINIC_OR_DEPARTMENT_OTHER)
Admission: RE | Admit: 2017-03-18 | Discharge: 2017-03-18 | Disposition: A | Discharge status: Home / Self Care | Payer: PPO | Source: Ambulatory Visit | Attending: Cardiology | Admitting: Cardiology

## 2017-03-18 DIAGNOSIS — Z01818 Encounter for other preprocedural examination: Secondary | ICD-10-CM | POA: Insufficient documentation

## 2017-03-18 DIAGNOSIS — Z01810 Encounter for preprocedural cardiovascular examination: Secondary | ICD-10-CM | POA: Diagnosis not present

## 2017-03-18 DIAGNOSIS — Z87898 Personal history of other specified conditions: Secondary | ICD-10-CM | POA: Diagnosis not present

## 2017-03-18 DIAGNOSIS — Z79899 Other long term (current) drug therapy: Secondary | ICD-10-CM | POA: Diagnosis not present

## 2017-03-18 DIAGNOSIS — E785 Hyperlipidemia, unspecified: Secondary | ICD-10-CM | POA: Insufficient documentation

## 2017-03-18 DIAGNOSIS — I10 Essential (primary) hypertension: Secondary | ICD-10-CM

## 2017-03-18 NOTE — Progress Notes (Signed)
Echocardiogram Echocardiogram Stress Test has been performed.  Bryan Floyd 03/18/2017, 11:54 AM

## 2017-04-02 ENCOUNTER — Ambulatory Visit (INDEPENDENT_AMBULATORY_CARE_PROVIDER_SITE_OTHER): Payer: PPO | Admitting: Cardiology

## 2017-04-02 ENCOUNTER — Encounter: Payer: Self-pay | Admitting: Cardiology

## 2017-04-02 VITALS — BP 140/80 | HR 65 | Ht 69.0 in | Wt 263.8 lb

## 2017-04-02 DIAGNOSIS — E782 Mixed hyperlipidemia: Secondary | ICD-10-CM | POA: Diagnosis not present

## 2017-04-02 DIAGNOSIS — E663 Overweight: Secondary | ICD-10-CM | POA: Diagnosis not present

## 2017-04-02 DIAGNOSIS — Z01810 Encounter for preprocedural cardiovascular examination: Secondary | ICD-10-CM

## 2017-04-02 DIAGNOSIS — I10 Essential (primary) hypertension: Secondary | ICD-10-CM | POA: Diagnosis not present

## 2017-04-02 NOTE — Patient Instructions (Signed)
Medication Instructions:  Your physician recommends that you continue on your current medications as directed. Please refer to the Current Medication list given to you today.  Labwork: None  Testing/Procedures: None  Follow-Up: Your physician recommends that you schedule a follow-up appointment in: If you ever need to return please call our office.  Any Other Special Instructions Will Be Listed Below (If Applicable).     If you need a refill on your cardiac medications before your next appointment, please call your pharmacy.   [place], RN, BSN

## 2017-04-02 NOTE — Progress Notes (Signed)
Cardiology Office Note:    Date:  04/02/2017   ID:  Bryan Floyd, DOB Jan 09, 1951, MRN [id number]  PCP:  Harlan Stains, MD  Cardiologist:  Jenean Lindau, MD   Referring MD: Harlan Stains, MD    ASSESSMENT:    1. Preop cardiovascular exam   2. Essential hypertension   3. Mixed dyslipidemia   4. Overweight    PLAN:    In order of problems listed above:  1. Primary prevention stressed with the patient.  Importance of compliance with diet and medications stressed.  In view of the above findings is not at high risk for coronary events during the aforementioned surgery.  Meticulous hemodynamic monitoring will further reduce the risk of coronary events. 2. I mentioned to the patient that his stress echo report.  I told him that he should be exercising on a regular basis almost 30-45 minutes a day and that this will help him get well condition for his surgery and he vocalized understanding.  He is now discharged 3. From our practice will be seen in follow-up on an outpatient basis only.  Please do not hesitate to call us if there are any questions about his cardiovascular management.   Medication Adjustments/Labs and Tests Ordered: Current medicines are reviewed at length with the patient today.  Concerns regarding medicines are outlined above.  No orders of the defined types were placed in this encounter.  No orders of the defined types were placed in this encounter.    Chief Complaint  Patient presents with  . Follow-up  . Hypertension  . Hyperlipidemia     History of Present Illness:    Bryan Floyd is a 67 y.o. male.  The patient has multiple risk factors for coronary artery disease and was evaluated by me for gastrointestinal surgery.  He denies any problems at this time and takes care of activities of daily living.  He is undergone stress echo which was unremarkable and is here for follow-up.  No chest pain orthopnea or PND.  He mentions to me that he now walks  45 minutes 2-3 times in a week.  This is asymptomatic.  At the time of my evaluation, the patient is alert awake oriented and in no distress.  Past Medical History:  Diagnosis Date  . Glaucoma   . Hypertension     Past Surgical History:  Procedure Laterality Date  . COLONOSCOPY WITH PROPOFOL N/A 03/25/2016   Procedure: COLONOSCOPY WITH PROPOFOL;  Surgeon: Clarene Essex, MD;  Location: [facility];  Service: Endoscopy;  Laterality: N/A;  . CYSTOSCOPY N/A 07/24/2013   Procedure: CYSTOSCOPY;  Surgeon: Festus Aloe, MD;  Location: [facility];  Service: Urology;  Laterality: N/A;  . EYE SURGERY     laser  . HOT HEMOSTASIS N/A 03/25/2016   Procedure: HOT HEMOSTASIS (ARGON PLASMA COAGULATION/BICAP);  Surgeon: Clarene Essex, MD;  Location: [facility];  Service: Endoscopy;  Laterality: N/A;  . TRANSURETHRAL RESECTION OF PROSTATE N/A 07/24/2013   Procedure: TRANSURETHRAL RESECTION OF THE PROSTATE (TURP), staged;  Surgeon: Festus Aloe, MD;  Location: [facility];  Service: Urology;  Laterality: N/A;    Current Medications: Current Meds  Medication Sig  . amLODipine (NORVASC) 10 MG tablet Take 10 mg by mouth daily.  . brimonidine-timolol (COMBIGAN) 0.2-0.5 % ophthalmic solution Place 1 drop into both eyes every 12 (twelve) hours.  . cetirizine (ZYRTEC) 10 MG tablet Take 10 mg by mouth daily.  . cyclobenzaprine (FLEXERIL) 10 MG tablet Take 1 tablet (10 mg total) by  mouth 3 (three) times daily as needed for muscle spasms. Reported on 04/29/2015  . finasteride (PROSCAR) 5 MG tablet Take 1 tablet (5 mg total) by mouth daily.  . hydrALAZINE (APRESOLINE) 10 MG tablet   . hydrochlorothiazide (HYDRODIURIL) 25 MG tablet   . ipratropium (ATROVENT) 0.06 % nasal spray   . Multiple Vitamin (MULTIVITAMIN WITH MINERALS) TABS tablet Take 1 tablet by mouth daily.  . naproxen (NAPROSYN) 500 MG tablet Take 500 mg by mouth every 12 (twelve) hours as needed for moderate pain.   Marland Kitchen olmesartan (BENICAR) 40 MG tablet   .  olmesartan-hydrochlorothiazide (BENICAR HCT) 40-25 MG tablet Take 1 tablet by mouth daily.  . rosuvastatin (CRESTOR) 5 MG tablet Take 5 mg by mouth daily at 6 PM.   . tamsulosin (FLOMAX) 0.4 MG CAPS capsule Take 1 capsule (0.4 mg total) by mouth daily after supper.  Marland Kitchen VYZULTA 0.024 % SOLN      Allergies:   Patient has no known allergies.   Social History   Socioeconomic History  . Marital status: Married    Spouse name: None  . Number of children: None  . Years of education: None  . Highest education level: None  Social Needs  . Financial resource strain: None  . Food insecurity - worry: None  . Food insecurity - inability: None  . Transportation needs - medical: None  . Transportation needs - non-medical: None  Occupational History  . Occupation: retired bus Geophysical data processor  . Smoking status: Never Smoker  . Smokeless tobacco: Never Used  Substance and Sexual Activity  . Alcohol use: Yes    Comment: 6 pack / week  . Drug use: No  . Sexual activity: None  Other Topics Concern  . None  Social History Narrative  . None     Family History: The patient's family history includes Cancer in his father; Heart disease in his father; Stroke in his father.  ROS:   Please see the history of present illness.    All other systems reviewed and are negative.  EKGs/Labs/Other Studies Reviewed:    The following studies were reviewed today: I reviewed stress echo findings with the patient at extensive length.   Recent Labs: No results found for requested labs within last 8760 hours.  Recent Lipid Panel No results found for: CHOL, TRIG, HDL, CHOLHDL, VLDL, LDLCALC, LDLDIRECT  Physical Exam:    VS:  BP 140/80 (BP Location: Right Arm, Patient Position: Sitting, Cuff Size: Normal)   Pulse 65   Ht 5\' 9"  (1.753 m)   Wt 263 lb 12.8 oz (119.7 kg)   SpO2 98%   BMI 38.96 kg/m     Wt Readings from Last 3 Encounters:  04/02/17 263 lb 12.8 oz (119.7 kg)  03/02/17 264 lb 0.6 oz  (119.8 kg)  03/25/16 253 lb (114.8 kg)     GEN: Patient is in no acute distress HEENT: Normal NECK: No JVD; No carotid bruits LYMPHATICS: No lymphadenopathy CARDIAC: Hear sounds regular, 2/6 systolic murmur at the apex. RESPIRATORY:  Clear to auscultation without rales, wheezing or rhonchi  ABDOMEN: Soft, non-tender, non-distended MUSCULOSKELETAL:  No edema; No deformity  SKIN: Warm and dry NEUROLOGIC:  Alert and oriented x 3 PSYCHIATRIC:  Normal affect   Signed, Jenean Lindau, MD  04/02/2017 9:52 AM    [facility]

## 2017-04-02 NOTE — Addendum Note (Signed)
Addended by: Tarri Glenn on: 04/02/2017 11:12 AM   Modules accepted: Orders

## 2017-04-03 ENCOUNTER — Ambulatory Visit: Payer: PPO | Admitting: Internal Medicine

## 2017-04-07 ENCOUNTER — Encounter (HOSPITAL_COMMUNITY): Payer: Self-pay | Admitting: *Deleted

## 2017-04-07 ENCOUNTER — Other Ambulatory Visit: Payer: Self-pay

## 2017-04-10 ENCOUNTER — Ambulatory Visit (HOSPITAL_COMMUNITY): Payer: PPO | Admitting: Anesthesiology

## 2017-04-10 ENCOUNTER — Encounter (HOSPITAL_COMMUNITY)
Admission: RE | Disposition: A | Discharge status: Home / Self Care | Payer: Self-pay | Source: Ambulatory Visit | Attending: Surgery

## 2017-04-10 ENCOUNTER — Other Ambulatory Visit: Payer: Self-pay

## 2017-04-10 ENCOUNTER — Encounter (HOSPITAL_COMMUNITY): Payer: Self-pay | Admitting: *Deleted

## 2017-04-10 ENCOUNTER — Ambulatory Visit (HOSPITAL_COMMUNITY)
Admission: RE | Admit: 2017-04-10 | Discharge: 2017-04-10 | Disposition: A | Discharge status: Home / Self Care | Payer: PPO | Source: Ambulatory Visit | Attending: Surgery | Admitting: Surgery

## 2017-04-10 DIAGNOSIS — K621 Rectal polyp: Secondary | ICD-10-CM | POA: Diagnosis not present

## 2017-04-10 DIAGNOSIS — I10 Essential (primary) hypertension: Secondary | ICD-10-CM | POA: Diagnosis not present

## 2017-04-10 DIAGNOSIS — D127 Benign neoplasm of rectosigmoid junction: Secondary | ICD-10-CM | POA: Diagnosis not present

## 2017-04-10 DIAGNOSIS — K635 Polyp of colon: Secondary | ICD-10-CM | POA: Diagnosis not present

## 2017-04-10 DIAGNOSIS — E785 Hyperlipidemia, unspecified: Secondary | ICD-10-CM | POA: Diagnosis not present

## 2017-04-10 DIAGNOSIS — D120 Benign neoplasm of cecum: Secondary | ICD-10-CM | POA: Insufficient documentation

## 2017-04-10 DIAGNOSIS — N400 Enlarged prostate without lower urinary tract symptoms: Secondary | ICD-10-CM | POA: Insufficient documentation

## 2017-04-10 DIAGNOSIS — K5730 Diverticulosis of large intestine without perforation or abscess without bleeding: Secondary | ICD-10-CM | POA: Diagnosis not present

## 2017-04-10 DIAGNOSIS — Z7982 Long term (current) use of aspirin: Secondary | ICD-10-CM | POA: Diagnosis not present

## 2017-04-10 DIAGNOSIS — E7800 Pure hypercholesterolemia, unspecified: Secondary | ICD-10-CM | POA: Insufficient documentation

## 2017-04-10 DIAGNOSIS — Z86010 Personal history of colonic polyps: Secondary | ICD-10-CM | POA: Diagnosis not present

## 2017-04-10 DIAGNOSIS — Z79899 Other long term (current) drug therapy: Secondary | ICD-10-CM | POA: Diagnosis not present

## 2017-04-10 HISTORY — PX: COLONOSCOPY WITH PROPOFOL: SHX5780

## 2017-04-10 SURGERY — COLONOSCOPY WITH PROPOFOL
Anesthesia: Monitor Anesthesia Care

## 2017-04-10 MED ORDER — LACTATED RINGERS IV SOLN
INTRAVENOUS | Status: DC | PRN
  Administered 2017-04-10: 09:00:00 via INTRAVENOUS

## 2017-04-10 MED ORDER — PROPOFOL 500 MG/50ML IV EMUL
INTRAVENOUS | Status: DC | PRN
  Administered 2017-04-10: 100 ug/kg/min via INTRAVENOUS

## 2017-04-10 MED ORDER — PROPOFOL 10 MG/ML IV BOLUS
INTRAVENOUS | Status: DC | PRN
  Administered 2017-04-10: 20 mg via INTRAVENOUS

## 2017-04-10 MED ORDER — PROPOFOL 10 MG/ML IV BOLUS
INTRAVENOUS | Status: AC
  Filled 2017-04-10: qty 20

## 2017-04-10 MED ORDER — PROPOFOL 10 MG/ML IV BOLUS
INTRAVENOUS | Status: AC
Start: 2017-04-10 — End: 2017-04-10
  Filled 2017-04-10: qty 40

## 2017-04-10 MED ORDER — SPOT INK MARKER SYRINGE KIT
PACK | SUBMUCOSAL | Status: AC
  Filled 2017-04-10: qty 5

## 2017-04-10 MED ORDER — SPOT INK MARKER SYRINGE KIT
PACK | SUBMUCOSAL | Status: DC | PRN
  Administered 2017-04-10: 8 mL via SUBMUCOSAL

## 2017-04-10 NOTE — H&P (Signed)
History of Present Illness CC: Multiple colon polpys, endoscopically unretrievable  HPI: Bryan Floyd is a very pleasant 67 year old gentleman referred for evaluation of endoscopically unretrievable polyps.  He underwent his first screening colonoscopy approximately 1.5 years ago and was found to have multiple polyps in his colon with Dr. Watt Climes.  Repeat colonoscopy 03/25/2016 demonstrated persistence of colon polyps. CScope 03/25/16: 1. 4 semi-sessile polyps are found the rectum, descending colon and mid transverse colon.  The polyps are small in size.  The polyps removed with a hot snare and resection/retrieval was complete.  Pathology returned tubular adenomas; well-differentiated neuroendocrine tumor.  All polyps were less than a centimeter in size. 2.  Medium polyp found in the cecum, semi-sessile, removed piecemeal using hot snare and resection was noted to be complete.  Pathology return tubular adenoma 3.  Splenic flexure with tattoo had a large semi-sessile polyp located this area removed with piecemeal technique using hot snare.  Resection was incomplete.  Pathology returned tubular adenoma 4.  Large polyp found in the mid ascending colon near the tattoo, semi-sessile, incompletely removed with piecemeal using hot snare.  Pathology returned tubular adenoma  He subsequently was referred to [facility] for evaluation and attempted EMR/ESD - this occurred approximately 1 month ago findings: 1.  Ascending colon-multiple fragments of adenomatous polyp with focal high-grade dysplasia and focal suspicion of early stromal invasion/intramucosal adenocarcinoma 2.  Colon "base" of polypectomy and biopsy-adenomatous polyp 3.  Cecum biopsy-adenomatous polyp no high-grade dysplasia 4.  Sigmoid biopsy-adenomatous polyp with high-grade dysplasia  Today he reports doing well.  He denies any melena or blood per rectum.  He reports having one formed bowel movement per day.  He denies history of incontinence to solid  stool, liquid stool nor gas. His weight is stable. He reports good activity status, can easily climb 2 flights of stairs without difficulty.  PMH: HTN (controlled on oral antihypertensives) PSHx: Denies FHx: Denies FHx of malignancy Social: Denies use of tobacco/EtOH/drugs ROS: A comprehensive 10 system ROS was completed with the patient. Pertinent findings as noted above.    Past Surgical History TURP    Diagnostic Studies History Colonoscopy   within last year  Allergies No Known Drug Allergies  01/20/2017  Medication History  Metoprolol Succinate ER  (25MG  Tablet ER 24HR, Oral) Active. Ipratropium Bromide  (0.06% Solution, Nasal) Active. Centrum Adults  (Oral) Active. Cetirizine HCl  (10MG  Tablet, Oral) Active. Combigan  (0.2-0.5% Solution, Ophthalmic) Active. Cyclobenzaprine HCl  (10MG  Tablet, Oral) Active. Finasteride  (5MG  Tablet, Oral) Active. Tamsulosin HCl  (0.4MG  Capsule, Oral) Active. Travatan Z  (0.004% Solution, Ophthalmic) Active. Aspirin  (81MG  Tablet, Oral) Active. AmLODIPine Besylate  (10MG  Tablet, Oral) Active. Olmesartan Medoxomil-HCTZ  (40-25MG  Tablet, Oral) Active. Medications Reconciled   Social History Alcohol use   Occasional alcohol use. Caffeine use   Coffee. No drug use   Tobacco use   Never smoker.  Family History  First Degree Relatives   No pertinent family history    Other Problems  Back Pain   Enlarged Prostate   High blood pressure   Hypercholesterolemia      Review of Systems  General Not Present- Appetite Loss, Chills, Fatigue, Fever, Night Sweats, Weight Gain and Weight Loss. Skin Not Present- Change in Wart/Mole, Dryness, Hives, Jaundice, New Lesions, Non-Healing Wounds, Rash and Ulcer. HEENT Present- Seasonal Allergies and Wears glasses/contact lenses. Not Present- Earache, Hearing Loss, Hoarseness, Nose Bleed, Oral Ulcers, Ringing in the Ears, Sinus Pain, Sore Throat, Visual Disturbances and Yellow Eyes. Breast Not  Present- Breast Mass, Breast Pain, Nipple Discharge and Skin Changes. Cardiovascular Not Present- Chest Pain, Difficulty Breathing Lying Down, Leg Cramps, Palpitations, Rapid Heart Rate, Shortness of Breath and Swelling of Extremities. Gastrointestinal Not Present- Abdominal Pain, Bloating, Bloody Stool, Change in Bowel Habits, Chronic diarrhea, Constipation, Difficulty Swallowing, Excessive gas, Gets full quickly at meals, Hemorrhoids, Indigestion, Nausea, Rectal Pain and Vomiting. Male Genitourinary Not Present- Blood in Urine, Change in Urinary Stream, Frequency, Impotence, Nocturia, Painful Urination, Urgency and Urine Leakage. Musculoskeletal Not Present- Back Pain, Joint Pain, Joint Stiffness, Muscle Pain, Muscle Weakness and Swelling of Extremities. Neurological Not Present- Decreased Memory, Fainting, Headaches, Numbness, Seizures, Tingling, Tremor, Trouble walking and Weakness. Psychiatric Not Present- Anxiety, Bipolar, Change in Sleep Pattern, Depression, Fearful and Frequent crying. Hematology Not Present- Blood Thinners, Easy Bruising, Excessive bleeding, Gland problems, HIV and Persistent Infections.  Vitals:   04/10/17 0901  BP: 132/74  Pulse: 65  Resp: (!) 27  Temp: 97.9 F (36.6 C)  TempSrc: Oral  SpO2: 98%  Weight: 119.3 kg (263 lb)  Height: 5\' 9"  (1.753 m)       Physical Exam  The physical exam findings are as follows: Note: Constitutional: No acute distress; conversant; no deformities Eyes: Moist conjunctiva; no lid lag; anicteric Neck: Trachea midline; no thyromegaly Lungs: Normal respiratory effort; no tactile fremitus CV: RRR; no palpable thrill; no pitting edema GI: Abdomen soft, NT; no palpable hepatosplenomegaly MSK: Normal gait; no clubbing/cyanosis Psych: Appropriate affect; alert and oriented x3 Lymphatic: No palpable cervical or axillary lymphadenopathy    Assessment & Plan  Impression: [name] is a very pleasant 67yo gentleman with multiple  endoscopically unretrievable colon polyps. -It is unclear if all of these had been tattooed and there is conflicting endoscopy reports as well as the location of these polyps. As such I have requested we repeat his colonoscopy which I'll perform an plan to tattoo any endoscopically unretrievable lesions. -The anatomy and physiology of the GI tract was elaborated and pathophysiology of polyps and cancer were discussed as well. -The planned procedure, material risks (including, but not limited to, bleeding, perforation, need for additional procedures, aspiration, pneumonia, heart attack, stroke and death) benefits and alternatives were discussed. His questions were answered to his satisfaction and he elected to proceed.  Sharon Mt. Dema Severin, M.D. General and Colorectal Surgery [facility]

## 2017-04-10 NOTE — Op Note (Addendum)
[facility] Patient Name: Bryan Floyd Procedure Date: 04/10/2017 MRN: [id number] Attending MD: Ileana Roup MD, MD Date of Birth: 1950-03-16 CSN: [id number] Age: 67 Admit Type: Outpatient Procedure:                Colonoscopy Indications:              Therapeutic procedure, Therapeutic procedure for                            known colon polyps Providers:                Sharon Mt. Shaily Librizzi MD, MD, Zenon Mayo, RN,                            Elspeth Cho Tech., Technician, Charolette Child,                            Technician, Roberts Alday CRNA, CRNA Referring MD:              Medicines:                Monitored Anesthesia Care Complications:            No immediate complications. Estimated Blood Loss:     Estimated blood loss: none. Procedure:                Pre-Anesthesia Assessment:                           - ASA Grade Assessment: III - A patient with severe                            systemic disease.                           - The anesthesia plan was to use monitored                            anesthesia care (MAC).                           After obtaining informed consent, the colonoscope                            was passed under direct vision. Throughout the                            procedure, the patient's blood pressure, pulse, and                            oxygen saturations were monitored continuously. The                            EC-[id number] ([id number]) scope was introduced through                            the anus and advanced to the the cecum, identified  by appendiceal orifice and ileocecal valve. The                            colonoscopy was performed without difficulty. The                            patient tolerated the procedure well. The quality                            of the bowel preparation was adequate. Scope In: 10:48:14 AM Scope Out: 11:35:31 AM Scope Withdrawal Time: 0 hours 40  minutes 28 seconds  Total Procedure Duration: 0 hours 47 minutes 17 seconds  Findings:      The perianal and digital rectal examinations were normal.      Multiple medium-mouthed diverticula were found in the sigmoid colon.      A 3 mm polyp was found in the cecum. The polyp was semi-sessile. The       polyp was removed with a jumbo cold forceps. Resection and retrieval       were complete.      A 3 mm polyp was found in the recto-sigmoid colon. The polyp was       semi-sessile. The polyp was removed with a jumbo cold forceps. Resection       and retrieval were complete.      A tattoo was seen in the proximal ascending colon. The tattoo site       appeared abnormal - carpeting polypoid mass here. Area was tattooed with       an injection of 3 mL of Spot (carbon black) at 3 separate locations       around lesion.      A tattoo was seen at the splenic flexure. The tattoo site appeared       abnormal - carpeting polypoid mass here. Area was tattooed with an       injection of 3 mL of Spot (carbon black) at 3 separate locations around       lesion.      The exam was otherwise without abnormality on direct and retroflexion       views. Impression:               - Diverticulosis in the sigmoid colon.                           - One 3 mm polyp in the cecum, removed with a jumbo                            cold forceps. Resected and retrieved.                           - One 3 mm polyp at the recto-sigmoid colon,                            removed with a jumbo cold forceps. Resected and                            retrieved.                           -  A tattoo was seen in the proximal ascending                            colon. The tattoo site appeared abnormal. Tattooed.                           - A tattoo was seen at the splenic flexure. The                            tattoo site appeared abnormal. Tattooed.                           - The examination was otherwise normal on direct                             and retroflexion views. Moderate Sedation:      N/A- Per Anesthesia Care Recommendation:           - High fiber diet indefinitely.                           - Await pathology results.                           - Repeat colonoscopy in 1 year for surveillance.                           - Return to my office in 1 week to discuss possible                            surgery for these lesions. Procedure Code(s):        --- Professional ---                           [phone], Colonoscopy, flexible; with biopsy, single                            or multiple                           [id number], Colonoscopy, flexible; with directed                            submucosal injection(s), any substance Diagnosis Code(s):        --- Professional ---                           D12.0, Benign neoplasm of cecum                           D12.7, Benign neoplasm of rectosigmoid junction                           K63.5, Polyp of colon  K57.30, Diverticulosis of large intestine without                            perforation or abscess without bleeding CPT copyright 2016 [employer]. All rights reserved. The codes documented in this report are preliminary and upon coder review may  be revised to meet current compliance requirements. Nadeen Landau, MD Ileana Roup MD, MD 04/10/2017 11:49:38 AM This report has been signed electronically. Number of Addenda: 0

## 2017-04-10 NOTE — Transfer of Care (Signed)
Immediate Anesthesia Transfer of Care Note  Patient: Bryan Floyd  Procedure(s) Performed: COLONOSCOPY WITH PROPOFOL (N/A )  Patient Location: PACU  Anesthesia Type:MAC  Level of Consciousness: sedated  Airway & Oxygen Therapy: Patient Spontanous Breathing and Patient connected to face mask oxygen  Post-op Assessment: Report given to RN and Post -op Vital signs reviewed and stable  Post vital signs: Reviewed and stable  Last Vitals:  Vitals:   04/10/17 0901  BP: 132/74  Pulse: 65  Resp: (!) 27  Temp: 36.6 C  SpO2: 98%    Last Pain:  Vitals:   04/10/17 0901  TempSrc: Oral         Complications: No apparent anesthesia complications

## 2017-04-10 NOTE — Discharge Instructions (Signed)

## 2017-04-10 NOTE — Anesthesia Preprocedure Evaluation (Signed)
Anesthesia Evaluation  Patient identified by MRN, date of birth, ID band Patient awake    Reviewed: Allergy & Precautions, H&P , NPO status , Patient's Chart, lab work & pertinent test results  Airway Mallampati: II   Neck ROM: full    Dental   Pulmonary neg pulmonary ROS,    breath sounds clear to auscultation       Cardiovascular hypertension,  Rhythm:regular Rate:Normal     Neuro/Psych    GI/Hepatic   Endo/Other    Renal/GU      Musculoskeletal   Abdominal   Peds  Hematology   Anesthesia Other Findings   Reproductive/Obstetrics                            Anesthesia Physical Anesthesia Plan  ASA: II  Anesthesia Plan: MAC   Post-op Pain Management:    Induction: Intravenous  PONV Risk Score and Plan: 1 and Treatment may vary due to age or medical condition  Airway Management Planned: Nasal Cannula  Additional Equipment:   Intra-op Plan:   Post-operative Plan:   Informed Consent: I have reviewed the patients History and Physical, chart, labs and discussed the procedure including the risks, benefits and alternatives for the proposed anesthesia with the patient or authorized representative who has indicated his/her understanding and acceptance.     Plan Discussed with: CRNA, Anesthesiologist and Surgeon  Anesthesia Plan Comments:         Anesthesia Quick Evaluation

## 2017-04-13 ENCOUNTER — Encounter (HOSPITAL_COMMUNITY): Payer: Self-pay | Admitting: Surgery

## 2017-04-13 NOTE — Anesthesia Postprocedure Evaluation (Signed)
Anesthesia Post Note  Patient: Bryan Floyd  Procedure(s) Performed: COLONOSCOPY WITH PROPOFOL (N/A )     Patient location during evaluation: PACU Anesthesia Type: MAC Level of consciousness: awake and alert Pain management: pain level controlled Vital Signs Assessment: post-procedure vital signs reviewed and stable Respiratory status: spontaneous breathing, nonlabored ventilation, respiratory function stable and patient connected to nasal cannula oxygen Cardiovascular status: stable and blood pressure returned to baseline Postop Assessment: no apparent nausea or vomiting Anesthetic complications: no    Last Vitals:  Vitals:   04/10/17 1210 04/10/17 1220  BP: (!) 111/58   Pulse: (!) 58 (!) 55  Resp: 20 (!) 23  Temp:    SpO2: 100% 99%    Last Pain:  Vitals:   04/10/17 1142  TempSrc: Oral                 Yojan Paskett S

## 2017-04-20 ENCOUNTER — Ambulatory Visit: Payer: Self-pay | Admitting: Surgery

## 2017-04-20 DIAGNOSIS — C189 Malignant neoplasm of colon, unspecified: Secondary | ICD-10-CM | POA: Diagnosis not present

## 2017-04-20 NOTE — H&P (Signed)
CC: Multiple colon polpys, endoscopically unretrievable  HPI: Bryan Floyd is a very pleasant 67 year old gentleman referred for evaluation of endoscopically unretrievable polyps.  He underwent his first screening colonoscopy approximately 1.5 years ago and was found to have multiple polyps in his colon with Dr. Watt Climes.  Repeat colonoscopy 03/25/2016 demonstrated persistence of colon polyps. CScope 03/25/16:  1. 4 semi-sessile polyps are found the rectum, descending colon and mid transverse colon.  The polyps are small in size.  The polyps removed with a hot snare and resection/retrieval was complete.  Pathology returned tubular adenomas; well-differentiated neuroendocrine tumor.  All polyps were less than a centimeter in size. 2.  Medium polyp found in the cecum, semi-sessile, removed piecemeal using hot snare and resection was noted to be complete.  Pathology return tubular adenoma 3.  Splenic flexure with tattoo had a large semi-sessile polyp located this area removed with piecemeal technique using hot snare.  Resection was incomplete.  Pathology returned tubular adenoma 4.  Large polyp found in the mid ascending colon near the tattoo, semi-sessile, incompletely removed with piecemeal using hot snare.  Pathology returned tubular adenoma  He subsequently was referred to [facility] for evaluation and attempted EMR/ESD - this occurred approximately 1 month ago findings: 1.  Ascending colon-multiple fragments of adenomatous polyp with focal high-grade dysplasia and focal suspicion of early stromal invasion/intramucosal adenocarcinoma 2.  Colon "base" of polypectomy and biopsy-adenomatous polyp 3.  Cecum biopsy-adenomatous polyp no high-grade dysplasia 4.  Sigmoid biopsy-adenomatous polyp with high-grade dysplasia  CT C/A/P 01/2017 - no evidence of metastatic disease.  I repeated his colonoscopy 04/10/17. The two endoscopically unresectable polyps were identified and estimated to be in the proximal ascending  colon and at the splenic flexure. Additional tattoo was injected. Additional polyps were identified and removed - one 45mm in the cecum and anolther in the rectosigmoid. Path returned cecal tubular adenoma and a HP in the retosigmoid.  Today he reports doing well.  He denies any melena or blood per rectum.  He reports having one formed bowel movement per day.  He denies history of incontinence to solid stool, liquid stool nor gas. His weight is stable. He reports good activity status, can easily climb 2 flights of stairs without difficulty.  He has received cardiac clearance for surgery as well.  PMH: HTN (controlled on oral antihypertensives); BPH PSHx: Denies FHx: Denies FHx of malignancy Social: Denies use of tobacco/EtOH/drugs ROS: A comprehensive 10 system ROS was completed with the patient. Pertinent findings as noted above.  The patient is a 67 year old male.   Allergies Alean Rinne, [place]; 04/20/2017 9:14 AM) No Known Drug Allergies  [01/20/2017]: Allergies Reconciled    Medication History Alean Rinne, [place]; 04/20/2017 9:14 AM) Metoprolol Succinate ER  (25MG  Tablet ER 24HR, Oral) Active. Ipratropium Bromide  (0.06% Solution, Nasal) Active. Centrum Adults  (Oral) Active. Cetirizine HCl  (10MG  Tablet, Oral) Active. Combigan  (0.2-0.5% Solution, Ophthalmic) Active. Cyclobenzaprine HCl  (10MG  Tablet, Oral) Active. Finasteride  (5MG  Tablet, Oral) Active. Tamsulosin HCl  (0.4MG  Capsule, Oral) Active. Travatan Z  (0.004% Solution, Ophthalmic) Active. Aspirin  (81MG  Tablet, Oral) Active. AmLODIPine Besylate  (10MG  Tablet, Oral) Active. Olmesartan Medoxomil-HCTZ  (40-25MG  Tablet, Oral) Active. Medications Reconciled     Review of Systems Bryan Gave M. Jyra Lagares MD; 04/20/2017 9:53 AM) General Not Present- Appetite Loss, Chills, Fatigue, Fever, Night Sweats, Weight Gain and Weight Loss. Skin Not Present- Change in Wart/Mole, Dryness, Hives, Jaundice, New Lesions, Non-Healing Wounds,  Rash and Ulcer. HEENT Present- Seasonal Allergies  and Wears glasses/contact lenses. Not Present- Earache, Hearing Loss, Hoarseness, Nose Bleed, Oral Ulcers, Ringing in the Ears, Sinus Pain, Sore Throat, Visual Disturbances and Yellow Eyes. Breast Not Present- Breast Mass, Breast Pain, Nipple Discharge and Skin Changes. Cardiovascular Not Present- Chest Pain, Difficulty Breathing Lying Down, Leg Cramps, Palpitations, Rapid Heart Rate, Shortness of Breath and Swelling of Extremities. Gastrointestinal Not Present- Abdominal Pain, Bloating, Bloody Stool, Change in Bowel Habits, Chronic diarrhea, Constipation, Difficulty Swallowing, Excessive gas, Gets full quickly at meals, Hemorrhoids, Indigestion, Nausea, Rectal Pain and Vomiting. Male Genitourinary Not Present- Blood in Urine, Change in Urinary Stream, Frequency, Impotence, Nocturia, Painful Urination, Urgency and Urine Leakage. Musculoskeletal Not Present- Back Pain, Joint Pain, Joint Stiffness, Muscle Pain, Muscle Weakness and Swelling of Extremities. Neurological Not Present- Decreased Memory, Fainting, Headaches, Numbness, Seizures, Tingling, Tremor, Trouble walking and Weakness. Psychiatric Not Present- Anxiety, Bipolar, Change in Sleep Pattern, Depression, Fearful and Frequent crying. Hematology Not Present- Blood Thinners, Easy Bruising, Excessive bleeding, Gland problems, HIV and Persistent Infections.  Vitals Alean Rinne RMA; 04/20/2017 9:13 AM) 04/20/2017 9:13 AM Weight: 260.6 lb   Height: 69 in  Body Surface Area: 2.31 m   Body Mass Index: 38.48 kg/m   Temp.: 98.2 F    Pulse: 81 (Regular)    BP: 145/82 (Sitting, Left Arm, Standard)       Physical Exam Bryan Gave M. Sareena Odeh MD; 04/20/2017 9:54 AM) The physical exam findings are as follows: Note: Constitutional: No acute distress; conversant; no deformities Eyes: Moist conjunctiva; no lid lag; anicteric sclerae; pupils equal round and reactive to light Neck: Trachea midline; no  palpable thyromegaly Lungs: Normal respiratory effort; no tactile fremitus CV: Regular rate and rhythm; no palpable thrill; no pitting edema GI: Abdomen soft, nontender, nondistended; no palpable hepatosplenomegaly MSK: Normal gait; no clubbing/cyanosis Psychiatric: Appropriate affect; alert and oriented 3 Lymphatic: No palpable cervical or axillary lymphadenopathy    Assessment & Plan Bryan Gave M. Cosette Prindle MD; 04/20/2017 10:04 AM) COLON CANCER (C18.9) Impression: Mr. Paradis is a very pleasant 67yo gentleman with multiple endoscopically unresectable colon polyps - estimated to be at prox ascending and splenic flexure. No prior abdominal surgeries  -Cardiac clearance obtained -Will plan laparoscopic vs open extended right hemicolectomy vs total abdominal colectomy with ileorectal anastomosis. We discussed that much of the final surgical plan would be based upon intraoperative findings and where the tattoos are actually located. -The anatomy and physiology of the GI tract was discussed at length with the patient and his wife. The pathophysiology of GI was discussed at length with associated pictures and illustrations. -The planned procedure, material risks (including, but not limited to, pain, bleeding, infection, scarring, need for blood transfusion, damage to surrounding structures- blood vessels/nerves/viscus/organs, damage to ureter, urine leak, leak from anastomosis, need for additional procedures, need for stoma which may be permanent, hernia, recurrence, pneumonia, blood clot, pulmonary embolism, heart attack, stroke, death) benefits and alternatives to surgery were discussed at length. The patient and his wife's questions were answered to their satisfaction and they elected to proceed with surgery.  Signed electronically by Ileana Roup, MD (04/20/2017 10:05 AM)

## 2017-05-08 IMAGING — CR DG LUMBAR SPINE COMPLETE 4+V
5 series · 5 of 5 positions shown · non-contrast
Comparison: 07/24/2013

CLINICAL DATA: Low back pain.

EXAM:
LUMBAR SPINE - COMPLETE 4+ VIEW

[AP]
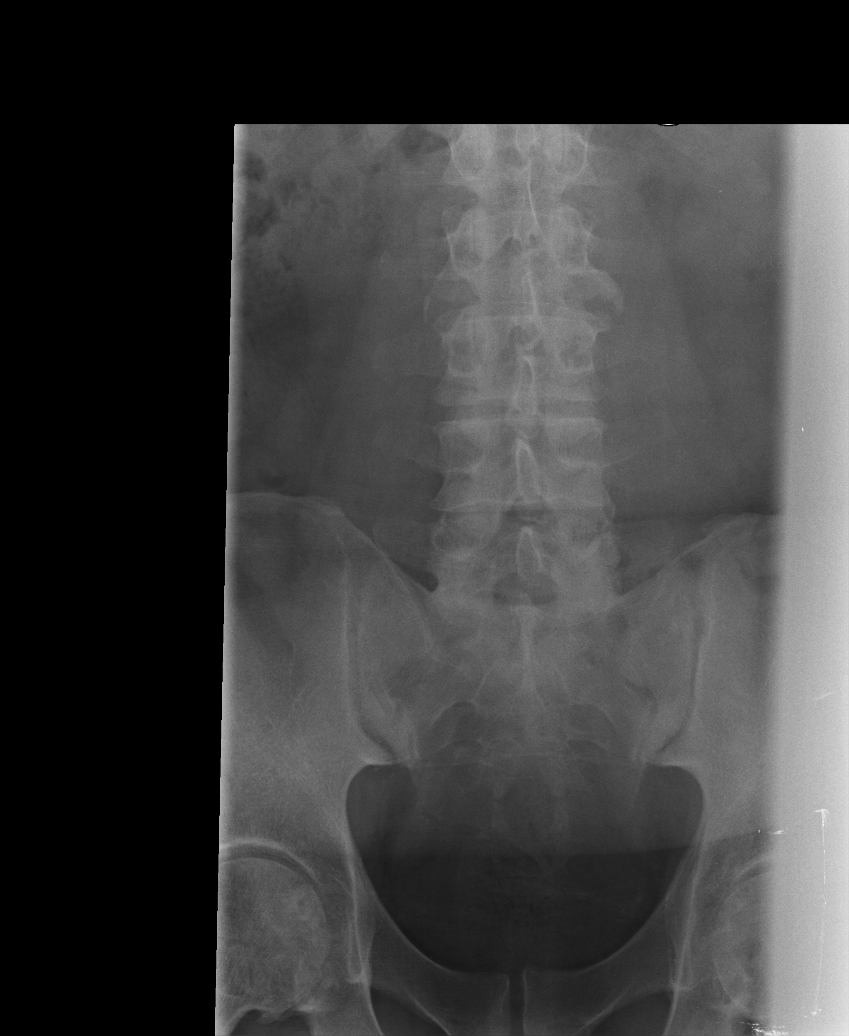

[rpo]
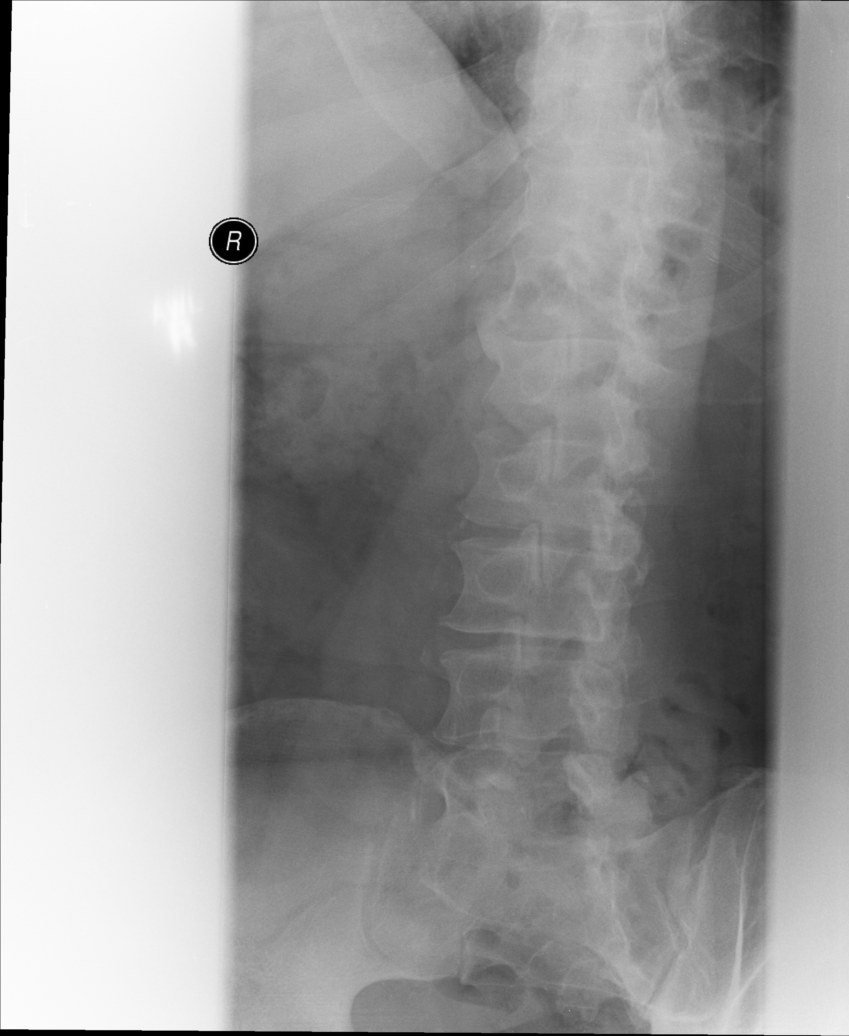

[lpo]
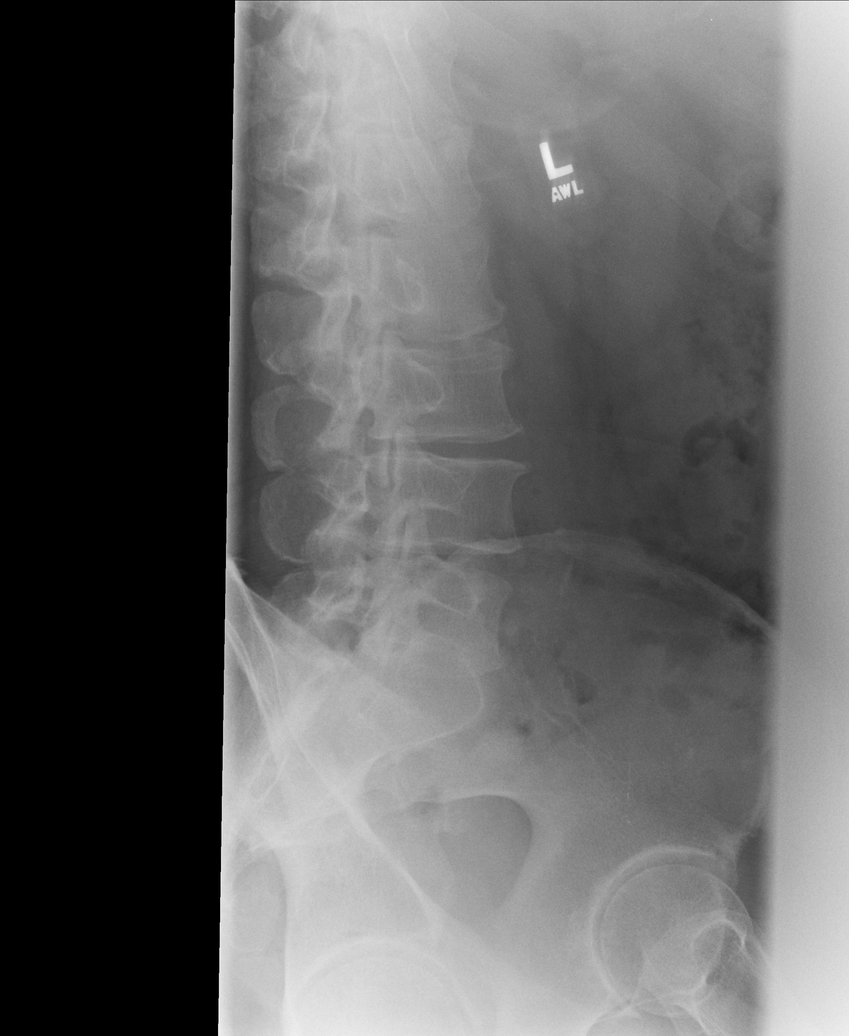

[lateral]
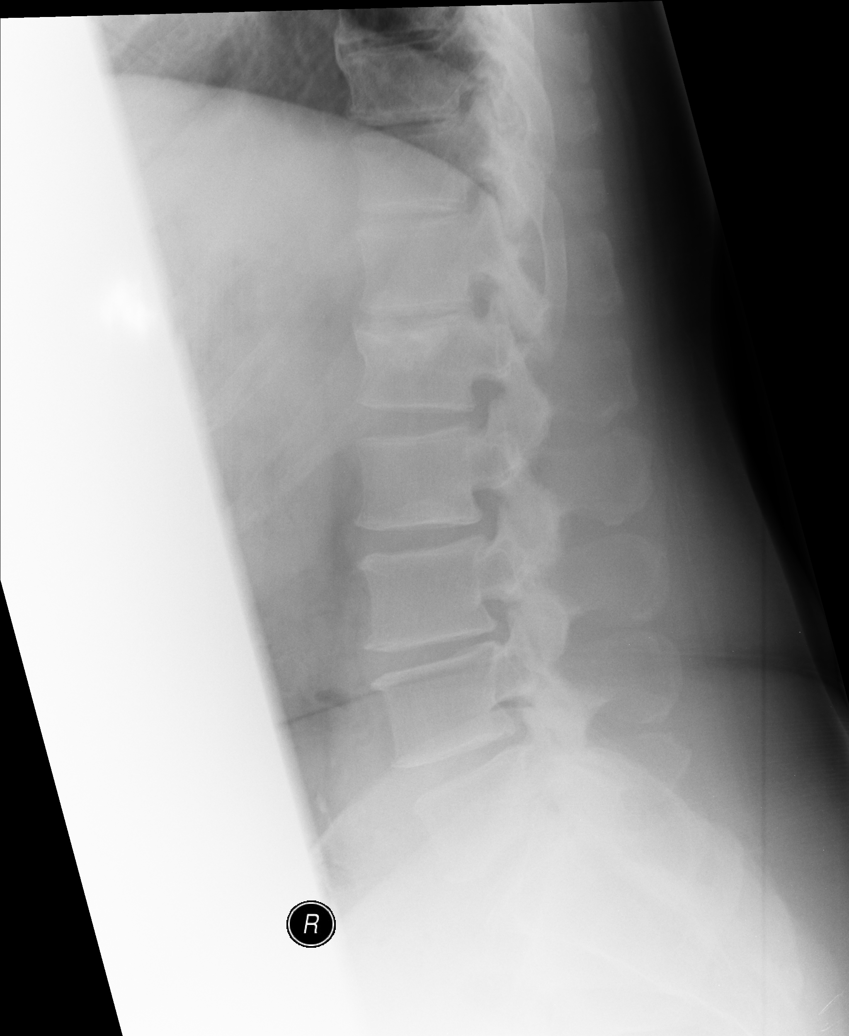

[l5 s1]
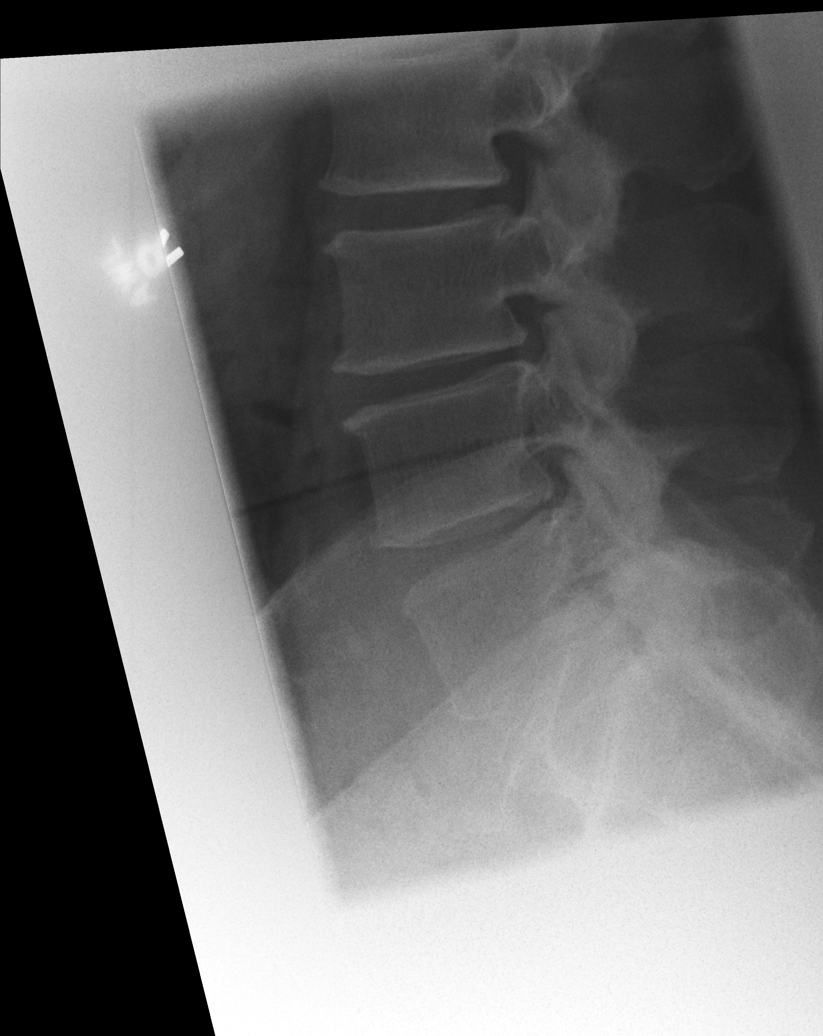

[5 of 5 positions shown; findings below may reference images not displayed]

FINDINGS: There is no evidence of acute lumbar spine fracture. Remote superior
endplate fracture deformity involving the L1 vertebra is unchanged.
Alignment is normal. Mild disc space narrowing and ventral endplate
spurring noted within the lumbar spine.
IMPRESSION: 1. No acute findings.
2. Remote L1 superior endplate fracture.

## 2017-05-29 NOTE — Patient Instructions (Addendum)
Bryan Floyd  05/29/2017   Your procedure is scheduled on: 06-05-17   Report to [facility] Main  Entrance   ARRIVE AT 530 AM. Have a seat in the Main Lobby. Please note there is a phone at the [employer]. Please call [phone] on that phone. Someone from Short Stay will come and get you from the Main Lobby and take you to Short Stay.   Call this number if you have problems the morning of surgery [phone]    Remember: DRINK 2 [place] AT 1000 PM AND 1 PRESURGERY DRINK THE DAY OF THE PROCEDURE 3 HOURS PRIOR TO SCHEDULED SURGERY. NO SOLIDS AFTER MIDNIGHT THE DAY PRIOR TO THE SURGERY. NOTHING BY MOUTH EXCEPT CLEAR LIQUIDS UNTIL THREE HOURS PRIOR TO SCHEDULED SURGERY. PLEASE FINISH PRESURGERY ENSURE DRINK PER SURGEON ORDER 3 HOURS PRIOR TO SCHEDULED SURGERY TIME WHICH NEEDS TO BE COMPLETED AT 4:30_________.    CLEAR LIQUID DIET   Foods Allowed                                                                     Foods Excluded  Coffee and tea, regular and decaf                             liquids that you cannot  Plain Jell-O in any flavor                                             see through such as: Fruit ices (not with fruit pulp)                                     milk, soups, orange juice  Iced Popsicles                                    All solid food Carbonated beverages, regular and diet                                    Cranberry, grape and apple juices Sports drinks like Gatorade Lightly seasoned clear broth or consume(fat free) Sugar, honey syrup  Sample Menu Breakfast                                Lunch                                     Supper Cranberry juice                    Beef broth  Chicken broth Jell-O                                     Grape juice                           Apple juice Coffee or tea                        Jell-O                                       Popsicle                                                Coffee or tea                        Coffee or tea  _____________________________________________________________________     Take these medicines the morning of surgery with A SIP OF WATER: Amlodipine (Norvasc), Hydralazine (Apresoline).You may also bring and use your eyedrops and inhaler as needed.                               You may not have any metal on your body including hair pins and              piercings  Do not wear jewelry,  lotions, powders or deodorant             Men may shave face and neck.   Do not bring valuables to the hospital. [place].  Contacts, dentures or bridgework may not be worn into surgery.  Leave suitcase in the car. After surgery it may be brought to your room.                 Please read over the following fact sheets you were given: _____________________________________________________________________           [facility] - Preparing for Surgery Before surgery, you can play an important role.  Because skin is not sterile, your skin needs to be as free of germs as possible.  You can reduce the number of germs on your skin by washing with CHG (chlorahexidine gluconate) soap before surgery.  CHG is an antiseptic cleaner which kills germs and bonds with the skin to continue killing germs even after washing. Please DO NOT use if you have an allergy to CHG or antibacterial soaps.  If your skin becomes reddened/irritated stop using the CHG and inform your nurse when you arrive at Short Stay. Do not shave (including legs and underarms) for at least 48 hours prior to the first CHG shower.  You may shave your face/neck. Please follow these instructions carefully:  1.  Shower with CHG Soap the night before surgery and the  morning of Surgery.  2.  If you choose to wash your hair, wash your hair first as usual with your  normal   shampoo.  3.  After you shampoo, rinse your hair and body thoroughly to remove the  shampoo.                           4.  Use CHG as you would any other liquid soap.  You can apply chg directly  to the skin and wash                       Gently with a scrungie or clean washcloth.  5.  Apply the CHG Soap to your body ONLY FROM THE NECK DOWN.   Do not use on face/ open                           Wound or open sores. Avoid contact with eyes, ears mouth and genitals (private parts).                       Wash face,  Genitals (private parts) with your normal soap.             6.  Wash thoroughly, paying special attention to the area where your surgery  will be performed.  7.  Thoroughly rinse your body with warm water from the neck down.  8.  DO NOT shower/wash with your normal soap after using and rinsing off  the CHG Soap.                9.  Pat yourself dry with a clean towel.            10.  Wear clean pajamas.            11.  Place clean sheets on your bed the night of your first shower and do not  sleep with pets. Day of Surgery : Do not apply any lotions/deodorants the morning of surgery.  Please wear clean clothes to the hospital/surgery center.  FAILURE TO FOLLOW THESE INSTRUCTIONS MAY RESULT IN THE CANCELLATION OF YOUR SURGERY PATIENT SIGNATURE_________________________________  NURSE SIGNATURE__________________________________  ________________________________________________________________________  WHAT IS A BLOOD TRANSFUSION? Blood Transfusion Information  A transfusion is the replacement of blood or some of its parts. Blood is made up of multiple cells which provide different functions.  Red blood cells carry oxygen and are used for blood loss replacement.  White blood cells fight against infection.  Platelets control bleeding.  Plasma helps clot blood.  Other blood products are available for specialized needs, such as hemophilia or other clotting disorders. BEFORE THE  TRANSFUSION  Who gives blood for transfusions?   Healthy volunteers who are fully evaluated to make sure their blood is safe. This is blood bank blood. Transfusion therapy is the safest it has ever been in the practice of medicine. Before blood is taken from a donor, a complete history is taken to make sure that person has no history of diseases nor engages in risky social behavior (examples are intravenous drug use or sexual activity with multiple partners). The donor's travel history is screened to minimize risk of transmitting infections, such as malaria. The donated blood is tested for signs of infectious diseases, such as HIV and hepatitis. The blood is then tested to be sure it is compatible with you in order to minimize the chance of a transfusion reaction. If you or a relative donates blood, this is often done in anticipation of surgery and is not appropriate for emergency situations.  It takes many days to process the donated blood. RISKS AND COMPLICATIONS Although transfusion therapy is very safe and saves many lives, the main dangers of transfusion include:   Getting an infectious disease.  Developing a transfusion reaction. This is an allergic reaction to something in the blood you were given. Every precaution is taken to prevent this. The decision to have a blood transfusion has been considered carefully by your caregiver before blood is given. Blood is not given unless the benefits outweigh the risks. AFTER THE TRANSFUSION  Right after receiving a blood transfusion, you will usually feel much better and more energetic. This is especially true if your red blood cells have gotten low (anemic). The transfusion raises the level of the red blood cells which carry oxygen, and this usually causes an energy increase.  The nurse administering the transfusion will monitor you carefully for complications. HOME CARE INSTRUCTIONS  No special instructions are needed after a transfusion. You may find  your energy is better. Speak with your caregiver about any limitations on activity for underlying diseases you may have. SEEK MEDICAL CARE IF:   Your condition is not improving after your transfusion.  You develop redness or irritation at the intravenous (IV) site. SEEK IMMEDIATE MEDICAL CARE IF:  Any of the following symptoms occur over the next 12 hours:  Shaking chills.  You have a temperature by mouth above 102 F (38.9 C), not controlled by medicine.  Chest, back, or muscle pain.  People around you feel you are not acting correctly or are confused.  Shortness of breath or difficulty breathing.  Dizziness and fainting.  You get a rash or develop hives.  You have a decrease in urine output.  Your urine turns a dark color or changes to pink, red, or brown. Any of the following symptoms occur over the next 10 days:  You have a temperature by mouth above 102 F (38.9 C), not controlled by medicine.  Shortness of breath.  Weakness after normal activity.  The white part of the eye turns yellow (jaundice).  You have a decrease in the amount of urine or are urinating less often.  Your urine turns a dark color or changes to pink, red, or brown. Document Released: 02/08/2000 Document Revised: 05/05/2011 Document Reviewed: 09/27/2007 [facility] Patient Information 2014 [place], [place].  _______________________________________________________________________

## 2017-05-29 NOTE — Progress Notes (Signed)
04-02-17 (Epic) Cardiac clearance Dr. Geraldo Pitter  03-02-17 (Epic) EKG  03-18-17 (Epic) Stress  01-29-17 (Epic) CT chest w/contrast

## 2017-06-01 ENCOUNTER — Encounter (HOSPITAL_COMMUNITY)
Admission: RE | Admit: 2017-06-01 | Discharge: 2017-06-01 | Disposition: A | Discharge status: Home / Self Care | Payer: PPO | Source: Ambulatory Visit | Attending: Surgery | Admitting: Surgery

## 2017-06-01 ENCOUNTER — Other Ambulatory Visit: Payer: Self-pay

## 2017-06-01 ENCOUNTER — Encounter (HOSPITAL_COMMUNITY): Payer: Self-pay

## 2017-06-01 DIAGNOSIS — Z01812 Encounter for preprocedural laboratory examination: Secondary | ICD-10-CM | POA: Diagnosis not present

## 2017-06-01 LAB — CBC WITH DIFFERENTIAL/PLATELET
Basophils Absolute: 0.1 10*3/uL (ref 0.0–0.1)
Basophils Relative: 2 %
Eosinophils Absolute: 0.6 10*3/uL (ref 0.0–0.7)
Eosinophils Relative: 14 %
HCT: 37.3 % — ABNORMAL LOW (ref 39.0–52.0)
Hemoglobin: 12.6 g/dL — ABNORMAL LOW (ref 13.0–17.0)
Lymphocytes Relative: 41 %
Lymphs Abs: 1.9 10*3/uL (ref 0.7–4.0)
MCH: 30.2 pg (ref 26.0–34.0)
MCHC: 33.8 g/dL (ref 30.0–36.0)
MCV: 89.4 fL (ref 78.0–100.0)
Monocytes Absolute: 0.5 10*3/uL (ref 0.1–1.0)
Monocytes Relative: 11 %
Neutro Abs: 1.5 10*3/uL — ABNORMAL LOW (ref 1.7–7.7)
Neutrophils Relative %: 32 %
Platelets: 318 10*3/uL (ref 150–400)
RBC: 4.17 MIL/uL — ABNORMAL LOW (ref 4.22–5.81)
RDW: 16 % — ABNORMAL HIGH (ref 11.5–15.5)
WBC: 4.6 10*3/uL (ref 4.0–10.5)

## 2017-06-01 LAB — COMPREHENSIVE METABOLIC PANEL
ALT: 18 U/L (ref 17–63)
AST: 23 U/L (ref 15–41)
Albumin: 3.9 g/dL (ref 3.5–5.0)
Alkaline Phosphatase: 70 U/L (ref 38–126)
Anion gap: 9 (ref 5–15)
BUN: 13 mg/dL (ref 6–20)
CO2: 23 mmol/L (ref 22–32)
Calcium: 9.9 mg/dL (ref 8.9–10.3)
Chloride: 108 mmol/L (ref 101–111)
Creatinine, Ser: 0.92 mg/dL (ref 0.61–1.24)
GFR calc Af Amer: >60 mL/min (ref >60)
GFR calc non Af Amer: >60 mL/min (ref >60)
Glucose, Bld: 99 mg/dL (ref 65–99)
Potassium: 3.8 mmol/L (ref 3.5–5.1)
Sodium: 140 mmol/L (ref 135–145)
Total Bilirubin: 0.6 mg/dL (ref 0.3–1.2)
Total Protein: 7.3 g/dL (ref 6.5–8.1)

## 2017-06-01 LAB — PROTIME-INR
INR: 1.09
Prothrombin Time: 14 seconds (ref 11.4–15.2)

## 2017-06-01 LAB — APTT: aPTT: 36 seconds (ref 24–36)

## 2017-06-01 NOTE — Consult Note (Signed)
[facility] Nurse requested for preoperative stoma site marking Extended right hemicolectomy and possible transverse colectomy.    Discussed surgical procedure and stoma creation with patient and family.  Explained role of the [place] nurse team.  Provided the patient with educational booklet and provided samples of pouching options.  Answered patient and family questions.  Discussed pouch options, emptying and active lifestyle with ostomy.   Examined patient lying, sitting, and standing in order to place the marking in the patient's visual field, away from any creases or abdominal contour issues and within the rectus muscle.  Attempted to mark above the patient's belt line.   Marked for colostomy in the LLQ  4 cm to the left of the umbilicus and  3 cm above the umbilicus.  In addition, marked 4 cm right and 3 cm above umbilicus due to transverse resection.    Patient's abdomen cleansed with CHG wipes at site markings, allowed to air dry prior to marking.Covered mark with thin film transparent dressing to preserve mark until date of surgery.   [facility] [place] [facility] Nurse team will follow up with patient after surgery for continue ostomy care and teaching.   Domenic Moras RN BSN [place] Pager [phone]

## 2017-06-05 ENCOUNTER — Encounter (HOSPITAL_COMMUNITY)
Admission: RE | Disposition: A | Discharge status: Home / Self Care | Payer: Self-pay | Source: Ambulatory Visit | Attending: Surgery

## 2017-06-05 ENCOUNTER — Encounter (HOSPITAL_COMMUNITY): Payer: Self-pay

## 2017-06-05 ENCOUNTER — Inpatient Hospital Stay (HOSPITAL_COMMUNITY)
Admission: RE | Admit: 2017-06-05 | Discharge: 2017-06-19 | DRG: 330 | Disposition: A | Discharge status: Home / Self Care | Payer: PPO | Source: Ambulatory Visit | Attending: Surgery | Admitting: Surgery

## 2017-06-05 ENCOUNTER — Inpatient Hospital Stay (HOSPITAL_COMMUNITY): Payer: PPO | Admitting: Anesthesiology

## 2017-06-05 DIAGNOSIS — Z809 Family history of malignant neoplasm, unspecified: Secondary | ICD-10-CM

## 2017-06-05 DIAGNOSIS — I10 Essential (primary) hypertension: Secondary | ICD-10-CM | POA: Diagnosis present

## 2017-06-05 DIAGNOSIS — Z9889 Other specified postprocedural states: Secondary | ICD-10-CM

## 2017-06-05 DIAGNOSIS — R066 Hiccough: Secondary | ICD-10-CM | POA: Diagnosis not present

## 2017-06-05 DIAGNOSIS — K567 Ileus, unspecified: Secondary | ICD-10-CM

## 2017-06-05 DIAGNOSIS — C189 Malignant neoplasm of colon, unspecified: Secondary | ICD-10-CM | POA: Diagnosis present

## 2017-06-05 DIAGNOSIS — D3A8 Other benign neuroendocrine tumors: Secondary | ICD-10-CM | POA: Diagnosis present

## 2017-06-05 DIAGNOSIS — E785 Hyperlipidemia, unspecified: Secondary | ICD-10-CM | POA: Diagnosis not present

## 2017-06-05 DIAGNOSIS — E876 Hypokalemia: Secondary | ICD-10-CM | POA: Diagnosis not present

## 2017-06-05 DIAGNOSIS — H409 Unspecified glaucoma: Secondary | ICD-10-CM | POA: Diagnosis present

## 2017-06-05 DIAGNOSIS — I160 Hypertensive urgency: Secondary | ICD-10-CM | POA: Diagnosis not present

## 2017-06-05 DIAGNOSIS — K660 Peritoneal adhesions (postprocedural) (postinfection): Secondary | ICD-10-CM | POA: Diagnosis present

## 2017-06-05 DIAGNOSIS — R112 Nausea with vomiting, unspecified: Secondary | ICD-10-CM | POA: Diagnosis not present

## 2017-06-05 DIAGNOSIS — Z4682 Encounter for fitting and adjustment of non-vascular catheter: Secondary | ICD-10-CM | POA: Diagnosis not present

## 2017-06-05 DIAGNOSIS — K635 Polyp of colon: Secondary | ICD-10-CM | POA: Diagnosis not present

## 2017-06-05 DIAGNOSIS — C183 Malignant neoplasm of hepatic flexure: Secondary | ICD-10-CM | POA: Diagnosis not present

## 2017-06-05 DIAGNOSIS — R7401 Elevation of levels of liver transaminase levels: Secondary | ICD-10-CM

## 2017-06-05 DIAGNOSIS — E871 Hypo-osmolality and hyponatremia: Secondary | ICD-10-CM | POA: Diagnosis not present

## 2017-06-05 DIAGNOSIS — Z823 Family history of stroke: Secondary | ICD-10-CM

## 2017-06-05 DIAGNOSIS — Z8249 Family history of ischemic heart disease and other diseases of the circulatory system: Secondary | ICD-10-CM

## 2017-06-05 DIAGNOSIS — Z9079 Acquired absence of other genital organ(s): Secondary | ICD-10-CM | POA: Diagnosis not present

## 2017-06-05 DIAGNOSIS — E669 Obesity, unspecified: Secondary | ICD-10-CM | POA: Diagnosis present

## 2017-06-05 DIAGNOSIS — D123 Benign neoplasm of transverse colon: Secondary | ICD-10-CM | POA: Diagnosis not present

## 2017-06-05 DIAGNOSIS — D62 Acute posthemorrhagic anemia: Secondary | ICD-10-CM | POA: Diagnosis not present

## 2017-06-05 DIAGNOSIS — R740 Nonspecific elevation of levels of transaminase and lactic acid dehydrogenase [LDH]: Secondary | ICD-10-CM

## 2017-06-05 DIAGNOSIS — Z6837 Body mass index (BMI) 37.0-37.9, adult: Secondary | ICD-10-CM | POA: Diagnosis not present

## 2017-06-05 DIAGNOSIS — D122 Benign neoplasm of ascending colon: Secondary | ICD-10-CM | POA: Diagnosis not present

## 2017-06-05 DIAGNOSIS — C184 Malignant neoplasm of transverse colon: Secondary | ICD-10-CM | POA: Diagnosis not present

## 2017-06-05 DIAGNOSIS — N400 Enlarged prostate without lower urinary tract symptoms: Secondary | ICD-10-CM | POA: Diagnosis not present

## 2017-06-05 HISTORY — PX: LAPAROSCOPIC RIGHT HEMI COLECTOMY: SHX5926

## 2017-06-05 LAB — CBC
HCT: 41.4 % (ref 39.0–52.0)
Hemoglobin: 14.3 g/dL (ref 13.0–17.0)
MCH: 30.6 pg (ref 26.0–34.0)
MCHC: 34.5 g/dL (ref 30.0–36.0)
MCV: 88.5 fL (ref 78.0–100.0)
Platelets: 319 10*3/uL (ref 150–400)
RBC: 4.68 MIL/uL (ref 4.22–5.81)
RDW: 15.6 % — ABNORMAL HIGH (ref 11.5–15.5)
WBC: 10.6 10*3/uL — ABNORMAL HIGH (ref 4.0–10.5)

## 2017-06-05 LAB — CREATININE, SERUM
Creatinine, Ser: 1.27 mg/dL — ABNORMAL HIGH (ref 0.61–1.24)
GFR calc Af Amer: >60 mL/min (ref >60)
GFR calc non Af Amer: 57 mL/min — ABNORMAL LOW (ref >60)

## 2017-06-05 LAB — TYPE AND SCREEN
ABO/RH(D): O POS
Antibody Screen: NEGATIVE

## 2017-06-05 SURGERY — LAPAROSCOPIC RIGHT HEMI COLECTOMY
Anesthesia: Spinal | Site: Abdomen | Laterality: Right

## 2017-06-05 MED ORDER — ALVIMOPAN 12 MG PO CAPS
12.0000 mg | ORAL_CAPSULE | ORAL | Status: AC
  Administered 2017-06-05: 12 mg via ORAL

## 2017-06-05 MED ORDER — MIDAZOLAM HCL 2 MG/2ML IJ SOLN
INTRAMUSCULAR | Status: AC
  Filled 2017-06-05: qty 2

## 2017-06-05 MED ORDER — PHENYLEPHRINE 40 MCG/ML (10ML) SYRINGE FOR IV PUSH (FOR BLOOD PRESSURE SUPPORT)
PREFILLED_SYRINGE | INTRAVENOUS | Status: AC
  Filled 2017-06-05: qty 10

## 2017-06-05 MED ORDER — BRIMONIDINE TARTRATE-TIMOLOL 0.2-0.5 % OP SOLN
1.0000 [drp] | Freq: Two times a day (BID) | OPHTHALMIC | Status: DC
  Filled 2017-06-05: qty 5

## 2017-06-05 MED ORDER — LACTATED RINGERS IR SOLN
Status: DC | PRN
  Administered 2017-06-05: 1000 mL

## 2017-06-05 MED ORDER — GABAPENTIN 300 MG PO CAPS
ORAL_CAPSULE | ORAL | Status: AC
  Filled 2017-06-05: qty 1

## 2017-06-05 MED ORDER — NEOMYCIN SULFATE 500 MG PO TABS
1000.0000 mg | ORAL_TABLET | ORAL | Status: DC
  Filled 2017-06-05: qty 2

## 2017-06-05 MED ORDER — PROPOFOL 10 MG/ML IV BOLUS
INTRAVENOUS | Status: DC | PRN
  Administered 2017-06-05: 200 mg via INTRAVENOUS

## 2017-06-05 MED ORDER — FENTANYL CITRATE (PF) 100 MCG/2ML IJ SOLN
INTRAMUSCULAR | Status: DC | PRN
  Administered 2017-06-05 (×3): 25 ug via INTRAVENOUS
  Administered 2017-06-05: 50 ug via INTRAVENOUS
  Administered 2017-06-05 (×3): 25 ug via INTRAVENOUS

## 2017-06-05 MED ORDER — TIMOLOL MALEATE 0.5 % OP SOLN
1.0000 [drp] | Freq: Two times a day (BID) | OPHTHALMIC | Status: DC
  Administered 2017-06-05 – 2017-06-19 (×27): 1 [drp] via OPHTHALMIC
  Filled 2017-06-05: qty 5

## 2017-06-05 MED ORDER — BUPIVACAINE LIPOSOME 1.3 % IJ SUSP
INTRAMUSCULAR | Status: DC | PRN
  Administered 2017-06-05: 20 mL

## 2017-06-05 MED ORDER — FENTANYL CITRATE (PF) 100 MCG/2ML IJ SOLN
25.0000 ug | INTRAMUSCULAR | Status: DC | PRN
  Administered 2017-06-05 (×3): 50 ug via INTRAVENOUS

## 2017-06-05 MED ORDER — ACETAMINOPHEN 500 MG PO TABS
1000.0000 mg | ORAL_TABLET | Freq: Four times a day (QID) | ORAL | Status: AC
  Administered 2017-06-05 – 2017-06-06 (×4): 1000 mg via ORAL
  Filled 2017-06-05 (×4): qty 2

## 2017-06-05 MED ORDER — LIDOCAINE 2% (20 MG/ML) 5 ML SYRINGE
INTRAMUSCULAR | Status: AC
  Filled 2017-06-05: qty 5

## 2017-06-05 MED ORDER — LATANOPROST 0.005 % OP SOLN
1.0000 [drp] | Freq: Every day | OPHTHALMIC | Status: DC
  Administered 2017-06-05 – 2017-06-18 (×14): 1 [drp] via OPHTHALMIC
  Filled 2017-06-05: qty 2.5

## 2017-06-05 MED ORDER — EPHEDRINE SULFATE-NACL 50-0.9 MG/10ML-% IV SOSY
PREFILLED_SYRINGE | INTRAVENOUS | Status: DC | PRN
  Administered 2017-06-05 (×2): 10 mg via INTRAVENOUS

## 2017-06-05 MED ORDER — ENSURE SURGERY PO LIQD
237.0000 mL | Freq: Two times a day (BID) | ORAL | Status: DC
  Administered 2017-06-06: 237 mL via ORAL
  Filled 2017-06-05 (×5): qty 237

## 2017-06-05 MED ORDER — POLYETHYLENE GLYCOL 3350 17 GM/SCOOP PO POWD
1.0000 | Freq: Once | ORAL | Status: DC
  Filled 2017-06-05: qty 255

## 2017-06-05 MED ORDER — ROSUVASTATIN CALCIUM 5 MG PO TABS
5.0000 mg | ORAL_TABLET | Freq: Every day | ORAL | Status: DC
  Administered 2017-06-05 – 2017-06-18 (×10): 5 mg via ORAL
  Filled 2017-06-05 (×10): qty 1

## 2017-06-05 MED ORDER — ACETAMINOPHEN 500 MG PO TABS
ORAL_TABLET | ORAL | Status: AC
  Filled 2017-06-05: qty 2

## 2017-06-05 MED ORDER — HYDROMORPHONE HCL 1 MG/ML IJ SOLN
0.5000 mg | INTRAMUSCULAR | Status: DC | PRN
  Administered 2017-06-05 – 2017-06-06 (×2): 0.5 mg via INTRAVENOUS
  Filled 2017-06-05 (×2): qty 0.5

## 2017-06-05 MED ORDER — IPRATROPIUM BROMIDE 0.06 % NA SOLN: 1.0000 | Freq: Every day | NASAL | Status: DC | PRN

## 2017-06-05 MED ORDER — PHENYLEPHRINE 40 MCG/ML (10ML) SYRINGE FOR IV PUSH (FOR BLOOD PRESSURE SUPPORT)
PREFILLED_SYRINGE | INTRAVENOUS | Status: DC | PRN
  Administered 2017-06-05: 40 ug via INTRAVENOUS
  Administered 2017-06-05: 120 ug via INTRAVENOUS
  Administered 2017-06-05: 80 ug via INTRAVENOUS
  Administered 2017-06-05 (×4): 40 ug via INTRAVENOUS

## 2017-06-05 MED ORDER — LATANOPROSTENE BUNOD 0.024 % OP SOLN: 1.0000 [drp] | Freq: Every evening | OPHTHALMIC | Status: DC

## 2017-06-05 MED ORDER — ROCURONIUM BROMIDE 10 MG/ML (PF) SYRINGE
PREFILLED_SYRINGE | INTRAVENOUS | Status: AC
  Filled 2017-06-05: qty 5

## 2017-06-05 MED ORDER — DEXAMETHASONE SODIUM PHOSPHATE 10 MG/ML IJ SOLN
INTRAMUSCULAR | Status: DC | PRN
  Administered 2017-06-05: 10 mg via INTRAVENOUS

## 2017-06-05 MED ORDER — LIDOCAINE 2% (20 MG/ML) 5 ML SYRINGE
INTRAMUSCULAR | Status: DC | PRN
Start: 2017-06-05 — End: 2017-06-05
  Administered 2017-06-05: 100 mg via INTRAVENOUS

## 2017-06-05 MED ORDER — TRAMADOL HCL 50 MG PO TABS
50.0000 mg | ORAL_TABLET | Freq: Four times a day (QID) | ORAL | Status: DC | PRN
  Administered 2017-06-05: 50 mg via ORAL
  Filled 2017-06-05 (×2): qty 1

## 2017-06-05 MED ORDER — IRBESARTAN 75 MG PO TABS
37.5000 mg | ORAL_TABLET | Freq: Every day | ORAL | Status: DC
Start: 2017-06-05 — End: 2017-06-19
  Administered 2017-06-05 – 2017-06-19 (×11): 37.5 mg via ORAL
  Filled 2017-06-05 (×11): qty 1

## 2017-06-05 MED ORDER — ALVIMOPAN 12 MG PO CAPS
12.0000 mg | ORAL_CAPSULE | Freq: Two times a day (BID) | ORAL | Status: DC
  Administered 2017-06-06 (×2): 12 mg via ORAL
  Filled 2017-06-05 (×2): qty 1

## 2017-06-05 MED ORDER — ALVIMOPAN 12 MG PO CAPS
ORAL_CAPSULE | ORAL | Status: AC
  Filled 2017-06-05: qty 1

## 2017-06-05 MED ORDER — FENTANYL CITRATE (PF) 100 MCG/2ML IJ SOLN
INTRAMUSCULAR | Status: AC
  Filled 2017-06-05: qty 2

## 2017-06-05 MED ORDER — DEXAMETHASONE SODIUM PHOSPHATE 10 MG/ML IJ SOLN
INTRAMUSCULAR | Status: AC
  Filled 2017-06-05: qty 1

## 2017-06-05 MED ORDER — MIDAZOLAM HCL 2 MG/2ML IJ SOLN
INTRAMUSCULAR | Status: DC | PRN
  Administered 2017-06-05 (×2): 1 mg via INTRAVENOUS

## 2017-06-05 MED ORDER — ONDANSETRON HCL 4 MG/2ML IJ SOLN
INTRAMUSCULAR | Status: DC | PRN
  Administered 2017-06-05: 4 mg via INTRAVENOUS

## 2017-06-05 MED ORDER — DIPHENHYDRAMINE HCL 50 MG/ML IJ SOLN: 12.5000 mg | Freq: Four times a day (QID) | INTRAMUSCULAR | Status: DC | PRN

## 2017-06-05 MED ORDER — ROCURONIUM BROMIDE 10 MG/ML (PF) SYRINGE
PREFILLED_SYRINGE | INTRAVENOUS | Status: DC | PRN
  Administered 2017-06-05: 20 mg via INTRAVENOUS
  Administered 2017-06-05: 50 mg via INTRAVENOUS
  Administered 2017-06-05: 20 mg via INTRAVENOUS
  Administered 2017-06-05 (×3): 10 mg via INTRAVENOUS

## 2017-06-05 MED ORDER — HEPARIN SODIUM (PORCINE) 5000 UNIT/ML IJ SOLN
5000.0000 [IU] | Freq: Once | INTRAMUSCULAR | Status: AC
  Administered 2017-06-05: 5000 [IU] via SUBCUTANEOUS

## 2017-06-05 MED ORDER — CEFOTETAN DISODIUM-DEXTROSE 2-2.08 GM-%(50ML) IV SOLR
INTRAVENOUS | Status: AC
  Filled 2017-06-05: qty 50

## 2017-06-05 MED ORDER — DIPHENHYDRAMINE HCL 12.5 MG/5ML PO ELIX: 12.5000 mg | ORAL_SOLUTION | Freq: Four times a day (QID) | ORAL | Status: DC | PRN

## 2017-06-05 MED ORDER — SUCCINYLCHOLINE CHLORIDE 200 MG/10ML IV SOSY
PREFILLED_SYRINGE | INTRAVENOUS | Status: AC
  Filled 2017-06-05: qty 10

## 2017-06-05 MED ORDER — TAMSULOSIN HCL 0.4 MG PO CAPS
0.4000 mg | ORAL_CAPSULE | Freq: Every day | ORAL | Status: DC
  Administered 2017-06-05 – 2017-06-18 (×10): 0.4 mg via ORAL
  Filled 2017-06-05 (×10): qty 1

## 2017-06-05 MED ORDER — HEPARIN SODIUM (PORCINE) 5000 UNIT/ML IJ SOLN
INTRAMUSCULAR | Status: AC
  Filled 2017-06-05: qty 1

## 2017-06-05 MED ORDER — FINASTERIDE 5 MG PO TABS
5.0000 mg | ORAL_TABLET | Freq: Every day | ORAL | Status: DC
  Administered 2017-06-06 – 2017-06-19 (×10): 5 mg via ORAL
  Filled 2017-06-05 (×10): qty 1

## 2017-06-05 MED ORDER — EPHEDRINE 5 MG/ML INJ
INTRAVENOUS | Status: AC
  Filled 2017-06-05: qty 10

## 2017-06-05 MED ORDER — HYDROCHLOROTHIAZIDE 25 MG PO TABS
25.0000 mg | ORAL_TABLET | Freq: Every day | ORAL | Status: DC
  Administered 2017-06-05 – 2017-06-19 (×11): 25 mg via ORAL
  Filled 2017-06-05 (×12): qty 1

## 2017-06-05 MED ORDER — HEPARIN SODIUM (PORCINE) 5000 UNIT/ML IJ SOLN
5000.0000 [IU] | Freq: Three times a day (TID) | INTRAMUSCULAR | Status: DC
  Administered 2017-06-05 – 2017-06-16 (×32): 5000 [IU] via SUBCUTANEOUS
  Filled 2017-06-05 (×32): qty 1

## 2017-06-05 MED ORDER — LACTATED RINGERS IV SOLN
INTRAVENOUS | Status: DC
  Administered 2017-06-05 – 2017-06-06 (×3): via INTRAVENOUS
  Administered 2017-06-06: 1000 mL via INTRAVENOUS
  Administered 2017-06-07 – 2017-06-09 (×4): via INTRAVENOUS

## 2017-06-05 MED ORDER — BUPIVACAINE-EPINEPHRINE (PF) 0.5% -1:200000 IJ SOLN
INTRAMUSCULAR | Status: DC | PRN
  Administered 2017-06-05: 15 mL

## 2017-06-05 MED ORDER — IBUPROFEN 200 MG PO TABS
600.0000 mg | ORAL_TABLET | Freq: Four times a day (QID) | ORAL | Status: DC
  Administered 2017-06-05 – 2017-06-08 (×12): 600 mg via ORAL
  Filled 2017-06-05 (×12): qty 3

## 2017-06-05 MED ORDER — BRIMONIDINE TARTRATE 0.2 % OP SOLN
1.0000 [drp] | Freq: Two times a day (BID) | OPHTHALMIC | Status: DC
  Administered 2017-06-06 – 2017-06-19 (×26): 1 [drp] via OPHTHALMIC
  Filled 2017-06-05: qty 5

## 2017-06-05 MED ORDER — SODIUM CHLORIDE 0.9 % IJ SOLN
INTRAMUSCULAR | Status: DC | PRN
  Administered 2017-06-05: 15 mL

## 2017-06-05 MED ORDER — GABAPENTIN 300 MG PO CAPS
300.0000 mg | ORAL_CAPSULE | ORAL | Status: AC
  Administered 2017-06-05: 300 mg via ORAL

## 2017-06-05 MED ORDER — SUGAMMADEX SODIUM 200 MG/2ML IV SOLN
INTRAVENOUS | Status: AC
  Filled 2017-06-05: qty 2

## 2017-06-05 MED ORDER — HYDRALAZINE HCL 10 MG PO TABS
10.0000 mg | ORAL_TABLET | Freq: Three times a day (TID) | ORAL | Status: DC
  Administered 2017-06-05 – 2017-06-19 (×30): 10 mg via ORAL
  Filled 2017-06-05 (×30): qty 1

## 2017-06-05 MED ORDER — SACCHAROMYCES BOULARDII 250 MG PO CAPS
250.0000 mg | ORAL_CAPSULE | Freq: Two times a day (BID) | ORAL | Status: DC
  Administered 2017-06-05 – 2017-06-19 (×19): 250 mg via ORAL
  Filled 2017-06-05 (×19): qty 1

## 2017-06-05 MED ORDER — CHLORHEXIDINE GLUCONATE CLOTH 2 % EX PADS: 6.0000 | MEDICATED_PAD | Freq: Once | CUTANEOUS | Status: DC

## 2017-06-05 MED ORDER — LIDOCAINE 2% (20 MG/ML) 5 ML SYRINGE
INTRAMUSCULAR | Status: DC | PRN
  Administered 2017-06-05: 1.5 mg/kg/h via INTRAVENOUS

## 2017-06-05 MED ORDER — BUPIVACAINE-EPINEPHRINE (PF) 0.5% -1:200000 IJ SOLN
INTRAMUSCULAR | Status: AC
  Filled 2017-06-05: qty 30

## 2017-06-05 MED ORDER — ONDANSETRON HCL 4 MG/2ML IJ SOLN
4.0000 mg | Freq: Four times a day (QID) | INTRAMUSCULAR | Status: DC | PRN
  Administered 2017-06-06 – 2017-06-10 (×3): 4 mg via INTRAVENOUS
  Filled 2017-06-05 (×4): qty 2

## 2017-06-05 MED ORDER — LACTATED RINGERS IV SOLN
INTRAVENOUS | Status: DC
  Administered 2017-06-05: 1000 mL via INTRAVENOUS
  Administered 2017-06-05: 08:00:00 via INTRAVENOUS

## 2017-06-05 MED ORDER — LIDOCAINE HCL 2 % IJ SOLN
INTRAMUSCULAR | Status: AC
  Filled 2017-06-05: qty 20

## 2017-06-05 MED ORDER — CEFOTETAN DISODIUM-DEXTROSE 2-2.08 GM-%(50ML) IV SOLR
2.0000 g | INTRAVENOUS | Status: AC
  Administered 2017-06-05: 2 g via INTRAVENOUS

## 2017-06-05 MED ORDER — SODIUM CHLORIDE 0.9 % IR SOLN
Status: DC | PRN
  Administered 2017-06-05: 2000 mL

## 2017-06-05 MED ORDER — ONDANSETRON HCL 4 MG/2ML IJ SOLN
INTRAMUSCULAR | Status: AC
  Filled 2017-06-05: qty 2

## 2017-06-05 MED ORDER — PROPOFOL 10 MG/ML IV BOLUS
INTRAVENOUS | Status: AC
  Filled 2017-06-05: qty 20

## 2017-06-05 MED ORDER — SODIUM CHLORIDE 0.9 % IJ SOLN
INTRAMUSCULAR | Status: AC
  Filled 2017-06-05: qty 50

## 2017-06-05 MED ORDER — AMLODIPINE BESYLATE 10 MG PO TABS
10.0000 mg | ORAL_TABLET | Freq: Every day | ORAL | Status: DC
  Administered 2017-06-06 – 2017-06-14 (×5): 10 mg via ORAL
  Filled 2017-06-05 (×7): qty 1

## 2017-06-05 MED ORDER — BUPIVACAINE LIPOSOME 1.3 % IJ SUSP
20.0000 mL | Freq: Once | INTRAMUSCULAR | Status: DC
  Filled 2017-06-05: qty 20

## 2017-06-05 MED ORDER — METRONIDAZOLE 500 MG PO TABS
1000.0000 mg | ORAL_TABLET | ORAL | Status: DC
  Filled 2017-06-05: qty 2

## 2017-06-05 MED ORDER — ONDANSETRON HCL 4 MG PO TABS: 4.0000 mg | ORAL_TABLET | Freq: Four times a day (QID) | ORAL | Status: DC | PRN

## 2017-06-05 MED ORDER — ALUM & MAG HYDROXIDE-SIMETH 200-200-20 MG/5ML PO SUSP
30.0000 mL | Freq: Four times a day (QID) | ORAL | Status: DC | PRN
  Administered 2017-06-09 (×3): 30 mL via ORAL
  Filled 2017-06-05 (×3): qty 30

## 2017-06-05 MED ORDER — ACETAMINOPHEN 500 MG PO TABS
1000.0000 mg | ORAL_TABLET | ORAL | Status: AC
  Administered 2017-06-05: 1000 mg via ORAL

## 2017-06-05 MED ORDER — SUGAMMADEX SODIUM 500 MG/5ML IV SOLN
INTRAVENOUS | Status: DC | PRN
  Administered 2017-06-05: 300 mg via INTRAVENOUS

## 2017-06-05 MED ORDER — SUGAMMADEX SODIUM 500 MG/5ML IV SOLN
INTRAVENOUS | Status: AC
  Filled 2017-06-05: qty 5

## 2017-06-05 SURGICAL SUPPLY — 58 items
APPLIER CLIP ROT 10 11.4 M/L (STAPLE)
BLADE CLIPPER SURG (BLADE) IMPLANT
CELLS DAT CNTRL 66122 CELL SVR (MISCELLANEOUS) IMPLANT
CHLORAPREP W/TINT 26ML (MISCELLANEOUS) ×3 IMPLANT
CLIP APPLIE ROT 10 11.4 M/L (STAPLE) IMPLANT
DECANTER SPIKE VIAL GLASS SM (MISCELLANEOUS) ×3 IMPLANT
DERMABOND ADVANCED (GAUZE/BANDAGES/DRESSINGS) ×1
DERMABOND ADVANCED .7 DNX12 (GAUZE/BANDAGES/DRESSINGS) ×2 IMPLANT
DISSECTOR BLUNT TIP ENDO 5MM (MISCELLANEOUS) IMPLANT
DRAPE UTILITY XL STRL (DRAPES) ×3 IMPLANT
DRSG OPSITE POSTOP 4X6 (GAUZE/BANDAGES/DRESSINGS) ×3 IMPLANT
ELECT REM PT RETURN 15FT ADLT (MISCELLANEOUS) ×3 IMPLANT
GAUZE SPONGE 4X4 12PLY STRL (GAUZE/BANDAGES/DRESSINGS) ×3 IMPLANT
GLOVE BIO SURGEON STRL SZ7.5 (GLOVE) ×6 IMPLANT
GLOVE ECLIPSE 8.0 STRL XLNG CF (GLOVE) ×6 IMPLANT
GOWN STRL REUS W/TWL XL LVL3 (GOWN DISPOSABLE) ×24 IMPLANT
LIGASURE IMPACT 36 18CM CVD LR (INSTRUMENTS) IMPLANT
NS IRRIG 1000ML POUR BTL (IV SOLUTION) ×3 IMPLANT
PACK COLON (CUSTOM PROCEDURE TRAY) ×3 IMPLANT
PAD ARMBOARD 7.5X6 YLW CONV (MISCELLANEOUS) ×12 IMPLANT
PAD POSITIONING PINK XL (MISCELLANEOUS) ×3 IMPLANT
PORT LAP GEL ALEXIS MED 5-9CM (MISCELLANEOUS) ×3 IMPLANT
POSITIONER SURGICAL ARM (MISCELLANEOUS) ×3 IMPLANT
RELOAD PROXIMATE 75MM BLUE (ENDOMECHANICALS) ×3 IMPLANT
RTRCTR WOUND ALEXIS 18CM MED (MISCELLANEOUS)
SCISSORS LAP 5X35 DISP (ENDOMECHANICALS) ×3 IMPLANT
SEALER TISSUE G2 STRG ARTC 35C (ENDOMECHANICALS) ×3 IMPLANT
SET IRRIG TUBING LAPAROSCOPIC (IRRIGATION / IRRIGATOR) IMPLANT
SHEARS HARMONIC ACE PLUS 36CM (ENDOMECHANICALS) IMPLANT
SLEEVE ADV FIXATION 5X100MM (TROCAR) ×12 IMPLANT
SLEEVE ENDOPATH XCEL 5M (ENDOMECHANICALS) ×3 IMPLANT
STAPLER 90 3.5 STAND SLIM (STAPLE) ×3
STAPLER 90 3.5 STD SLIM (STAPLE) ×2 IMPLANT
STAPLER PROXIMATE 75MM BLUE (STAPLE) ×3 IMPLANT
STAPLER VISISTAT 35W (STAPLE) ×3 IMPLANT
SUT MNCRL AB 4-0 PS2 18 (SUTURE) ×3 IMPLANT
SUT PDS AB 1 CT1 27 (SUTURE) ×6 IMPLANT
SUT PDS AB 1 TP1 96 (SUTURE) ×6 IMPLANT
SUT PROLENE 2 0 CT2 30 (SUTURE) IMPLANT
SUT PROLENE 2 0 KS (SUTURE) IMPLANT
SUT SILK 2 0 (SUTURE) ×1
SUT SILK 2 0 SH CR/8 (SUTURE) ×3 IMPLANT
SUT SILK 2-0 18XBRD TIE 12 (SUTURE) ×2 IMPLANT
SUT SILK 3 0 (SUTURE) ×1
SUT SILK 3 0 SH CR/8 (SUTURE) ×3 IMPLANT
SUT SILK 3-0 18XBRD TIE 12 (SUTURE) ×2 IMPLANT
SYS LAPSCP GELPORT 120MM (MISCELLANEOUS)
SYSTEM LAPSCP GELPORT 120MM (MISCELLANEOUS) IMPLANT
TAPE CLOTH 4X10 WHT NS (GAUZE/BANDAGES/DRESSINGS) IMPLANT
TOWEL OR 17X26 10 PK STRL BLUE (TOWEL DISPOSABLE) ×3 IMPLANT
TRAY FOLEY BAG SILVER LF 14FR (CATHETERS) IMPLANT
TRAY FOLEY W/METER SILVER 16FR (SET/KITS/TRAYS/PACK) ×3 IMPLANT
TROCAR ADV FIXATION 5X100MM (TROCAR) ×3 IMPLANT
TROCAR BALLN 12MMX100 BLUNT (TROCAR) ×3 IMPLANT
TROCAR XCEL NON-BLD 11X100MML (ENDOMECHANICALS) IMPLANT
TUBING CONNECTING 10 (TUBING) ×6 IMPLANT
TUBING INSUF HEATED (TUBING) ×3 IMPLANT
YANKAUER SUCT BULB TIP NO VENT (SUCTIONS) ×3 IMPLANT

## 2017-06-05 NOTE — Anesthesia Postprocedure Evaluation (Signed)
Anesthesia Post Note  Patient: Bryan Floyd  Procedure(s) Performed: LAPAROSCOPIC ASSISTED SUBTOTAL COLECTOMY WITH ILEO DESCENDING ANASTOMOSIS AND TAKEDOWN OF SPLENIC FLEXURE (Right Abdomen)     Patient location during evaluation: PACU Anesthesia Type: Spinal Level of consciousness: awake Pain management: pain level controlled Respiratory status: spontaneous breathing Cardiovascular status: stable Anesthetic complications: no    Last Vitals:  Vitals:   06/05/17 0605  BP: (!) 146/89  Pulse: 84  Resp: 16  Temp: 37.2 C  SpO2: 98%    Last Pain:  Vitals:   06/05/17 0605  TempSrc: Oral  PainSc:                  [name]

## 2017-06-05 NOTE — Transfer of Care (Signed)
Immediate Anesthesia Transfer of Care Note  Patient: Bryan Floyd  Procedure(s) Performed: LAPAROSCOPIC ASSISTED SUBTOTAL COLECTOMY WITH ILEO DESCENDING ANASTOMOSIS AND TAKEDOWN OF SPLENIC FLEXURE (Right Abdomen)  Patient Location: PACU  Anesthesia Type:General  Level of Consciousness: drowsy and patient cooperative  Airway & Oxygen Therapy: Patient Spontanous Breathing and Patient connected to nasal cannula oxygen  Post-op Assessment: Report given to RN and Post -op Vital signs reviewed and stable  Post vital signs: Reviewed and stable  Last Vitals:  Vitals Value Taken Time  BP 178/91 06/05/2017 12:16 PM  Temp    Pulse 77 06/05/2017 12:19 PM  Resp 18 06/05/2017 12:19 PM  SpO2 100 % 06/05/2017 12:19 PM  Vitals shown include unvalidated device data.  Last Pain:  Vitals:   06/05/17 0605  TempSrc: Oral  PainSc:       Patients Stated Pain Goal: 4 (29/56/21 3086)  Complications: No apparent anesthesia complications

## 2017-06-05 NOTE — Op Note (Signed)
06/05/2017  12:15 PM  PATIENT:  Bryan Floyd  67 y.o. male  Patient Care Team: Harlan Stains, MD as PCP - General (Family Medicine) Festus Aloe, MD as Consulting Physician (Urology)  PRE-OPERATIVE DIAGNOSIS:  Endoscopically unremovable polyps  POST-OPERATIVE DIAGNOSIS:  Endoscopically unremovable polyps  PROCEDURE:   1. Laparoscopic subtotal colectomy with ileocolic anastomosis 2. Takedown of splenic flexure  SURGEON:  Sharon Mt. Dema Severin, MD  ASSISTANT: Michael Boston, MD - An MD assistant was necessary for complex exposure and retraction to facilitate safe protection of critical structures.  ANESTHESIA:   general  COUNTS:  Sponge, needle and instrument counts were reported correct x2 at the conclusion of the operation.  EBL: 100cc  DRAINS: None  SPECIMEN:  En bloc - terminal ileum, right colon, transverse colon and proximal descending colon  COMPLICATIONS: None  FINDINGS: Tattoos identified in the cecum/proximal ascending colon as well as the distal transverse colon just proximal to the splenic flexure. The tattoo of the distal transverse colon laid just proximal to the level of the left branch of the middle colic artery. The left colic artery was identified supplying the mid/distal descending and this was the distal point of transection, preserving the left colic artery. A stapled side-to-side, functional end-to-end anastomosis between distal ileum and mid/distal descending colon was fashioned. This was tension free, well perfused with palpable pulse in the left colic and marginal at this level and hemostatic. The small bowel mesentery was rotated counter-clockwise such that there were no twists and the small bowel laid in the right abdomen.  DISPOSITION: PACU in satisfactory condition  INDICATION: Bryan Floyd is a very pleasant 67 year old gentleman referred for evaluation of endoscopically unretrievable polyps. He underwent his first screening  colonoscopy approximately 1.5 years ago and was found to have multiple polyps in his colon with Dr. Watt Climes. Repeat colonoscopy 03/25/2016 demonstrated persistence of colon polyps. CScope 03/25/16:  1. 4 semi-sessile polyps are found the rectum, descending colon and mid transverse colon. The polyps are small in size. The polyps removed with a hot snare and resection/retrieval was complete. Pathology returned tubular adenomas; well-differentiated neuroendocrine tumor. All polyps were less than a centimeter in size. 2. Medium polyp found in the cecum, semi-sessile, removed piecemeal using hot snare and resection was noted to be complete. Pathology return tubular adenoma 3. Splenic flexure with tattoo had a large semi-sessile polyp located this area removed with piecemeal technique using hot snare. Resection was incomplete. Pathology returned tubular adenoma 4. Large polyp found in the mid ascending colon near the tattoo, semi-sessile, incompletely removed with piecemeal using hot snare. Pathology returned tubular adenoma  He subsequently was referred to [facility] for evaluation and attempted EMR/ESD - this occurred approximately 1 month ago findings: 1. Ascending colon-multiple fragments of adenomatous polyp with focal high-grade dysplasia and focal suspicion of early stromal invasion/intramucosal adenocarcinoma 2. Colon "base" of polypectomy and biopsy-adenomatous polyp 3. Cecum biopsy-adenomatous polyp no high-grade dysplasia 4. Sigmoid biopsy-adenomatous polyp with high-grade dysplasia  CT C/A/P 01/2017 - no evidence of metastatic disease.  I repeated his colonoscopy 04/10/17. The two endoscopically unresectable polyps were identified and estimated to be in the proximal ascending colon and at the splenic flexure. Additional tattoo was injected. Additional polyps were identified and removed - one 91mm in the cecum and anolther in the rectosigmoid. Path returned cecal tubular adenoma and a HP  in the retosigmoid.  He reports no interval changes in his health since being seen in clinic 03/2017. He denies any new medications. He denies taking any  blood thinners.  Will plan laparoscopic vs open extended right hemicolectomy vssubtotal/total abdominal colectomy withileosigmoid vs ileorectalanastomosis. We discussed that much of the final surgical plan would be based upon intraoperative findings and where the tattoos are actually located.  The anatomy and physiology of the GI tract was discussed at length with the patient and his wife. The pathophysiology of GI was discussed at length with associated pictures and illustrations.  The planned procedure, material risks (including, but not limited to, pain, bleeding, infection, scarring, need for blood transfusion, damage to surrounding structures- blood vessels/nerves/viscus/organs, damage to ureter, urine leak, leak from anastomosis, need for additional procedures, need for stoma which may be permanent, hernia, recurrence, pneumonia, blood clot, pulmonary embolism, heart attack, stroke, death) benefits and alternatives to surgery were discussed at length. The patient and his wife's questions were answered to their satisfaction, they voiced understandingand they elected to proceed with surgery.  DESCRIPTION: The patient was identified in preop holding and taken to the OR where he was placed on the operating room table and SCDs were placed. General endotracheal anesthesia was induced without difficulty.  The patient was then positioned in lithotomy, all pressure points were padded and rechecked. A foley catheter was placed. The hair on the abdomen was clipped by the nursing team. The patient was then prepped and draped in the usual sterile fashion. A surgical timeout was performed indicating the correct patient, procedure, positioning and need for preoperative antibiotics.  A supraumbilical incision was made and carried down through the  subcutaneous tissue to the level of the umbilical stalk.  The umbilical stalk was grasped with a Kocher clamp and retracted outwardly.  The supraumbilical fascia was exposed.  This was then incised sharply.  The peritoneal cavity was gently entered bluntly.  A 12 mm Hassan cannula was then placed.  The abdomen was then insufflated with CO2 to a maximum pressure of 15 mmHg.  A laparoscope was then inserted into the peritoneal cavity and inspection revealed no evidence of trocar site complications or injury.  Under laparoscopic guidance, bilateral TAP blocks were performed using a mixture of Exparel and Marcaine.  Four additional 5 mm ports were then placed to in the right abdomen into the left abdomen. The abdomen was surveyed and the tattoos were identified - one in the cecum/ascending colon and the other at the distal transverse colon. The descending colon, sigmoid and rectum were examined and other tattoo was noted. The patient was then positioned with head down and right side up and the omentum was swept up over the liver.  The small bowel was extracted from the pelvis and swept into the upper abdomen.  The terminal ileum was identified and retracted medially.  Retroperitoneal attachments to the terminal ileum were gently dissected sharply taking care to preserve the underlying retroperitoneal structures.  The appendix and cecum were then identified as was the tattoo in the ascending colon.  The cecum was grasped and retracted medially.  The Adaleah Forget line of Toldt along the ascending colon was then divided sharply and the colon medialized.  The hepatic flexure was approached and working medially the colon was mobilized taking care to preserve the underlying duodenum.  The omentum overlying the proximal transverse colon was then grasped and elevated anteriorly and taken off of the proximal transverse colon.  Again coming around the hepatic flexure, the duodenum was identified and preserved free of injury.  At this  point attention was turned to ligation of the ileocolic pedicle.  The cecum was grasped  and elevated anteriorly.  The pedicle was readily identified.  The mesentery was scored in the peritoneum at this location was opened.  The ileocolic pedicle was circumferentially dissected and the duodenum was clearly identified posterior to our plane of dissection.  After the vessel was circumferentially dissected and the duodenum confirmed down, the ileocolic pedicle was ligated using the laparoscopic Enseal device.  The patient was then repositioned left-right even and taken out of steep Trendelenburg.  The transverse colon was grasped and retracted caudad and the omentum retracted anteriorly.  The omentum was taken off the colon at this level and the lesser sac was again reentered.  This was performed all the way over to the level of the splenic flexure.  The omentum at the splenic flexure was taken off of the associated colon.  The tattoo on the distal transverse colon was clearly visualized at this level.  After the lesser sac had been fully dissected and the omentum separated from the colon, the transverse colon was elevated anteriorly and the middle colic pedicle identified near the inferior border the pancreas.  The branches of this vessel were visualized and the left branch went to the colon just distal to the tattooed area.  Given this finding, the decision was made to remove the associated colon at this level as well as the proximal descending colon to the level of the left colic artery.  After the omentum had been reflected into the upper abdomen, the transverse colon was retracted again caudad and anteriorly in the mesentery was clearly visualized.  The mesentery of the remaining ascending colon and transverse colon was then ligated using a laparoscopic Enseal device.  This division occurred all the way over towards the level of the splenic flexure.  At this point, attention was turned to mobilizing the left colon.   The patient was repositioned in Trendelenburg and the sigmoid colon was again visualized.  The sigmoid colon was retracted medially and the attachments to the left abdominal wall and retroperitoneum were gently taken down sharply.  The plane was developed anterior to the left ureter and gonadal vessels.  The descending colon was then mobilized along the Ayriel Texidor line of Toldt up to the splenic flexure meeting the plane of prior mobilization from the transverse colon.  The descending colon reached the mid abdomen without any tension or difficulty.  The patient was again was taken out of Trendelenburg and the remaining mesentery of the transverse colon as well as splenic flexure was divided using the Enseal device.  The proximal descending colon mesentery was also ligated with the Enseal.  The left colic artery was visualized through the mesentery and following a branch of it out to the mid descending colon the mesentery was ligated to this level as this was the planned point of distal resection.  At this point, attention was turned to the extracorporeal portion of the procedure.   The 12 mm trochar was removed and the umbilical incision was extended cephalad and caudad using electrocautery.  An Alexis wound protector and sterile blue towels were then placed.  The terminal ileum was delivered to the wound and the proximal point of transection was identified.  This was approximately 10 cm proximal to the ileocecal valve.  The mesentery at this level was cleared and the terminal ileum divided using a GIA blue load.  The descending colon and transverse colon were then eviscerated from the abdominal cavity and the distal point of transection along the mid descending colon was identified.  The remaining mesentery to this level was cleared and ligated using the Enseal device.  The mid descending colon was then divided using a GIA blue load stapler.  The specimen was then passed off.  Attention was then turned to performing  the ileal to descending colon anastomosis.  Prior to doing this in order to ensure proper lay of the bowel and associated mesentery, an Alexis wound protector was placed and the abdomen reinsufflated with CO2.  The terminal ileum and descending colon were identified.  The small bowel mesentery was rotated 180 degrees counterclockwise such that the cut edge of the small bowel mesentery faced to the patients left and abutted to descending colon. The mesentery was inspected and confirmed to not be twisted, having a nice lay in the abdomen.  At this point, the Osseo wound protector was removed and the abdomen desufflated.  The terminal ileum and descending colon were then removed through the wound protector and prepared for anastomosis. The descending colon and distal ileum were reinspected and noted to be pink.  There was a pulse palpable in the mesentery at the level of transection of both the ileum and descending colon. A 3-0 silk stay suture was placed. An enterotomy and colotomy were then made and a side-to-side, functional end-to-end ileo-colic anastomosis was fashioned with a 38mm GIA blue load. The staple line was inspected and noted to be hemostatic. The common enterotomy was then closed using a TA 90 blue load. 2 oozing spots on the staple line were oversewn with u-stitches of 3-0 silk. The staple line was then noted to be hemostatic. The corners of the staple line were then "dunked" with 2-0 silk suture. 2 anti-tension "crotch" sutures were then placed at the end of the anastomosis.  The Hope wound protector was then replaced and the abdomen reinsufflated.  The entire peritoneal cavity was visualized and all cut edges of mesentery noted to be hemostatic.  The omentum was then pulled back down from the upper quadrants of the abdomen and placed over the anastomosis and into the pelvis. The abdomen was desufflated and the Conway wound protector removed. The laparoscopic ports were removed and noted to  be hemostatic.  Gowns, gloves and instruments were exchanged for new sterile equipment and attention turned to closure. The midline fascia was approximated using running #1 PDS suture. The wounds were then irrigated with sterile saline. The skin of all incision sites was closed with 4-0 monocryl subcuticular suture and the midline wound dressed with a Honeycomb dressing.  The patient was then awakened from general anesthesia, taken out of the lithotomy position, extubated and transferred to a stretcher for transport to PACU in satisfactory condition.  Note: This dictation was prepared with Dragon/digital dictation along with [employer]. Any transcriptional errors that result from this process are unintentional.

## 2017-06-05 NOTE — H&P (Signed)
CC: Here today for surgery.  HPI: Bryan Floyd is a very pleasant 67 year old gentleman referred for evaluation of endoscopically unretrievable polyps. He underwent his first screening colonoscopy approximately 1.5 years ago and was found to have multiple polyps in his colon with Dr. Watt Climes. Repeat colonoscopy 03/25/2016 demonstrated persistence of colon polyps. CScope 03/25/16:  1. 4 semi-sessile polyps are found the rectum, descending colon and mid transverse colon. The polyps are small in size. The polyps removed with a hot snare and resection/retrieval was complete. Pathology returned tubular adenomas; well-differentiated neuroendocrine tumor. All polyps were less than a centimeter in size. 2. Medium polyp found in the cecum, semi-sessile, removed piecemeal using hot snare and resection was noted to be complete. Pathology return tubular adenoma 3. Splenic flexure with tattoo had a large semi-sessile polyp located this area removed with piecemeal technique using hot snare. Resection was incomplete. Pathology returned tubular adenoma 4. Large polyp found in the mid ascending colon near the tattoo, semi-sessile, incompletely removed with piecemeal using hot snare. Pathology returned tubular adenoma  He subsequently was referred to [facility] for evaluation and attempted EMR/ESD - this occurred approximately 1 month ago findings: 1. Ascending colon-multiple fragments of adenomatous polyp with focal high-grade dysplasia and focal suspicion of early stromal invasion/intramucosal adenocarcinoma 2. Colon "base" of polypectomy and biopsy-adenomatous polyp 3. Cecum biopsy-adenomatous polyp no high-grade dysplasia 4. Sigmoid biopsy-adenomatous polyp with high-grade dysplasia  CT C/A/P 01/2017 - no evidence of metastatic disease.  I repeated his colonoscopy 04/10/17. The two endoscopically unresectable polyps were identified and estimated to be in the proximal ascending colon and at the splenic  flexure. Additional tattoo was injected. Additional polyps were identified and removed - one 60mm in the cecum and anolther in the rectosigmoid. Path returned cecal tubular adenoma and a HP in the retosigmoid.  He reports no interval changes in his health since being seen in clinic 03/2017. He denies any new medications. He denies taking any blood thinners.  PMH: HTN (controlled on oral antihypertensives); BPH PSHx: Denies FHx: Denies FHx of malignancy Social: Denies use of tobacco/EtOH/drugs ROS: A comprehensive 10 system ROS was completed with the patient. Pertinent findings as noted above.  Past Medical History:  Diagnosis Date  . Glaucoma   . Hypertension     Past Surgical History:  Procedure Laterality Date  . COLONOSCOPY WITH PROPOFOL N/A 03/25/2016   Procedure: COLONOSCOPY WITH PROPOFOL;  Surgeon: Clarene Essex, MD;  Location: [facility];  Service: Endoscopy;  Laterality: N/A;  . COLONOSCOPY WITH PROPOFOL N/A 04/10/2017   Procedure: COLONOSCOPY WITH PROPOFOL;  Surgeon: Ileana Roup, MD;  Location: [facility];  Service: General;  Laterality: N/A;  . CYSTOSCOPY N/A 07/24/2013   Procedure: CYSTOSCOPY;  Surgeon: Festus Aloe, MD;  Location: [facility];  Service: Urology;  Laterality: N/A;  . EYE SURGERY     laser  . HOT HEMOSTASIS N/A 03/25/2016   Procedure: HOT HEMOSTASIS (ARGON PLASMA COAGULATION/BICAP);  Surgeon: Clarene Essex, MD;  Location: [facility];  Service: Endoscopy;  Laterality: N/A;  . TRANSURETHRAL RESECTION OF PROSTATE N/A 07/24/2013   Procedure: TRANSURETHRAL RESECTION OF THE PROSTATE (TURP), staged;  Surgeon: Festus Aloe, MD;  Location: [facility];  Service: Urology;  Laterality: N/A;    Family History  Problem Relation Age of Onset  . Stroke Father   . Heart disease Father   . Cancer Father     Social:  reports that he has never smoked. He has never used smokeless tobacco. He reports that he drinks about 0.6 oz  of alcohol per week. He reports that he  does not use drugs.  Allergies: No Known Allergies  Medications: I have reviewed the patient's current medications.  No results found for this or any previous visit (from the past 48 hour(s)).  No results found.  ROS - all of the below systems have been reviewed with the patient and positives are indicated with bold text General: chills, fever or night sweats Eyes: blurry vision or double vision ENT: epistaxis or sore throat Allergy/Immunology: itchy/watery eyes or nasal congestion Hematologic/Lymphatic: bleeding problems, blood clots or swollen lymph nodes Endocrine: temperature intolerance or unexpected weight changes Breast: new or changing breast lumps or nipple discharge Resp: cough, shortness of breath, or wheezing CV: chest pain or dyspnea on exertion GI: as per HPI GU: dysuria, trouble voiding, or hematuria MSK: joint pain or joint stiffness Neuro: TIA or stroke symptoms Derm: pruritus and skin lesion changes Psych: anxiety and depression  PE Blood pressure (!) 146/89, pulse 84, temperature 99 F (37.2 C), temperature source Oral, resp. rate 16, height 5\' 10"  (1.778 m), weight 117.5 kg (259 lb), SpO2 98 %. Constitutional: NAD; conversant; no deformities Eyes: Moist conjunctiva; no lid lag; anicteric; PERRL Neck: Trachea midline; no thyromegaly Lungs: Normal respiratory effort; no tactile fremitus CV: RRR; no palpable thrills; no pitting edema GI: Abd soft, NT/ND; no palpable hepatosplenomegaly MSK: Normal gait; no clubbing/cyanosis Psychiatric: Appropriate affect; alert and oriented x3 Lymphatic: No palpable cervical or axillary lymphadenopathy  A/P: Bryan Floyd is a very pleasant 67yo gentleman with multiple endoscopically unresectable colon polyps - estimated to be at prox ascending and splenic flexure.  -Cardiac clearance obtained and in chart -Will plan laparoscopic vs open extended right hemicolectomy vs subtotal/total abdominal colectomy with ileosigmoid vs  ileorectal anastomosis. We discussed that much of the final surgical plan would be based upon intraoperative findings and where the tattoos are actually located. -The anatomy and physiology of the GI tract was discussed at length with the patient and his wife. The pathophysiology of GI was discussed at length with associated pictures and illustrations. -The planned procedure, material risks (including, but not limited to, pain, bleeding, infection, scarring, need for blood transfusion, damage to surrounding structures- blood vessels/nerves/viscus/organs, damage to ureter, urine leak, leak from anastomosis, need for additional procedures, need for stoma which may be permanent, hernia, recurrence, pneumonia, blood clot, pulmonary embolism, heart attack, stroke, death) benefits and alternatives to surgery were discussed at length. The patient and his wife's questions were answered to their satisfaction, they voiced understanding and they elected to proceed with surgery.  Sharon Mt. Dema Severin, M.D. General and Colorectal Surgery [facility]

## 2017-06-05 NOTE — H&P (Deleted)
06/05/2017  12:15 PM  PATIENT:  Bryan Floyd  67 y.o. male  Patient Care Team: Harlan Stains, MD as PCP - General (Family Medicine) Festus Aloe, MD as Consulting Physician (Urology)  PRE-OPERATIVE DIAGNOSIS:  Endoscopically unremovable polyps  POST-OPERATIVE DIAGNOSIS:  Endoscopically unremovable polyps  PROCEDURE:   1. Laparoscopic subtotal colectomy with ileocolic anastomosis 2. Takedown of splenic flexure  SURGEON:  Sharon Mt. Dema Severin, MD  ASSISTANT: Michael Boston, MD - An MD assistant was necessary for complex exposure and retraction to facilitate safe protection of critical structures.  ANESTHESIA:   general  COUNTS:  Sponge, needle and instrument counts were reported correct x2 at the conclusion of the operation.  EBL: 100cc  DRAINS: None  SPECIMEN:  En bloc - terminal ileum, right colon, transverse colon and proximal descending colon  COMPLICATIONS: None  FINDINGS: Tattoos identified in the cecum/proximal ascending colon as well as the distal transverse colon just proximal to the splenic flexure. The tattoo of the distal transverse colon laid just proximal to the level of the left branch of the middle colic artery. The left colic artery was identified supplying the mid/distal descending and this was the distal point of transection, preserving the left colic artery. A stapled side-to-side, functional end-to-end anastomosis between distal ileum and mid/distal descending colon was fashioned. This was tension free, well perfused with palpable pulse in the left colic and marginal at this level and hemostatic. The small bowel mesentery was rotated counter-clockwise such that there were no twists and the small bowel laid in the right abdomen.  DISPOSITION: PACU in satisfactory condition  INDICATION: [name] is a very pleasant 67 year old gentleman referred for evaluation of endoscopically unretrievable polyps. He underwent his first screening colonoscopy  approximately 1.5 years ago and was found to have multiple polyps in his colon with Dr. Watt Climes. Repeat colonoscopy 03/25/2016 demonstrated persistence of colon polyps. CScope 03/25/16:  1. 4 semi-sessile polyps are found the rectum, descending colon and mid transverse colon. The polyps are small in size. The polyps removed with a hot snare and resection/retrieval was complete. Pathology returned tubular adenomas; well-differentiated neuroendocrine tumor. All polyps were less than a centimeter in size. 2. Medium polyp found in the cecum, semi-sessile, removed piecemeal using hot snare and resection was noted to be complete. Pathology return tubular adenoma 3. Splenic flexure with tattoo had a large semi-sessile polyp located this area removed with piecemeal technique using hot snare. Resection was incomplete. Pathology returned tubular adenoma 4. Large polyp found in the mid ascending colon near the tattoo, semi-sessile, incompletely removed with piecemeal using hot snare. Pathology returned tubular adenoma  He subsequently was referred to [facility] for evaluation and attempted EMR/ESD - this occurred approximately 1 month ago findings: 1. Ascending colon-multiple fragments of adenomatous polyp with focal high-grade dysplasia and focal suspicion of early stromal invasion/intramucosal adenocarcinoma 2. Colon "base" of polypectomy and biopsy-adenomatous polyp 3. Cecum biopsy-adenomatous polyp no high-grade dysplasia 4. Sigmoid biopsy-adenomatous polyp with high-grade dysplasia  CT C/A/P 01/2017 - no evidence of metastatic disease.  I repeated his colonoscopy 04/10/17. The two endoscopically unresectable polyps were identified and estimated to be in the proximal ascending colon and at the splenic flexure. Additional tattoo was injected. Additional polyps were identified and removed - one 74mm in the cecum and anolther in the rectosigmoid. Path returned cecal tubular adenoma and a HP in the  retosigmoid.  He reports no interval changes in his health since being seen in clinic 03/2017. He denies any new medications. He denies taking any  blood thinners.  Will plan laparoscopic vs open extended right hemicolectomy vs subtotal/total abdominal colectomy with ileosigmoid vs ileorectal anastomosis. We discussed that much of the final surgical plan would be based upon intraoperative findings and where the tattoos are actually located.  The anatomy and physiology of the GI tract was discussed at length with the patient and his wife. The pathophysiology of GI was discussed at length with associated pictures and illustrations.  The planned procedure, material risks (including, but not limited to, pain, bleeding, infection, scarring, need for blood transfusion, damage to surrounding structures- blood vessels/nerves/viscus/organs, damage to ureter, urine leak, leak from anastomosis, need for additional procedures, need for stoma which may be permanent, hernia, recurrence, pneumonia, blood clot, pulmonary embolism, heart attack, stroke, death) benefits and alternatives to surgery were discussed at length. The patient and his wife's questions were answered to their satisfaction, they voiced understanding and they elected to proceed with surgery.  DESCRIPTION: The patient was identified in preop holding and taken to the OR where he was placed on the operating room table and SCDs were placed. General endotracheal anesthesia was induced without difficulty.  The patient was then positioned in lithotomy, all pressure points were padded and rechecked. A foley catheter was placed. The hair on the abdomen was clipped by the nursing team. The patient was then prepped and draped in the usual sterile fashion. A surgical timeout was performed indicating the correct patient, procedure, positioning and need for preoperative antibiotics.  A supraumbilical incision was made and carried down through the subcutaneous tissue  to the level of the umbilical stalk.  The umbilical stalk was grasped with a Kocher clamp and retracted outwardly.  The supraumbilical fascia was exposed.  This was then incised sharply.  The peritoneal cavity was gently entered bluntly.  A 12 mm Hassan cannula was then placed.  The abdomen was then insufflated with CO2 to a maximum pressure of 15 mmHg.  A laparoscope was then inserted into the peritoneal cavity and inspection revealed no evidence of trocar site complications or injury.  Under laparoscopic guidance, bilateral TAP blocks were performed using a mixture of Exparel and Marcaine.  Four additional 5 mm ports were then placed to in the right abdomen into the left abdomen. The abdomen was surveyed and the tattoos were identified - one in the cecum/ascending colon and the other at the distal transverse colon. The descending colon, sigmoid and rectum were examined and other tattoo was noted. The patient was then positioned with head down and right side up and the omentum was swept up over the liver.  The small bowel was extracted from the pelvis and swept into the upper abdomen.  The terminal ileum was identified and retracted medially.  Retroperitoneal attachments to the terminal ileum were gently dissected sharply taking care to preserve the underlying retroperitoneal structures.  The appendix and cecum were then identified as was the tattoo in the ascending colon.  The cecum was grasped and retracted medially.  The Zoria Rawlinson line of Toldt along the ascending colon was then divided sharply and the colon medialized.  The hepatic flexure was approached and working medially the colon was mobilized taking care to preserve the underlying duodenum.  The omentum overlying the proximal transverse colon was then grasped and elevated anteriorly and taken off of the proximal transverse colon.  Again coming around the hepatic flexure, the duodenum was identified and preserved free of injury.  At this point attention was  turned to ligation of the ileocolic pedicle.  The cecum was grasped and elevated anteriorly.  The pedicle was readily identified.  The mesentery was scored in the peritoneum at this location was opened.  The ileocolic pedicle was circumferentially dissected and the duodenum was clearly identified posterior to our plane of dissection.  After the vessel was circumferentially dissected and the duodenum confirmed down, the ileocolic pedicle was ligated using the laparoscopic Enseal device.  The patient was then repositioned left-right even and taken out of steep Trendelenburg.  The transverse colon was grasped and retracted caudad and the omentum retracted anteriorly.  The omentum was taken off the colon at this level and the lesser sac was again reentered.  This was performed all the way over to the level of the splenic flexure.  The omentum at the splenic flexure was taken off of the associated colon.  The tattoo on the distal transverse colon was clearly visualized at this level.  After the lesser sac had been fully dissected and the omentum separated from the colon, the transverse colon was elevated anteriorly and the middle colic pedicle identified near the inferior border the pancreas.  The branches of this vessel were visualized and the left branch went to the colon just distal to the tattooed area.  Given this finding, the decision was made to remove the associated colon at this level as well as the proximal descending colon to the level of the left colic artery.  After the omentum had been reflected into the upper abdomen, the transverse colon was retracted again caudad and anteriorly in the mesentery was clearly visualized.  The mesentery of the remaining ascending colon and transverse colon was then ligated using a laparoscopic Enseal device.  This division occurred all the way over towards the level of the splenic flexure.  At this point, attention was turned to mobilizing the left colon.  The patient was  repositioned in Trendelenburg and the sigmoid colon was again visualized.  The sigmoid colon was retracted medially and the attachments to the left abdominal wall and retroperitoneum were gently taken down sharply.  The plane was developed anterior to the left ureter and gonadal vessels.  The descending colon was then mobilized along the Shallen Luedke line of Toldt up to the splenic flexure meeting the plane of prior mobilization from the transverse colon.  The descending colon reached the mid abdomen without any tension or difficulty.  The patient was again was taken out of Trendelenburg and the remaining mesentery of the transverse colon as well as splenic flexure was divided using the Enseal device.  The proximal descending colon mesentery was also ligated with the Enseal.  The left colic artery was visualized through the mesentery and following a branch of it out to the mid descending colon the mesentery was ligated to this level as this was the planned point of distal resection.  At this point, attention was turned to the extracorporeal portion of the procedure.   The 12 mm trochar was removed and the umbilical incision was extended cephalad and caudad using electrocautery.  An Alexis wound protector and sterile blue towels were then placed.  The terminal ileum was delivered to the wound and the proximal point of transection was identified.  This was approximately 10 cm proximal to the ileocecal valve.  The mesentery at this level was cleared and the terminal ileum divided using a GIA blue load.  The descending colon and transverse colon were then eviscerated from the abdominal cavity and the distal point of transection along the mid descending  colon was identified.  The remaining mesentery to this level was cleared and ligated using the Enseal device.  The mid descending colon was then divided using a GIA blue load stapler.  The specimen was then passed off.  Attention was then turned to performing the ileal to  descending colon anastomosis.  Prior to doing this in order to ensure proper lay of the bowel and associated mesentery, an Alexis wound protector was placed and the abdomen reinsufflated with CO2.  The terminal ileum and descending colon were identified.  The small bowel mesentery was rotated 180 degrees counterclockwise such that the cut edge of the small bowel mesentery faced to the patients left and abutted to descending colon. The mesentery was inspected and confirmed to not be twisted, having a nice lay in the abdomen.  At this point, the [place] wound protector was removed and the abdomen desufflated.  The terminal ileum and descending colon were then removed through the wound protector and prepared for anastomosis. The descending colon and distal ileum were reinspected and noted to be pink.  There was a pulse palpable in the mesentery at the level of transection of both the ileum and descending colon. A 3-0 silk stay suture was placed. An enterotomy and colotomy were then made and a side-to-side, functional end-to-end ileo-colic anastomosis was fashioned with a 46mm GIA blue load. The staple line was inspected and noted to be hemostatic. The common enterotomy was then closed using a TA 90 blue load. 2 oozing spots on the staple line were oversewn with u-stitches of 3-0 silk. The staple line was then noted to be hemostatic. The corners of the staple line were then "dunked" with 2-0 silk suture. 2 anti-tension "crotch" sutures were then placed at the end of the anastomosis.  The Okay wound protector was then replaced and the abdomen reinsufflated.  The entire peritoneal cavity was visualized and all cut edges of mesentery noted to be hemostatic.  The omentum was then pulled back down from the upper quadrants of the abdomen and placed over the anastomosis and into the pelvis. The abdomen was desufflated and the Antimony wound protector removed. The laparoscopic ports were removed and noted to be  hemostatic.  Gowns, gloves and instruments were exchanged for new sterile equipment and attention turned to closure. The midline fascia was approximated using running #1 PDS suture. The wounds were then irrigated with sterile saline. The skin of all incision sites was closed with 4-0 monocryl subcuticular suture and the midline wound dressed with a Honeycomb dressing.  The patient was then awakened from general anesthesia, taken out of the lithotomy position, extubated and transferred to a stretcher for transport to PACU in satisfactory condition.  Note: This dictation was prepared with Dragon/digital dictation along with [employer]. Any transcriptional errors that result from this process are unintentional.

## 2017-06-05 NOTE — Anesthesia Procedure Notes (Signed)
Procedure Name: Intubation Date/Time: 06/05/2017 7:40 AM Performed by: Dione Booze, CRNA Pre-anesthesia Checklist: Suction available, Patient being monitored, Emergency Drugs available and Patient identified Patient Re-evaluated:Patient Re-evaluated prior to induction Oxygen Delivery Method: Circle system utilized Preoxygenation: Pre-oxygenation with 100% oxygen Induction Type: IV induction Ventilation: Mask ventilation without difficulty Laryngoscope Size: Mac and 4 Grade View: Grade I Tube type: Oral Tube size: 7.5 mm Number of attempts: 1 Airway Equipment and Method: Stylet Placement Confirmation: ETT inserted through vocal cords under direct vision,  positive ETCO2 and breath sounds checked- equal and bilateral Secured at: 23 cm Tube secured with: Tape Dental Injury: Teeth and Oropharynx as per pre-operative assessment

## 2017-06-05 NOTE — Anesthesia Preprocedure Evaluation (Addendum)
Anesthesia Evaluation  Patient identified by MRN, date of birth, ID band Patient awake    Reviewed: Allergy & Precautions, NPO status , Patient's Chart, lab work & pertinent test results  History of Anesthesia Complications (+) Emergence Delirium  Airway Mallampati: II  TM Distance: >3 FB     Dental   Pulmonary    breath sounds clear to auscultation       Cardiovascular hypertension,  Rhythm:Regular Rate:Normal     Neuro/Psych    GI/Hepatic negative GI ROS, Neg liver ROS,   Endo/Other  negative endocrine ROS  Renal/GU negative Renal ROS     Musculoskeletal   Abdominal   Peds  Hematology   Anesthesia Other Findings   Reproductive/Obstetrics                            Anesthesia Physical Anesthesia Plan  ASA: III  Anesthesia Plan: General   Post-op Pain Management:    Induction: Intravenous  PONV Risk Score and Plan: 3 and Treatment may vary due to age or medical condition, Ondansetron, Dexamethasone and Midazolam  Airway Management Planned: Simple Face Mask and Nasal Cannula  Additional Equipment:   Intra-op Plan:   Post-operative Plan: Possible Post-op intubation/ventilation  Informed Consent: I have reviewed the patients History and Physical, chart, labs and discussed the procedure including the risks, benefits and alternatives for the proposed anesthesia with the patient or authorized representative who has indicated his/her understanding and acceptance.   Dental advisory given  Plan Discussed with: CRNA and Anesthesiologist  Anesthesia Plan Comments:        Anesthesia Quick Evaluation

## 2017-06-06 ENCOUNTER — Other Ambulatory Visit: Payer: Self-pay

## 2017-06-06 ENCOUNTER — Encounter (HOSPITAL_COMMUNITY): Payer: Self-pay

## 2017-06-06 LAB — MAGNESIUM: Magnesium: 1.6 mg/dL — ABNORMAL LOW (ref 1.7–2.4)

## 2017-06-06 LAB — BASIC METABOLIC PANEL
Anion gap: 10 (ref 5–15)
BUN: 13 mg/dL (ref 6–20)
CO2: 24 mmol/L (ref 22–32)
Calcium: 9 mg/dL (ref 8.9–10.3)
Chloride: 104 mmol/L (ref 101–111)
Creatinine, Ser: 1.09 mg/dL (ref 0.61–1.24)
GFR calc Af Amer: >60 mL/min (ref >60)
GFR calc non Af Amer: >60 mL/min (ref >60)
Glucose, Bld: 143 mg/dL — ABNORMAL HIGH (ref 65–99)
Potassium: 4.1 mmol/L (ref 3.5–5.1)
Sodium: 138 mmol/L (ref 135–145)

## 2017-06-06 LAB — CBC
HCT: 35.4 % — ABNORMAL LOW (ref 39.0–52.0)
Hemoglobin: 12.1 g/dL — ABNORMAL LOW (ref 13.0–17.0)
MCH: 30.4 pg (ref 26.0–34.0)
MCHC: 34.2 g/dL (ref 30.0–36.0)
MCV: 88.9 fL (ref 78.0–100.0)
Platelets: 308 10*3/uL (ref 150–400)
RBC: 3.98 MIL/uL — ABNORMAL LOW (ref 4.22–5.81)
RDW: 15.6 % — ABNORMAL HIGH (ref 11.5–15.5)
WBC: 6.3 10*3/uL (ref 4.0–10.5)

## 2017-06-06 LAB — PHOSPHORUS: Phosphorus: 3.7 mg/dL (ref 2.5–4.6)

## 2017-06-06 NOTE — Progress Notes (Signed)
Patient ID: Bryan Floyd, male   DOB: 1950/06/03, 67 y.o.   MRN: [id number] [facility] Surgery Progress Note:   1 Day Post-Op  Subjective: Mental status is clear; alert.  Sitting up in chair.   Objective: Vital signs in last 24 hours: Temp:  [97.7 F (36.5 C)-99.7 F (37.6 C)] 98.3 F (36.8 C) (04/13 0532) Pulse Rate:  [76-104] 82 (04/13 0532) Resp:  [15-26] 18 (04/13 0532) BP: (151-190)/(83-102) 174/93 (04/13 0532) SpO2:  [100 %] 100 % (04/13 0532) Weight:  [120.8 kg (266 lb 5.1 oz)] 120.8 kg (266 lb 5.1 oz) (04/13 0619)  Intake/Output from previous day: 04/12 0701 - 04/13 0700 In: 2651.7 [P.O.:60; I.V.:2541.7; IV Piggyback:50] Out: 6269 [Urine:2250; Blood:200] Intake/Output this shift: No intake/output data recorded.  Physical Exam: Work of breathing is not labored;  Main incision is covered.  No flatus   Lab Results:  Results for orders placed or performed during the hospital encounter of 06/05/17 (from the past 48 hour(s))  CBC     Status: Abnormal   Collection Time: 06/05/17  3:05 PM  Result Value Ref Range   WBC 10.6 (H) 4.0 - 10.5 K/uL   RBC 4.68 4.22 - 5.81 MIL/uL   Hemoglobin 14.3 13.0 - 17.0 g/dL   HCT 41.4 39.0 - 52.0 %   MCV 88.5 78.0 - 100.0 fL   MCH 30.6 26.0 - 34.0 pg   MCHC 34.5 30.0 - 36.0 g/dL   RDW 15.6 (H) 11.5 - 15.5 %   Platelets 319 150 - 400 K/uL    Comment: Performed at [facility], [place] [address] [place], [place] [address]  Creatinine, serum     Status: Abnormal   Collection Time: 06/05/17  3:05 PM  Result Value Ref Range   Creatinine, Ser 1.27 (H) 0.61 - 1.24 mg/dL   GFR calc non Af Amer 57 (L) >60 mL/min   GFR calc Af Amer >60 >60 mL/min    Comment: (NOTE) The eGFR has been calculated using the CKD EPI equation. This calculation has not been validated in all clinical situations. eGFR's persistently <60 mL/min signify possible Chronic Kidney Disease. Performed at [facility], [place]  [address], [place], [place] [address]   CBC     Status: Abnormal   Collection Time: 06/06/17  4:04 AM  Result Value Ref Range   WBC 6.3 4.0 - 10.5 K/uL   RBC 3.98 (L) 4.22 - 5.81 MIL/uL   Hemoglobin 12.1 (L) 13.0 - 17.0 g/dL   HCT 35.4 (L) 39.0 - 52.0 %   MCV 88.9 78.0 - 100.0 fL   MCH 30.4 26.0 - 34.0 pg   MCHC 34.2 30.0 - 36.0 g/dL   RDW 15.6 (H) 11.5 - 15.5 %   Platelets 308 150 - 400 K/uL    Comment: Performed at [facility], [place] [address], [place], [place] [address]  Basic metabolic panel     Status: Abnormal   Collection Time: 06/06/17  4:04 AM  Result Value Ref Range   Sodium 138 135 - 145 mmol/L   Potassium 4.1 3.5 - 5.1 mmol/L   Chloride 104 101 - 111 mmol/L   CO2 24 22 - 32 mmol/L   Glucose, Bld 143 (H) 65 - 99 mg/dL   BUN 13 6 - 20 mg/dL   Creatinine, Ser 1.09 0.61 - 1.24 mg/dL   Calcium 9.0 8.9 - 10.3 mg/dL   GFR calc non Af Amer >60 >60 mL/min   GFR calc  Af Amer >60 >60 mL/min    Comment: (NOTE) The eGFR has been calculated using the CKD EPI equation. This calculation has not been validated in all clinical situations. eGFR's persistently <60 mL/min signify possible Chronic Kidney Disease.    Anion gap 10 5 - 15    Comment: Performed at [facility], [place] [address], [place], [place] [address]  Magnesium     Status: Abnormal   Collection Time: 06/06/17  4:04 AM  Result Value Ref Range   Magnesium 1.6 (L) 1.7 - 2.4 mg/dL    Comment: Performed at [facility], [place] [address], [place], [place] [address]  Phosphorus     Status: None   Collection Time: 06/06/17  4:04 AM  Result Value Ref Range   Phosphorus 3.7 2.5 - 4.6 mg/dL    Comment: Performed at [facility], [place] [address], [place], [place] [address]    Radiology/Results: No results found.  Anti-infectives: Anti-infectives (From admission, onward)   Start     Dose/Rate Route Frequency Ordered Stop   06/05/17 0545  cefoTEtan  in Dextrose 5% (CEFOTAN) 2-2.08 GM-%(50ML) IVPB    Note to Pharmacy:  Waldron Session   : cabinet override      06/05/17 0545 06/05/17 0750   06/05/17 0543  cefoTEtan in Dextrose 5% (CEFOTAN) IVPB 2 g     2 g Intravenous On call to O.R. 06/05/17 0543 06/05/17 0805   06/05/17 0543  neomycin (MYCIFRADIN) tablet 1,000 mg  Status:  Discontinued     1,000 mg Oral 3 times per day 06/05/17 0543 06/05/17 1422   06/05/17 0543  metroNIDAZOLE (FLAGYL) tablet 1,000 mg  Status:  Discontinued     1,000 mg Oral 3 times per day 06/05/17 0543 06/05/17 1422      Assessment/Plan: Problem List: Patient Active Problem List   Diagnosis Date Noted  . Colon cancer ([place]) 06/05/2017  . Essential hypertension 03/02/2017  . Preop cardiovascular exam 03/02/2017  . Mixed dyslipidemia 03/02/2017  . Overweight 03/02/2017  . BPH (benign prostatic hyperplasia) 03/06/2016  . Colon polyps 03/06/2016    Stable postop;  Appropriate ileus 1 Day Post-Op    LOS: 1 day   Matt B. Hassell Done, MD, [facility] [phone] beeper [phone]  06/06/2017 8:48 AM

## 2017-06-07 LAB — BASIC METABOLIC PANEL
Anion gap: 8 (ref 5–15)
BUN: 17 mg/dL (ref 6–20)
CO2: 26 mmol/L (ref 22–32)
Calcium: 9 mg/dL (ref 8.9–10.3)
Chloride: 102 mmol/L (ref 101–111)
Creatinine, Ser: 1.12 mg/dL (ref 0.61–1.24)
GFR calc Af Amer: >60 mL/min (ref >60)
GFR calc non Af Amer: >60 mL/min (ref >60)
Glucose, Bld: 131 mg/dL — ABNORMAL HIGH (ref 65–99)
Potassium: 3.9 mmol/L (ref 3.5–5.1)
Sodium: 136 mmol/L (ref 135–145)

## 2017-06-07 LAB — MAGNESIUM: Magnesium: 1.8 mg/dL (ref 1.7–2.4)

## 2017-06-07 LAB — CBC
HCT: 30.1 % — ABNORMAL LOW (ref 39.0–52.0)
Hemoglobin: 10.4 g/dL — ABNORMAL LOW (ref 13.0–17.0)
MCH: 30.3 pg (ref 26.0–34.0)
MCHC: 34.6 g/dL (ref 30.0–36.0)
MCV: 87.8 fL (ref 78.0–100.0)
Platelets: UNDETERMINED 10*3/uL (ref 150–400)
RBC: 3.43 MIL/uL — ABNORMAL LOW (ref 4.22–5.81)
RDW: 15.5 % (ref 11.5–15.5)
WBC: 8.8 10*3/uL (ref 4.0–10.5)

## 2017-06-07 LAB — PHOSPHORUS: Phosphorus: 2.3 mg/dL — ABNORMAL LOW (ref 2.5–4.6)

## 2017-06-07 NOTE — Evaluation (Signed)
Physical Therapy Evaluation Patient Details Name: Bryan Floyd MRN: [id number] DOB: [birth year] Today's Date: 06/07/2017   History of Present Illness  Mr. Congrove is a very pleasant 67 year old gentleman s/p Laparoscopic subtotal colectomy with ileocolic anastomosis and takedown of splenic flexure (06/05/17)  Clinical Impression  Pt admitted with above diagnosis. Pt is moving well and all education has been completed.  Will d/c from PT at this time.  If pt's status changes, please re-order.     Follow Up Recommendations No PT follow up    Equipment Recommendations  None recommended by PT    Recommendations for Other Services       Precautions / Restrictions Precautions Precaution Comments: abd surgery      Mobility  Bed Mobility Overal bed mobility: Needs Assistance Bed Mobility: Rolling;Sidelying to Sit Rolling: Supervision Sidelying to sit: Supervision       General bed mobility comments: S only with verbal cues for proper body mechanics  Transfers Overall transfer level: Modified independent Equipment used: None             General transfer comment: Bed raised to simulate bed height at home and no difficulty getting up  Ambulation/Gait Ambulation/Gait assistance: Supervision Ambulation Distance (Feet): 350 Feet Assistive device: None(IV pole) Gait Pattern/deviations: Step-through pattern     General Gait Details: Pt pushed IV pole for most of gait, but was able to ambulate without it as well with no balance deficits.  Stairs Stairs: (Verbally reviewed)          Wheelchair Mobility    Modified Rankin (Stroke Patients Only)       Balance Overall balance assessment: Modified Independent                                           Pertinent Vitals/Pain Pain Assessment: No/denies pain    Home Living Family/patient expects to be discharged to:: Private residence Living Arrangements: Spouse/significant other   Type of  Home: House Home Access: Stairs to enter Entrance Stairs-Rails: None Entrance Stairs-Number of Steps: 2 Home Layout: One level Home Equipment: Cane - single point;Cane - quad;Shower seat      Prior Function Level of Independence: Independent               Hand Dominance        Extremity/Trunk Assessment                Communication      Cognition Arousal/Alertness: Awake/alert Behavior During Therapy: WFL for tasks assessed/performed;Flat affect Overall Cognitive Status: Within Functional Limits for tasks assessed                                        General Comments General comments (skin integrity, edema, etc.): Wife present and asking about bathing and tub transfers.  Deferred to OT.    Exercises     Assessment/Plan    PT Assessment Patent does not need any further PT services  PT Problem List         PT Treatment Interventions      PT Goals (Current goals can be found in the Care Plan section)  Acute Rehab PT Goals PT Goal Formulation: All assessment and education complete, DC therapy    Frequency     Barriers to discharge  Co-evaluation               AM-PAC PT "6 Clicks" Daily Activity  Outcome Measure Difficulty turning over in bed (including adjusting bedclothes, sheets and blankets)?: None Difficulty moving from lying on back to sitting on the side of the bed? : None Difficulty sitting down on and standing up from a chair with arms (e.g., wheelchair, bedside commode, etc,.)?: None Help needed moving to and from a bed to chair (including a wheelchair)?: None Help needed walking in hospital room?: None Help needed climbing 3-5 steps with a railing? : None 6 Click Score: 24    End of Session   Activity Tolerance: Patient tolerated treatment well Patient left: in chair;with call bell/phone within reach;with family/visitor present Nurse Communication: Mobility status PT Visit Diagnosis: Difficulty in  walking, not elsewhere classified (R26.2)    Time: 5041-3643 PT Time Calculation (min) (ACUTE ONLY): 17 min   Charges:   PT Evaluation $PT Eval Low Complexity: 1 Low     PT G Codes:        Woodrow Dulski L. Tamala Julian, [place] Pager [phone] 06/07/2017   Galen Manila 06/07/2017, 2:51 PM

## 2017-06-07 NOTE — Progress Notes (Signed)
Patient ID: Bryan Floyd, male   DOB: Jun 09, 1950, 67 y.o.   MRN: [id number] [facility] Surgery Progress Note:   2 Days Post-Op  Subjective: Mental status is clear.  He reports flatus and small BM.   Objective: Vital signs in last 24 hours: Temp:  [98.1 F (36.7 C)-99.3 F (37.4 C)] 98.2 F (36.8 C) (04/14 0507) Pulse Rate:  [85-106] 93 (04/14 0507) Resp:  [14-22] 15 (04/14 0507) BP: (140-176)/(72-106) 176/75 (04/14 0507) SpO2:  [94 %-100 %] 95 % (04/14 0507) Weight:  [118.3 kg (260 lb 12.9 oz)] 118.3 kg (260 lb 12.9 oz) (04/14 0507)  Intake/Output from previous day: 04/13 0701 - 04/14 0700 In: 4096.3 [P.O.:1600; I.V.:2496.3] Out: 1025 [Urine:1025] Intake/Output this shift: No intake/output data recorded.  Physical Exam: Work of breathing is not labored.  In bed but has been getting up.    Lab Results:  Results for orders placed or performed during the hospital encounter of 06/05/17 (from the past 48 hour(s))  CBC     Status: Abnormal   Collection Time: 06/05/17  3:05 PM  Result Value Ref Range   WBC 10.6 (H) 4.0 - 10.5 K/uL   RBC 4.68 4.22 - 5.81 MIL/uL   Hemoglobin 14.3 13.0 - 17.0 g/dL   HCT 41.4 39.0 - 52.0 %   MCV 88.5 78.0 - 100.0 fL   MCH 30.6 26.0 - 34.0 pg   MCHC 34.5 30.0 - 36.0 g/dL   RDW 15.6 (H) 11.5 - 15.5 %   Platelets 319 150 - 400 K/uL    Comment: Performed at [facility], [place] [address], [place], [place] [address]  Creatinine, serum     Status: Abnormal   Collection Time: 06/05/17  3:05 PM  Result Value Ref Range   Creatinine, Ser 1.27 (H) 0.61 - 1.24 mg/dL   GFR calc non Af Amer 57 (L) >60 mL/min   GFR calc Af Amer >60 >60 mL/min    Comment: (NOTE) The eGFR has been calculated using the CKD EPI equation. This calculation has not been validated in all clinical situations. eGFR's persistently <60 mL/min signify possible Chronic Kidney Disease. Performed at [facility], [place] [address] [place], [place] [address]   CBC     Status: Abnormal   Collection Time: 06/06/17  4:04 AM  Result Value Ref Range   WBC 6.3 4.0 - 10.5 K/uL   RBC 3.98 (L) 4.22 - 5.81 MIL/uL   Hemoglobin 12.1 (L) 13.0 - 17.0 g/dL   HCT 35.4 (L) 39.0 - 52.0 %   MCV 88.9 78.0 - 100.0 fL   MCH 30.4 26.0 - 34.0 pg   MCHC 34.2 30.0 - 36.0 g/dL   RDW 15.6 (H) 11.5 - 15.5 %   Platelets 308 150 - 400 K/uL    Comment: Performed at [facility], [place] [address], [place], [place] [address]  Basic metabolic panel     Status: Abnormal   Collection Time: 06/06/17  4:04 AM  Result Value Ref Range   Sodium 138 135 - 145 mmol/L   Potassium 4.1 3.5 - 5.1 mmol/L   Chloride 104 101 - 111 mmol/L   CO2 24 22 - 32 mmol/L   Glucose, Bld 143 (H) 65 - 99 mg/dL   BUN 13 6 - 20 mg/dL   Creatinine, Ser 1.09 0.61 - 1.24 mg/dL   Calcium 9.0 8.9 - 10.3 mg/dL   GFR calc non Af Amer >60 >60 mL/min   GFR calc  Af Amer >60 >60 mL/min    Comment: (NOTE) The eGFR has been calculated using the CKD EPI equation. This calculation has not been validated in all clinical situations. eGFR's persistently <60 mL/min signify possible Chronic Kidney Disease.    Anion gap 10 5 - 15    Comment: Performed at [facility], [place] [address] [place], [place] [address]  Magnesium     Status: Abnormal   Collection Time: 06/06/17  4:04 AM  Result Value Ref Range   Magnesium 1.6 (L) 1.7 - 2.4 mg/dL    Comment: Performed at [facility], [place] [address], [place], [place] [address]  Phosphorus     Status: None   Collection Time: 06/06/17  4:04 AM  Result Value Ref Range   Phosphorus 3.7 2.5 - 4.6 mg/dL    Comment: Performed at [facility], [place] [address], [place], [place] [address]  CBC     Status: Abnormal   Collection Time: 06/07/17  3:45 AM  Result Value Ref Range   WBC 8.8 4.0 - 10.5 K/uL    Comment: WHITE COUNT CONFIRMED ON SMEAR   RBC 3.43 (L) 4.22 - 5.81 MIL/uL    Hemoglobin 10.4 (L) 13.0 - 17.0 g/dL   HCT 30.1 (L) 39.0 - 52.0 %   MCV 87.8 78.0 - 100.0 fL   MCH 30.3 26.0 - 34.0 pg   MCHC 34.6 30.0 - 36.0 g/dL   RDW 15.5 11.5 - 15.5 %   Platelets PLATELET CLUMPS NOTED ON SMEAR, UNABLE TO ESTIMATE 150 - 400 K/uL    Comment: Performed at [facility], [place] [address], [place], [place] [address]  Basic metabolic panel     Status: Abnormal   Collection Time: 06/07/17  3:45 AM  Result Value Ref Range   Sodium 136 135 - 145 mmol/L   Potassium 3.9 3.5 - 5.1 mmol/L   Chloride 102 101 - 111 mmol/L   CO2 26 22 - 32 mmol/L   Glucose, Bld 131 (H) 65 - 99 mg/dL   BUN 17 6 - 20 mg/dL   Creatinine, Ser 1.12 0.61 - 1.24 mg/dL   Calcium 9.0 8.9 - 10.3 mg/dL   GFR calc non Af Amer >60 >60 mL/min   GFR calc Af Amer >60 >60 mL/min    Comment: (NOTE) The eGFR has been calculated using the CKD EPI equation. This calculation has not been validated in all clinical situations. eGFR's persistently <60 mL/min signify possible Chronic Kidney Disease.    Anion gap 8 5 - 15    Comment: Performed at [facility], [place] [address], [place], [place] [address]  Magnesium     Status: None   Collection Time: 06/07/17  3:45 AM  Result Value Ref Range   Magnesium 1.8 1.7 - 2.4 mg/dL    Comment: Performed at [facility], [place] [address], [place], [place] [address]  Phosphorus     Status: Abnormal   Collection Time: 06/07/17  3:45 AM  Result Value Ref Range   Phosphorus 2.3 (L) 2.5 - 4.6 mg/dL    Comment: Performed at [facility], [place] [address] [place],  [address]    Radiology/Results: No results found.  Anti-infectives: Anti-infectives (From admission, onward)   Start     Dose/Rate Route Frequency Ordered Stop   06/05/17 0545  cefoTEtan in Dextrose 5% (CEFOTAN) 2-2.08 GM-%(50ML) IVPB    Note to Pharmacy:  Waldron Session   :  cabinet override      06/05/17 0545 06/05/17 0750    06/05/17 0543  cefoTEtan in Dextrose 5% (CEFOTAN) IVPB 2 g     2 g Intravenous On call to O.R. 06/05/17 0543 06/05/17 0805   06/05/17 0543  neomycin (MYCIFRADIN) tablet 1,000 mg  Status:  Discontinued     1,000 mg Oral 3 times per day 06/05/17 0543 06/05/17 1422   06/05/17 0543  metroNIDAZOLE (FLAGYL) tablet 1,000 mg  Status:  Discontinued     1,000 mg Oral 3 times per day 06/05/17 0543 06/05/17 1422      Assessment/Plan: Problem List: Patient Active Problem List   Diagnosis Date Noted  . Colon cancer ([place]) 06/05/2017  . Essential hypertension 03/02/2017  . Preop cardiovascular exam 03/02/2017  . Mixed dyslipidemia 03/02/2017  . Overweight 03/02/2017  . BPH (benign prostatic hyperplasia) 03/06/2016  . Colon polyps 03/06/2016   Will offer full liquids.  Progressing well postop 2 Days Post-Op    LOS: 2 days   Matt B. Hassell Done, MD, [facility] [phone] beeper [phone]  06/07/2017 8:24 AM

## 2017-06-07 NOTE — Evaluation (Signed)
Occupational Therapy Evaluation Patient Details Name: Bryan Floyd MRN: [id number] DOB: December 14, 1950 Today's Date: 06/07/2017    History of Present Illness Mr. Pulliam is a very pleasant 67 year old gentleman s/p Laparoscopic subtotal colectomy with ileocolic anastomosis and takedown of splenic flexure (06/05/17)   Clinical Impression   OT eval and edcuation complete    Follow Up Recommendations  No OT follow up    Equipment Recommendations  None recommended by OT    Recommendations for Other Services       Precautions / Restrictions Precautions Precaution Comments: abd surgery      Mobility Bed Mobility               General bed mobility comments: pt in chair  Transfers Overall transfer level: Modified independent                    Balance Overall balance assessment: Modified Independent                                         ADL either performed or assessed with clinical judgement   ADL Overall ADL's : Needs assistance/impaired     Grooming: Standing;Independent               Lower Body Dressing: Minimal assistance;Sit to/from stand Lower Body Dressing Details (indicate cue type and reason): wife will A and also may obtain a reacher.  Educated in bringing legs up to him rather then bending over         Tub/ Shower Transfer: Tub transfer;Supervision/safety   Functional mobility during ADLs: Supervision/safety General ADL Comments: Pt overall min A with ADL actvity. Educated on strategies to decrease pressure on abdomen as well as limit bending.  Educated on benefits of a Secondary school teacher. Wife will obtain.  Wife with questions regarding tub transfer. OT practiced with pt. Pt did well !     Vision         Perception     Praxis      Pertinent Vitals/Pain Pain Assessment: No/denies pain     Hand Dominance     Extremity/Trunk Assessment Upper Extremity Assessment Upper Extremity Assessment: Overall WFL for tasks  assessed           Communication Communication Communication: No difficulties   Cognition Arousal/Alertness: Awake/alert Behavior During Therapy: WFL for tasks assessed/performed Overall Cognitive Status: Within Functional Limits for tasks assessed                                        Exercises     Shoulder Instructions      Home Living Family/patient expects to be discharged to:: Private residence Living Arrangements: Spouse/significant other   Type of Home: House Home Access: Stairs to enter [employer] of Steps: 2 Entrance Stairs-Rails: None Home Layout: One level     Bathroom Shower/Tub: Tub/shower unit         Home Equipment: Cane - single point;Cane - quad;Shower seat          Prior Functioning/Environment Level of Independence: Independent                 OT Problem List:        OT Treatment/Interventions:      OT Goals(Current goals can be found in the care plan section)  Acute Rehab OT Goals Patient Stated Goal: home OT Goal Formulation: With patient  OT Frequency:      AM-PAC PT "6 Clicks" Daily Activity     Outcome Measure Help from another person eating meals?: None Help from another person taking care of personal grooming?: None Help from another person toileting, which includes using toliet, bedpan, or urinal?: A Little Help from another person bathing (including washing, rinsing, drying)?: A Little Help from another person to put on and taking off regular upper body clothing?: None Help from another person to put on and taking off regular lower body clothing?: A Little 6 Click Score: 21   End of Session    Activity Tolerance: Patient tolerated treatment well Patient left: in chair;with call bell/phone within reach;with family/visitor present                   Time: 1432-1445 OT Time Calculation (min): 13 min Charges:  OT General Charges $OT Visit: 1 Visit OT Evaluation $OT Eval Moderate  Complexity: 1 Mod G-Codes:     Kari Baars, OT [phone]  Payton Mccallum D 06/07/2017, 7:25 PM

## 2017-06-07 NOTE — Progress Notes (Signed)
Dr. Johney Maine made aware pt's foley remains. Pt progressing well. Ambulating independently, tolerating diet, and had 2 bm's. Order received to dc foley.

## 2017-06-08 LAB — BASIC METABOLIC PANEL
Anion gap: 10 (ref 5–15)
BUN: 12 mg/dL (ref 6–20)
CO2: 26 mmol/L (ref 22–32)
Calcium: 9.1 mg/dL (ref 8.9–10.3)
Chloride: 100 mmol/L — ABNORMAL LOW (ref 101–111)
Creatinine, Ser: 0.9 mg/dL (ref 0.61–1.24)
GFR calc Af Amer: >60 mL/min (ref >60)
GFR calc non Af Amer: >60 mL/min (ref >60)
Glucose, Bld: 116 mg/dL — ABNORMAL HIGH (ref 65–99)
Potassium: 3.5 mmol/L (ref 3.5–5.1)
Sodium: 136 mmol/L (ref 135–145)

## 2017-06-08 LAB — PHOSPHORUS: Phosphorus: 2 mg/dL — ABNORMAL LOW (ref 2.5–4.6)

## 2017-06-08 LAB — CBC
HCT: 26.7 % — ABNORMAL LOW (ref 39.0–52.0)
Hemoglobin: 9 g/dL — ABNORMAL LOW (ref 13.0–17.0)
MCH: 29.8 pg (ref 26.0–34.0)
MCHC: 33.7 g/dL (ref 30.0–36.0)
MCV: 88.4 fL (ref 78.0–100.0)
Platelets: 259 10*3/uL (ref 150–400)
RBC: 3.02 MIL/uL — ABNORMAL LOW (ref 4.22–5.81)
RDW: 15.2 % (ref 11.5–15.5)
WBC: 7.8 10*3/uL (ref 4.0–10.5)

## 2017-06-08 LAB — MAGNESIUM: Magnesium: 1.7 mg/dL (ref 1.7–2.4)

## 2017-06-08 MED ORDER — BOOST PLUS PO LIQD
237.0000 mL | Freq: Three times a day (TID) | ORAL | Status: DC
  Administered 2017-06-08 – 2017-06-09 (×4): 237 mL via ORAL
  Filled 2017-06-08 (×16): qty 237

## 2017-06-08 MED ORDER — ACETAMINOPHEN 500 MG PO TABS
1000.0000 mg | ORAL_TABLET | Freq: Four times a day (QID) | ORAL | Status: DC
  Administered 2017-06-08 – 2017-06-19 (×21): 1000 mg via ORAL
  Filled 2017-06-08 (×23): qty 2

## 2017-06-08 MED ORDER — POTASSIUM PHOSPHATE MONOBASIC 500 MG PO TABS
1000.0000 mg | ORAL_TABLET | Freq: Once | ORAL | Status: AC
  Administered 2017-06-08: 1000 mg via ORAL
  Filled 2017-06-08: qty 2

## 2017-06-08 MED ORDER — MAGNESIUM SULFATE 2 GM/50ML IV SOLN
2.0000 g | Freq: Once | INTRAVENOUS | Status: AC
  Administered 2017-06-08: 2 g via INTRAVENOUS
  Filled 2017-06-08: qty 50

## 2017-06-08 MED ORDER — IBUPROFEN 200 MG PO TABS: 600.0000 mg | ORAL_TABLET | Freq: Four times a day (QID) | ORAL | Status: DC | PRN

## 2017-06-08 NOTE — Care Management Important Message (Signed)
Important Message  Patient Details  Name: Amareon Phung MRN: [id number] Date of Birth: [birth year]   Medicare Important Message Given:  Yes    Kerin Salen 06/08/2017, 11:44 AMImportant Message  Patient Details  Name: Cadan Maggart MRN: [id number] Date of Birth: [birth year]   Medicare Important Message Given:  Yes    Kerin Salen 06/08/2017, 11:44 AM

## 2017-06-08 NOTE — Progress Notes (Addendum)
Subjective No acute events. Feeling better each day. Distention improving. Having flatus and BMs now - green, denies any blood in stool. Advanced to full liquids but hasn't tried any of those yet. Some nausea. No emesis. Ambulating. Pain controlled well. Foley out yesterday. Voiding spontaneously.  Objective: Vital signs in last 24 hours: Temp:  [99.4 F (37.4 C)-100 F (37.8 C)] 99.6 F (37.6 C) (04/15 0529) Pulse Rate:  [83-88] 83 (04/15 0529) Resp:  [18-19] 18 (04/15 0529) BP: (134-165)/(63-76) 149/76 (04/15 0529) SpO2:  [94 %-96 %] 96 % (04/15 0529) Weight:  [119.9 kg (264 lb 5.3 oz)] 119.9 kg (264 lb 5.3 oz) (04/15 0500) Last BM Date: 06/07/17  Intake/Output from previous day: 04/14 0701 - 04/15 0700 In: 6812 [P.O.:280; I.V.:1371] Out: 1750 [Urine:1750] Intake/Output this shift: No intake/output data recorded.  Gen: NAD, comfortable CV: RRR Pulm: Normal work of breathing Abd: Soft, NT, ND. Incisions c/d/i without erythema. Honeycomb in place Ext: SCDs in place  Lab Results: CBC  Recent Labs    06/07/17 0345 06/08/17 0409  WBC 8.8 7.8  HGB 10.4* 9.0*  HCT 30.1* 26.7*  PLT PLATELET CLUMPS NOTED ON SMEAR, UNABLE TO ESTIMATE 259   BMET Recent Labs    06/07/17 0345 06/08/17 0409  NA 136 136  K 3.9 3.5  CL 102 100*  CO2 26 26  GLUCOSE 131* 116*  BUN 17 12  CREATININE 1.12 0.90  CALCIUM 9.0 9.1   Anti-infectives: Anti-infectives (From admission, onward)   Start     Dose/Rate Route Frequency Ordered Stop   06/05/17 0545  cefoTEtan in Dextrose 5% (CEFOTAN) 2-2.08 GM-%(50ML) IVPB    Note to Pharmacy:  Waldron Session   : cabinet override      06/05/17 0545 06/05/17 0750   06/05/17 0543  cefoTEtan in Dextrose 5% (CEFOTAN) IVPB 2 g     2 g Intravenous On call to O.R. 06/05/17 0543 06/05/17 0805   06/05/17 0543  neomycin (MYCIFRADIN) tablet 1,000 mg  Status:  Discontinued     1,000 mg Oral 3 times per day 06/05/17 0543 06/05/17 1422   06/05/17 0543   metroNIDAZOLE (FLAGYL) tablet 1,000 mg  Status:  Discontinued     1,000 mg Oral 3 times per day 06/05/17 0543 06/05/17 1422       Assessment/Plan: Patient Active Problem List   Diagnosis Date Noted  . Colon cancer ([place]) 06/05/2017  . Essential hypertension 03/02/2017  . Preop cardiovascular exam 03/02/2017  . Mixed dyslipidemia 03/02/2017  . Overweight 03/02/2017  . BPH (benign prostatic hyperplasia) 03/06/2016  . Colon polyps 03/06/2016  ASSESSMENT Resolving expected postoperative ileus  s/p Procedure(s): LAPAROSCOPIC ASSISTED SUBTOTAL COLECTOMY WITH ILEO DESCENDING ANASTOMOSIS AND TAKEDOWN OF SPLENIC FLEXURE 06/05/2017  -Ambulate 5x/day -Full liquids; will switch to lactose free ensure as he reports issues with lactose in past -Hypomagnesemia - replace today with 2g IV magsulfate -KPhos for hypophosphatemia -D/C entereg given BMs -PPx: SQH, SCDs -Dispo: If tolerates fulls today, will advance to soft diet tomorrow   LOS: 3 days   Sharon Mt. Dema Severin, M.D. [facility]  Note: This dictation was prepared with Dragon/digital dictation along with [employer]. Any transcriptional errors that result from this process are unintentional.

## 2017-06-09 ENCOUNTER — Encounter (HOSPITAL_COMMUNITY): Payer: Self-pay | Admitting: *Deleted

## 2017-06-09 LAB — MAGNESIUM: Magnesium: 1.7 mg/dL (ref 1.7–2.4)

## 2017-06-09 LAB — CBC WITH DIFFERENTIAL/PLATELET
Basophils Absolute: 0 10*3/uL (ref 0.0–0.1)
Basophils Relative: 0 %
Eosinophils Absolute: 0.1 10*3/uL (ref 0.0–0.7)
Eosinophils Relative: 1 %
HCT: 29.6 % — ABNORMAL LOW (ref 39.0–52.0)
Hemoglobin: 10.2 g/dL — ABNORMAL LOW (ref 13.0–17.0)
Lymphocytes Relative: 17 %
Lymphs Abs: 1.5 10*3/uL (ref 0.7–4.0)
MCH: 30.5 pg (ref 26.0–34.0)
MCHC: 34.5 g/dL (ref 30.0–36.0)
MCV: 88.6 fL (ref 78.0–100.0)
Monocytes Absolute: 1.1 10*3/uL — ABNORMAL HIGH (ref 0.1–1.0)
Monocytes Relative: 12 %
Neutro Abs: 6.2 10*3/uL (ref 1.7–7.7)
Neutrophils Relative %: 70 %
Platelets: 315 10*3/uL (ref 150–400)
RBC: 3.34 MIL/uL — ABNORMAL LOW (ref 4.22–5.81)
RDW: 15.1 % (ref 11.5–15.5)
WBC: 8.8 10*3/uL (ref 4.0–10.5)

## 2017-06-09 LAB — BASIC METABOLIC PANEL WITH GFR
Anion gap: 15 (ref 5–15)
BUN: 12 mg/dL (ref 6–20)
CO2: 21 mmol/L — ABNORMAL LOW (ref 22–32)
Calcium: 9.5 mg/dL (ref 8.9–10.3)
Chloride: 96 mmol/L — ABNORMAL LOW (ref 101–111)
Creatinine, Ser: 0.82 mg/dL (ref 0.61–1.24)
GFR calc Af Amer: >60 mL/min
GFR calc non Af Amer: >60 mL/min
Glucose, Bld: 125 mg/dL — ABNORMAL HIGH (ref 65–99)
Potassium: 3.5 mmol/L (ref 3.5–5.1)
Sodium: 132 mmol/L — ABNORMAL LOW (ref 135–145)

## 2017-06-09 LAB — PHOSPHORUS: Phosphorus: 2.8 mg/dL (ref 2.5–4.6)

## 2017-06-09 MED ORDER — METOCLOPRAMIDE HCL 5 MG/ML IJ SOLN
10.0000 mg | Freq: Four times a day (QID) | INTRAMUSCULAR | Status: DC
  Administered 2017-06-09 – 2017-06-19 (×41): 10 mg via INTRAVENOUS
  Filled 2017-06-09 (×41): qty 2

## 2017-06-09 MED ORDER — MAGNESIUM SULFATE 2 GM/50ML IV SOLN
2.0000 g | Freq: Once | INTRAVENOUS | Status: AC
  Administered 2017-06-09: 2 g via INTRAVENOUS
  Filled 2017-06-09: qty 50

## 2017-06-09 MED ORDER — POTASSIUM CHLORIDE IN NACL 20-0.9 MEQ/L-% IV SOLN
INTRAVENOUS | Status: DC
  Administered 2017-06-09: 09:00:00 via INTRAVENOUS
  Administered 2017-06-09: 1000 mL via INTRAVENOUS
  Filled 2017-06-09 (×2): qty 1000

## 2017-06-09 NOTE — Progress Notes (Addendum)
Subjective No acute events. Some nausea this morning and an episode of emesis - complaining of indigestion/heartburn. Had 7-8 BMs in last 24hrs + flatus. Ambulating. Pain controlled well. Voiding spontaneously 800 cc + at least 5 unmeasured voids.  Objective: Vital signs in last 24 hours: Temp:  [98.1 F (36.7 C)-99.1 F (37.3 C)] 99.1 F (37.3 C) (04/16 0657) Pulse Rate:  [79-86] 86 (04/16 0657) Resp:  [18] 18 (04/16 0755) BP: (140-181)/(59-86) 181/86 (04/16 0657) SpO2:  [98 %-100 %] 100 % (04/16 0755) Last BM Date: 06/09/17  Intake/Output from previous day: 04/15 0701 - 04/16 0700 In: 3255 [P.O.:1400; I.V.:1855] Out: 1000 [Urine:800] Intake/Output this shift: No intake/output data recorded.  Gen: NAD, comfortable CV: RRR Pulm: Normal work of breathing Abd: Soft, NT, ND. Incisions c/d/i without erythema. Honeycomb in place Ext: SCDs in place  Lab Results: CBC  Recent Labs    06/08/17 0409 06/09/17 0431  WBC 7.8 8.8  HGB 9.0* 10.2*  HCT 26.7* 29.6*  PLT 259 315   BMET Recent Labs    06/08/17 0409 06/09/17 0431  NA 136 132*  K 3.5 3.5  CL 100* 96*  CO2 26 21*  GLUCOSE 116* 125*  BUN 12 12  CREATININE 0.90 0.82  CALCIUM 9.1 9.5   Anti-infectives: Anti-infectives (From admission, onward)   Start     Dose/Rate Route Frequency Ordered Stop   06/05/17 0545  cefoTEtan in Dextrose 5% (CEFOTAN) 2-2.08 GM-%(50ML) IVPB    Note to Pharmacy:  Waldron Session   : cabinet override      06/05/17 0545 06/05/17 0750   06/05/17 0543  cefoTEtan in Dextrose 5% (CEFOTAN) IVPB 2 g     2 g Intravenous On call to O.R. 06/05/17 0543 06/05/17 0805   06/05/17 0543  neomycin (MYCIFRADIN) tablet 1,000 mg  Status:  Discontinued     1,000 mg Oral 3 times per day 06/05/17 0543 06/05/17 1422   06/05/17 0543  metroNIDAZOLE (FLAGYL) tablet 1,000 mg  Status:  Discontinued     1,000 mg Oral 3 times per day 06/05/17 0543 06/05/17 1422       Assessment/Plan: Patient Active Problem List    Diagnosis Date Noted  . Colon cancer ([place]) 06/05/2017  . Essential hypertension 03/02/2017  . Preop cardiovascular exam 03/02/2017  . Mixed dyslipidemia 03/02/2017  . Overweight 03/02/2017  . BPH (benign prostatic hyperplasia) 03/06/2016  . Colon polyps 03/06/2016  ASSESSMENT Resolving expected postoperative ileus  s/p Procedure(s): LAPAROSCOPIC ASSISTED SUBTOTAL COLECTOMY WITH ILEO DESCENDING ANASTOMOSIS AND TAKEDOWN OF SPLENIC FLEXURE 06/05/2017  -Ambulate 5x/day -Will add reglan for indigestion - given that he's having multiple BMs ileus appears to be resolving but may be issue with gastric emptying. -Advised to take it easy on the liquids this morning and wait and see how the reglan works - if feeling better, ok for liquids as tolerated. -Mild hyponatremia - switch MIVF to NS c KCl -Hypomagnesemia - replace today with 2g IV magsulfate -Hypokalemia - MIVF to contain KCl 76mEq/bag -PPx: SQH, SCDs -Dispo: Awaiting toleration of diet   LOS: 4 days   Sharon Mt. Dema Severin, M.D. [facility]  Note: This dictation was prepared with Dragon/digital dictation along with [employer]. Any transcriptional errors that result from this process are unintentional.

## 2017-06-10 ENCOUNTER — Inpatient Hospital Stay (HOSPITAL_COMMUNITY): Payer: PPO

## 2017-06-10 DIAGNOSIS — R112 Nausea with vomiting, unspecified: Secondary | ICD-10-CM

## 2017-06-10 DIAGNOSIS — Z9889 Other specified postprocedural states: Secondary | ICD-10-CM

## 2017-06-10 DIAGNOSIS — C183 Malignant neoplasm of hepatic flexure: Secondary | ICD-10-CM

## 2017-06-10 LAB — MAGNESIUM: Magnesium: 2.2 mg/dL (ref 1.7–2.4)

## 2017-06-10 LAB — BASIC METABOLIC PANEL
Anion gap: 13 (ref 5–15)
BUN: 15 mg/dL (ref 6–20)
CO2: 24 mmol/L (ref 22–32)
Calcium: 9.6 mg/dL (ref 8.9–10.3)
Chloride: 98 mmol/L — ABNORMAL LOW (ref 101–111)
Creatinine, Ser: 0.94 mg/dL (ref 0.61–1.24)
GFR calc Af Amer: >60 mL/min (ref >60)
GFR calc non Af Amer: >60 mL/min (ref >60)
Glucose, Bld: 126 mg/dL — ABNORMAL HIGH (ref 65–99)
Potassium: 3.5 mmol/L (ref 3.5–5.1)
Sodium: 135 mmol/L (ref 135–145)

## 2017-06-10 LAB — PHOSPHORUS: Phosphorus: 2.6 mg/dL (ref 2.5–4.6)

## 2017-06-10 MED ORDER — HYDRALAZINE HCL 20 MG/ML IJ SOLN: 10.0000 mg | Freq: Four times a day (QID) | INTRAMUSCULAR | Status: DC | PRN

## 2017-06-10 MED ORDER — PANTOPRAZOLE SODIUM 40 MG PO PACK
40.0000 mg | PACK | Freq: Every day | ORAL | Status: DC
  Administered 2017-06-11 – 2017-06-12 (×2): 40 mg
  Filled 2017-06-10 (×3): qty 20

## 2017-06-10 MED ORDER — METOPROLOL TARTRATE 5 MG/5ML IV SOLN
5.0000 mg | Freq: Three times a day (TID) | INTRAVENOUS | Status: DC
  Administered 2017-06-10 – 2017-06-14 (×12): 5 mg via INTRAVENOUS
  Filled 2017-06-10 (×12): qty 5

## 2017-06-10 MED ORDER — SODIUM CHLORIDE 0.9 % IV SOLN
12.5000 mg | Freq: Four times a day (QID) | INTRAVENOUS | Status: DC | PRN
  Administered 2017-06-10 – 2017-06-13 (×7): 12.5 mg via INTRAVENOUS
  Filled 2017-06-10 (×7): qty 0.5

## 2017-06-10 MED ORDER — POTASSIUM CHLORIDE 10 MEQ/100ML IV SOLN
10.0000 meq | INTRAVENOUS | Status: AC
  Administered 2017-06-10 (×2): 10 meq via INTRAVENOUS
  Filled 2017-06-10 (×2): qty 100

## 2017-06-10 MED ORDER — LACTATED RINGERS IV SOLN
INTRAVENOUS | Status: AC
  Administered 2017-06-10 – 2017-06-11 (×3): via INTRAVENOUS
  Administered 2017-06-11 – 2017-06-12 (×2): 1000 mL via INTRAVENOUS
  Administered 2017-06-12: 15:00:00 via INTRAVENOUS

## 2017-06-10 NOTE — Consult Note (Signed)
Medical Consultation   Bryan Floyd  VEH:[id number]  DOB: [birth year]  DOA: 06/05/2017  PCP: Harlan Stains, MD   Outpatient Specialists: Urology, Dr. Junious Silk  Requesting physician: Dr. Dema Severin  Reason for consultation: Hypertension management  History of Present Illness: Bryan Floyd is an 67 y.o. male with past medical history significant for colon polyps with high-grade dysplasia, accelerated hypertension, hyperlipidemia, BPH, who was admitted 5 days ago by the surgery team due to multiple endoscopically unresectable colon polyps at the proximal ascending and splenic flexure.    Patient is POD #5 status post laparoscopic-assisted subtotal colectomy with ileol descending anastomosis and takedown of splenic flexure by Dr. Dema Severin.  Patient reports recent nausea.  NG tube placed with 2 L of bile fluid removed.   Abdominal x-ray revealed suspicion for ileus.  Patient is now n.p.o. [facility] was asked to manage hypertension with IV medications.  Reports 2 small bowel movements this morning prior to NG tube placement.  Nausea has resolved after NG tube placement.  Denies abdominal pain at this time. No flatus.   Review of Systems:  ROS as per HPI.  All other systems reviewed and are negative.   Past Medical History: Past Medical History:  Diagnosis Date  . Glaucoma   . Hypertension     Past Surgical History: Past Surgical History:  Procedure Laterality Date  . COLONOSCOPY WITH PROPOFOL N/A 03/25/2016   Procedure: COLONOSCOPY WITH PROPOFOL;  Surgeon: Clarene Essex, MD;  Location: [facility];  Service: Endoscopy;  Laterality: N/A;  . COLONOSCOPY WITH PROPOFOL N/A 04/10/2017   Procedure: COLONOSCOPY WITH PROPOFOL;  Surgeon: Ileana Roup, MD;  Location: [facility];  Service: General;  Laterality: N/A;  . CYSTOSCOPY N/A 07/24/2013   Procedure: CYSTOSCOPY;  Surgeon: Festus Aloe, MD;  Location: [facility];  Service: Urology;  Laterality: N/A;  .  EYE SURGERY     laser  . HOT HEMOSTASIS N/A 03/25/2016   Procedure: HOT HEMOSTASIS (ARGON PLASMA COAGULATION/BICAP);  Surgeon: Clarene Essex, MD;  Location: [facility];  Service: Endoscopy;  Laterality: N/A;  . LAPAROSCOPIC RIGHT HEMI COLECTOMY Right 06/05/2017   Procedure: LAPAROSCOPIC ASSISTED SUBTOTAL COLECTOMY WITH ILEO DESCENDING ANASTOMOSIS AND TAKEDOWN OF SPLENIC FLEXURE;  Surgeon: Ileana Roup, MD;  Location: [facility];  Service: General;  Laterality: Right;  . TRANSURETHRAL RESECTION OF PROSTATE N/A 07/24/2013   Procedure: TRANSURETHRAL RESECTION OF THE PROSTATE (TURP), staged;  Surgeon: Festus Aloe, MD;  Location: [facility];  Service: Urology;  Laterality: N/A;     Allergies:  No Known Allergies   Social History:  reports that he has never smoked. He has never used smokeless tobacco. He reports that he drinks about 0.6 oz of alcohol per week. He reports that he does not use drugs.   Family History: Family History  Problem Relation Age of Onset  . Stroke Father   . Heart disease Father   . Cancer Father      Physical Exam: Vitals:   06/09/17 2124 06/09/17 2128 06/10/17 0531 06/10/17 1233  BP: (!) 170/89 (!) 170/89 (!) 157/78 (!) 149/80  Pulse:  88 (!) 105 93  Resp:   14 18  Temp:  99.4 F (37.4 C) 98.1 F (36.7 C) 99.3 F (37.4 C)  TempSrc:  Oral Oral Oral  SpO2:  97% 99% 95%  Weight:      Height:        Constitutional: 67 year old African-American male well-developed well-nourished  in no acute distress. Alert and awake, oriented x3. Eyes: PERLA, EOMI, irises appear normal, anicteric sclera,  ENMT: external ears and nose appear normal, normal hearing.  Lips appears normal, oropharynx mucosa, tongue, posterior pharynx appear normal  Neck: neck appears normal, no masses, normal ROM, no thyromegaly, no JVD  CVS: S1-S2 clear, no murmur rubs or gallops, no LE edema, normal pedal pulses  Respiratory:  clear to auscultation bilaterally, no wheezing, rales or  rhonchi. Respiratory effort normal. No accessory muscle use.  Abdomen: Stitches are present from laparoscopy.  Hypoactive bowel sounds with mild distention.   Musculoskeletal: no cyanosis, clubbing or edema noted bilaterally.  No contractures or atrophy.  Moves all 4 extremities freely.  No lower extremity edema. Neuro: Cranial nerves II-XII intact, strength, sensation, reflexes.  No focal motor deficit. Psych: judgement and insight appear normal, stable mood and affect, mental status Skin: no rashes or lesions or ulcers.  Stitches as noted above. No induration or nodules    Data reviewed:  I have personally reviewed following labs and imaging studies Labs:  CBC: Recent Labs  Lab 06/05/17 1505 06/06/17 0404 06/07/17 0345 06/08/17 0409 06/09/17 0431  WBC 10.6* 6.3 8.8 7.8 8.8  NEUTROABS  --   --   --   --  6.2  HGB 14.3 12.1* 10.4* 9.0* 10.2*  HCT 41.4 35.4* 30.1* 26.7* 29.6*  MCV 88.5 88.9 87.8 88.4 88.6  PLT 319 308 PLATELET CLUMPS NOTED ON SMEAR, UNABLE TO ESTIMATE 259 616    Basic Metabolic Panel: Recent Labs  Lab 06/06/17 0404 06/07/17 0345 06/08/17 0409 06/09/17 0431 06/10/17 0430  NA 138 136 136 132* 135  K 4.1 3.9 3.5 3.5 3.5  CL 104 102 100* 96* 98*  CO2 24 26 26  21* 24  GLUCOSE 143* 131* 116* 125* 126*  BUN 13 17 12 12 15   CREATININE 1.09 1.12 0.90 0.82 0.94  CALCIUM 9.0 9.0 9.1 9.5 9.6  MG 1.6* 1.8 1.7 1.7 2.2  PHOS 3.7 2.3* 2.0* 2.8 2.6   GFR Estimated Creatinine Clearance: 96.6 mL/min (by C-G formula based on SCr of 0.94 mg/dL). Liver Function Tests: No results for input(s): AST, ALT, ALKPHOS, BILITOT, PROT, ALBUMIN in the last 168 hours. No results for input(s): LIPASE, AMYLASE in the last 168 hours. No results for input(s): AMMONIA in the last 168 hours. Coagulation profile No results for input(s): INR, PROTIME in the last 168 hours.  Cardiac Enzymes: No results for input(s): CKTOTAL, CKMB, CKMBINDEX, TROPONINI in the last 168 hours. BNP: Invalid  input(s): POCBNP CBG: No results for input(s): GLUCAP in the last 168 hours. D-Dimer No results for input(s): DDIMER in the last 72 hours. Hgb A1c No results for input(s): HGBA1C in the last 72 hours. Lipid Profile No results for input(s): CHOL, HDL, LDLCALC, TRIG, CHOLHDL, LDLDIRECT in the last 72 hours. Thyroid function studies No results for input(s): TSH, T4TOTAL, T3FREE, THYROIDAB in the last 72 hours.  Invalid input(s): FREET3 Anemia work up No results for input(s): VITAMINB12, FOLATE, FERRITIN, TIBC, IRON, RETICCTPCT in the last 72 hours. Urinalysis    Component Value Date/Time   COLORURINE RED (A) 07/23/2013 0402   APPEARANCEUR CLOUDY (A) 07/23/2013 0402   LABSPEC 1.024 07/23/2013 0402   PHURINE 5.5 07/23/2013 0402   GLUCOSEU 100 (A) 07/23/2013 0402   HGBUR LARGE (A) 07/23/2013 0402   BILIRUBINUR LARGE (A) 07/23/2013 0402   BILIRUBINUR negative 07/16/2013 1653   KETONESUR 40 (A) 07/23/2013 0402   PROTEINUR >300 (A) 07/23/2013 0402  UROBILINOGEN 2.0 (H) 07/23/2013 0402   NITRITE POSITIVE (A) 07/23/2013 0402   LEUKOCYTESUR LARGE (A) 07/23/2013 0402     Microbiology No results found for this or any previous visit (from the past 240 hour(s)).     Inpatient Medications:   Scheduled Meds: . acetaminophen  1,000 mg Oral Q6H  . amLODipine  10 mg Oral Daily  . brimonidine  1 drop Both Eyes BID   And  . timolol  1 drop Both Eyes BID  . finasteride  5 mg Oral Daily  . heparin injection (subcutaneous)  5,000 Units Subcutaneous Q8H  . hydrALAZINE  10 mg Oral TID  . hydrochlorothiazide  25 mg Oral Daily  . irbesartan  37.5 mg Oral Daily  . lactose free nutrition  237 mL Oral TID WC  . latanoprost  1 drop Both Eyes QHS  . metoCLOPramide (REGLAN) injection  10 mg Intravenous Q6H  . pantoprazole sodium  40 mg Per Tube Daily  . rosuvastatin  5 mg Oral q1800  . saccharomyces boulardii  250 mg Oral BID  . tamsulosin  0.4 mg Oral QPC supper   Continuous Infusions: .  chlorproMAZINE (THORAZINE) IV Stopped (06/10/17 0518)  . lactated ringers 125 mL/hr at 06/10/17 0816     Radiological Exams on Admission: Dg Abd 1 View  Result Date: 06/10/2017 CLINICAL DATA:  67 year old male status post NG tube placement. Postoperative day 5 status post laparoscopic subtotal colectomy with ileocolic anastomosis. EXAM: ABDOMEN - 1 VIEW COMPARISON:  CT chest, abdomen and Pelvis 01/30/2017 FINDINGS: Portable AP semi upright view at enteric tube courses to the left upper quadrant and loops in the proximal stomach, side hole up the level of the fundus. Gas-filled small bowel loops in the mid and right abdomen are at the upper limits of normal to mildly dilated. Paucity of bowel gas elsewhere. Mild lung base atelectasis suspected. No pneumoperitoneum identified on these supine views. No acute osseous abnormality identified. Right hip osteoarthritis versus AVN. IMPRESSION: 1. NG tube looped in the proximal stomach, side hole the level of the fundus. 2. Small volume bowel gas overall. Upper limits of normal to mildly dilated mid and right abdominal loops. Electronically Signed   By: Genevie Ann M.D.   On: 06/10/2017 08:52    Impression/Recommendations Active Problems:   Colon cancer (HCC)  Accelerated hypertension Home antihypertensive regimen includes amlodipine 10 mg daily, hydralazine 10 mg 3 times daily, HCTZ 25 mg daily, olmesartan 40 mg daily. No CKD or history of diabetes. Will start IV lopressor 5mg  TID and IV hydralazine 10 mg q6H prn for SBP>180 or DBP>105. Continue to monitor vital signs closely.  Multiple tubular adenomas with high grade dysplasia POD #5 status post laparoscopy subtotal colectomy with ileal descending anastomosis and takedown of splenic flexure by Dr. Dema Severin General surgery following  Suspected ileus Personally reviewed abd xray done on 06/10/17 revealing bowel mildly filled with gas proximally and transversally.  Has NG tube in place to suction NPO Gen  surgery following  Acute blood loss anemia Most likely secondary to surgery Hemoglobin is stable at 10.2 No sign of overt bleeding  Hyperlipidemia On statin but currently n.p.o. Resume medication when no longer n.p.o.  Obesity BMI 36 Recommend healthy dieting and regular exercise    Thank you for this consultation.  Our [facility] hospitalist team will follow the patient with you.   Time Spent: 35 minutes  Kayleen Memos M.D. [facility] 06/10/2017, 1:09 PM

## 2017-06-10 NOTE — Progress Notes (Signed)
Subjective Nausea and emesis overnight. Hiccups this morning. Continues to have BMs - 4 in last 24hrs. Denies abdominal pain. Ambulating  Objective: Vital signs in last 24 hours: Temp:  [97.9 F (36.6 C)-99.4 F (37.4 C)] 98.1 F (36.7 C) (04/17 0531) Pulse Rate:  [79-105] 105 (04/17 0531) Resp:  [14-18] 14 (04/17 0531) BP: (154-170)/(75-89) 157/78 (04/17 0531) SpO2:  [97 %-99 %] 99 % (04/17 0531) Weight:  [114.6 kg (252 lb 11.2 oz)-120.8 kg (266 lb 5.1 oz)] 114.6 kg (252 lb 11.2 oz) (04/16 1927) Last BM Date: 06/10/17  Intake/Output from previous day: 04/16 0701 - 04/17 0700 In: 1932.5 [P.O.:330; I.V.:1527.5; IV Piggyback:75] Out: 250 [Urine:250] Intake/Output this shift: Total I/O In: -  Out: 2000 [Emesis/NG output:2000]  Gen: NAD, comfortable CV: RRR Pulm: Normal work of breathing Abd: Soft, NT, ND. Incisions c/d/i without erythema. Honeycomb in place Ext: SCDs in place  Lab Results: CBC  Recent Labs    06/08/17 0409 06/09/17 0431  WBC 7.8 8.8  HGB 9.0* 10.2*  HCT 26.7* 29.6*  PLT 259 315   BMET Recent Labs    06/09/17 0431 06/10/17 0430  NA 132* 135  K 3.5 3.5  CL 96* 98*  CO2 21* 24  GLUCOSE 125* 126*  BUN 12 15  CREATININE 0.82 0.94  CALCIUM 9.5 9.6   Anti-infectives: Anti-infectives (From admission, onward)   Start     Dose/Rate Route Frequency Ordered Stop   06/05/17 0545  cefoTEtan in Dextrose 5% (CEFOTAN) 2-2.08 GM-%(50ML) IVPB    Note to Pharmacy:  Waldron Session   : cabinet override      06/05/17 0545 06/05/17 0750   06/05/17 0543  cefoTEtan in Dextrose 5% (CEFOTAN) IVPB 2 g     2 g Intravenous On call to O.R. 06/05/17 0543 06/05/17 0805   06/05/17 0543  neomycin (MYCIFRADIN) tablet 1,000 mg  Status:  Discontinued     1,000 mg Oral 3 times per day 06/05/17 0543 06/05/17 1422   06/05/17 0543  metroNIDAZOLE (FLAGYL) tablet 1,000 mg  Status:  Discontinued     1,000 mg Oral 3 times per day 06/05/17 0543 06/05/17 1422        Assessment/Plan: Patient Active Problem List   Diagnosis Date Noted  . Colon cancer ([place]) 06/05/2017  . Essential hypertension 03/02/2017  . Preop cardiovascular exam 03/02/2017  . Mixed dyslipidemia 03/02/2017  . Overweight 03/02/2017  . BPH (benign prostatic hyperplasia) 03/06/2016  . Colon polyps 03/06/2016  ASSESSMENT Expected postoperative ileus  s/p Procedure(s): LAPAROSCOPIC ASSISTED SUBTOTAL COLECTOMY WITH ILEO DESCENDING ANASTOMOSIS AND TAKEDOWN OF SPLENIC FLEXURE 06/05/2017 (POD#5)  -Afebrile, vitals stable, exam benign, wbc normal yesterday -Abd XR, NPO, NGT given ongoing hiccups and emesis -MIVF -Ambulate 5x/day -Hypokalemia - 53mEq IV today -Labs tomorrow -PPx: SQH, SCDs -Dispo: Awaiting resolution of ileus   LOS: 5 days   Sharon Mt. Dema Severin, M.D. [facility]  Note: This dictation was prepared with Dragon/digital dictation along with [employer]. Any transcriptional errors that result from this process are unintentional.

## 2017-06-11 ENCOUNTER — Inpatient Hospital Stay: Payer: Self-pay

## 2017-06-11 ENCOUNTER — Encounter (HOSPITAL_COMMUNITY): Payer: Self-pay

## 2017-06-11 DIAGNOSIS — I10 Essential (primary) hypertension: Secondary | ICD-10-CM

## 2017-06-11 LAB — CBC WITH DIFFERENTIAL/PLATELET
Basophils Absolute: 0.1 10*3/uL (ref 0.0–0.1)
Basophils Relative: 1 %
Eosinophils Absolute: 1.1 10*3/uL — ABNORMAL HIGH (ref 0.0–0.7)
Eosinophils Relative: 10 %
HCT: 28.5 % — ABNORMAL LOW (ref 39.0–52.0)
Hemoglobin: 9.9 g/dL — ABNORMAL LOW (ref 13.0–17.0)
Lymphocytes Relative: 18 %
Lymphs Abs: 2 10*3/uL (ref 0.7–4.0)
MCH: 31 pg (ref 26.0–34.0)
MCHC: 34.7 g/dL (ref 30.0–36.0)
MCV: 89.3 fL (ref 78.0–100.0)
Monocytes Absolute: 1.6 10*3/uL — ABNORMAL HIGH (ref 0.1–1.0)
Monocytes Relative: 14 %
Neutro Abs: 6.5 10*3/uL (ref 1.7–7.7)
Neutrophils Relative %: 57 %
Platelets: 436 10*3/uL — ABNORMAL HIGH (ref 150–400)
RBC: 3.19 MIL/uL — ABNORMAL LOW (ref 4.22–5.81)
RDW: 15.8 % — ABNORMAL HIGH (ref 11.5–15.5)
WBC: 11.3 10*3/uL — ABNORMAL HIGH (ref 4.0–10.5)

## 2017-06-11 LAB — BASIC METABOLIC PANEL
Anion gap: 13 (ref 5–15)
BUN: 19 mg/dL (ref 6–20)
CO2: 27 mmol/L (ref 22–32)
Calcium: 9.2 mg/dL (ref 8.9–10.3)
Chloride: 99 mmol/L — ABNORMAL LOW (ref 101–111)
Creatinine, Ser: 0.98 mg/dL (ref 0.61–1.24)
GFR calc Af Amer: >60 mL/min (ref >60)
GFR calc non Af Amer: >60 mL/min (ref >60)
Glucose, Bld: 113 mg/dL — ABNORMAL HIGH (ref 65–99)
Potassium: 3.3 mmol/L — ABNORMAL LOW (ref 3.5–5.1)
Sodium: 139 mmol/L (ref 135–145)

## 2017-06-11 LAB — MAGNESIUM: Magnesium: 2 mg/dL (ref 1.7–2.4)

## 2017-06-11 LAB — PHOSPHORUS: Phosphorus: 3 mg/dL (ref 2.5–4.6)

## 2017-06-11 MED ORDER — POTASSIUM CHLORIDE 10 MEQ/100ML IV SOLN
10.0000 meq | INTRAVENOUS | Status: AC
  Administered 2017-06-11 (×4): 10 meq via INTRAVENOUS
  Filled 2017-06-11 (×4): qty 100

## 2017-06-11 MED ORDER — SODIUM CHLORIDE 0.9% FLUSH
10.0000 mL | INTRAVENOUS | Status: DC | PRN
  Administered 2017-06-15: 10 mL
  Filled 2017-06-11: qty 40

## 2017-06-11 MED ORDER — LACTATED RINGERS IV BOLUS
1000.0000 mL | Freq: Once | INTRAVENOUS | Status: AC
  Administered 2017-06-11: 1000 mL via INTRAVENOUS

## 2017-06-11 NOTE — Progress Notes (Signed)
Peripherally Inserted Central Catheter/Midline Placement  The IV Nurse has discussed with the patient and/or persons authorized to consent for the patient, the purpose of this procedure and the potential benefits and risks involved with this procedure.  The benefits include less needle sticks, lab draws from the catheter, and the patient may be discharged home with the catheter. Risks include, but not limited to, infection, bleeding, blood clot (thrombus formation), and puncture of an artery; nerve damage and irregular heartbeat and possibility to perform a PICC exchange if needed/ordered by physician.  Alternatives to this procedure were also discussed.  Bard Power PICC patient education guide, fact sheet on infection prevention and patient information card has been provided to patient /or left at bedside.    PICC/Midline Placement Documentation  PICC Double Lumen 33/54/56 PICC Right Basilic 41 cm 0 cm (Active)  Indication for Insertion or Continuance of Line Administration of hyperosmolar/irritating solutions (i.e. TPN, Vancomycin, etc.) 06/11/2017  9:25 PM  Exposed Catheter (cm) 0 cm 06/11/2017  9:25 PM  Site Assessment Clean;Dry;Intact 06/11/2017  9:25 PM  Lumen #1 Status Flushed;Saline locked;Blood return noted 06/11/2017  9:25 PM  Lumen #2 Status Flushed;Saline locked;Blood return noted 06/11/2017  9:25 PM  Dressing Type Transparent 06/11/2017  9:25 PM  Dressing Status Clean;Dry;Intact;Antimicrobial disc in place 06/11/2017  9:25 PM  Dressing Change Due 06/18/17 06/11/2017  9:25 PM       Perlie Scheuring, Nicolette Bang 06/11/2017, 9:26 PM

## 2017-06-11 NOTE — Progress Notes (Signed)
PROGRESS NOTE    Bryan Floyd  GQQ:[id number] DOB: [birth year] DOA: 06/05/2017 PCP: Harlan Stains, MD   Brief Narrative: Bryan Floyd is a 67 y.o. male with past medical history significant for colon polyps with high-grade dysplasia, accelerated hypertension, hyperlipidemia, BPH. He presented for surgery to remove colonic polyps. He is s/p subtotal colectomy w/ anastomosis. He developed uncontrolled hypertension and is currently nothing by mouth.   Assessment & Plan:   Active Problems:   Colon cancer ([place])   Essential hypertension Hypertensive urgency Complicated by NPO status. Improved with metoprolol -Continue metoprolol IV scheduled -On irbesartan, hydrochlorothiazide, hydralazine by mouth, but currently with NG tube  Colon cancer s/p resection Ileus Per primary  Acute blood loss anemia Secondary to recent surgery. Stable.  Leukocytosis Mild. -repeat CBC in AM  Hyperlipidemia On Crestor as an outpatient. -restart Crestor once patient is taking by mouth  Obesity Body mass index is 37.14 kg/m.    DVT prophylaxis: Per primary Code Status:   Code Status: Full Code Family Communication: None at bedside Disposition Plan: Per primary   Procedures:   Subtotal colectomy w/ anastomosis  Antimicrobials:  Cefotetan (4/12)    Subjective: Not passing gas but having bowel movements. Had some lightheadedness/weakness after using the toilet this morning.  Objective: Vitals:   06/10/17 0531 06/10/17 1233 06/10/17 2024 06/11/17 0358  BP: (!) 157/78 (!) 149/80 (!) 169/88 136/72  Pulse: (!) 105 93 (!) 106 83  Resp: 14 18 18 17   Temp: 98.1 F (36.7 C) 99.3 F (37.4 C) 99.7 F (37.6 C) 99.7 F (37.6 C)  TempSrc: Oral Oral Oral Oral  SpO2: 99% 95% 95% 95%  Weight:    117.4 kg (258 lb 13.1 oz)  Height:        Intake/Output Summary (Last 24 hours) at 06/11/2017 1214 Last data filed at 06/11/2017 0931 Gross per 24 hour  Intake 2741.67 ml  Output 2125  ml  Net 616.67 ml   Filed Weights   06/09/17 1329 06/09/17 1927 06/11/17 0358  Weight: 120.8 kg (266 lb 5.1 oz) 114.6 kg (252 lb 11.2 oz) 117.4 kg (258 lb 13.1 oz)    Examination:  General exam: Appears calm and comfortable Respiratory system: Clear to auscultation. Respiratory effort normal. Cardiovascular system: S1 & S2 heard, RRR. No murmurs, rubs, gallops or clicks. Gastrointestinal system: Abdomen is nondistended, soft and nontender. No organomegaly or masses felt. Normal bowel sounds heard. Central nervous system: Alert and oriented. No focal neurological deficits. Extremities: No edema. No calf tenderness Skin: No cyanosis. No rashes Psychiatry: Judgement and insight appear normal. Mood & affect appropriate.     Data Reviewed: I have personally reviewed following labs and imaging studies  CBC: Recent Labs  Lab 06/06/17 0404 06/07/17 0345 06/08/17 0409 06/09/17 0431 06/11/17 0815  WBC 6.3 8.8 7.8 8.8 11.3*  NEUTROABS  --   --   --  6.2 6.5  HGB 12.1* 10.4* 9.0* 10.2* 9.9*  HCT 35.4* 30.1* 26.7* 29.6* 28.5*  MCV 88.9 87.8 88.4 88.6 89.3  PLT 308 PLATELET CLUMPS NOTED ON SMEAR, UNABLE TO ESTIMATE 259 315 671*   Basic Metabolic Panel: Recent Labs  Lab 06/07/17 0345 06/08/17 0409 06/09/17 0431 06/10/17 0430 06/11/17 0815  NA 136 136 132* 135 139  K 3.9 3.5 3.5 3.5 3.3*  CL 102 100* 96* 98* 99*  CO2 26 26 21* 24 27  GLUCOSE 131* 116* 125* 126* 113*  BUN 17 12 12 15 19   CREATININE 1.12 0.90 0.82 0.94 0.98  CALCIUM 9.0 9.1 9.5 9.6 9.2  MG 1.8 1.7 1.7 2.2 2.0  PHOS 2.3* 2.0* 2.8 2.6 3.0   GFR: Estimated Creatinine Clearance: 93.9 mL/min (by C-G formula based on SCr of 0.98 mg/dL). Liver Function Tests: No results for input(s): AST, ALT, ALKPHOS, BILITOT, PROT, ALBUMIN in the last 168 hours. No results for input(s): LIPASE, AMYLASE in the last 168 hours. No results for input(s): AMMONIA in the last 168 hours. Coagulation Profile: No results for input(s):  INR, PROTIME in the last 168 hours. Cardiac Enzymes: No results for input(s): CKTOTAL, CKMB, CKMBINDEX, TROPONINI in the last 168 hours. BNP (last 3 results) No results for input(s): PROBNP in the last 8760 hours. HbA1C: No results for input(s): HGBA1C in the last 72 hours. CBG: No results for input(s): GLUCAP in the last 168 hours. Lipid Profile: No results for input(s): CHOL, HDL, LDLCALC, TRIG, CHOLHDL, LDLDIRECT in the last 72 hours. Thyroid Function Tests: No results for input(s): TSH, T4TOTAL, FREET4, T3FREE, THYROIDAB in the last 72 hours. Anemia Panel: No results for input(s): VITAMINB12, FOLATE, FERRITIN, TIBC, IRON, RETICCTPCT in the last 72 hours. Sepsis Labs: No results for input(s): PROCALCITON, LATICACIDVEN in the last 168 hours.  No results found for this or any previous visit (from the past 240 hour(s)).       Radiology Studies: Dg Abd 1 View  Result Date: 06/10/2017 CLINICAL DATA:  67 year old male status post NG tube placement. Postoperative day 5 status post laparoscopic subtotal colectomy with ileocolic anastomosis. EXAM: ABDOMEN - 1 VIEW COMPARISON:  CT chest, abdomen and Pelvis 01/30/2017 FINDINGS: Portable AP semi upright view at enteric tube courses to the left upper quadrant and loops in the proximal stomach, side hole up the level of the fundus. Gas-filled small bowel loops in the mid and right abdomen are at the upper limits of normal to mildly dilated. Paucity of bowel gas elsewhere. Mild lung base atelectasis suspected. No pneumoperitoneum identified on these supine views. No acute osseous abnormality identified. Right hip osteoarthritis versus AVN. IMPRESSION: 1. NG tube looped in the proximal stomach, side hole the level of the fundus. 2. Small volume bowel gas overall. Upper limits of normal to mildly dilated mid and right abdominal loops. Electronically Signed   By: Genevie Ann M.D.   On: 06/10/2017 08:52   [place] Ekg Site Rite  Result Date: 06/11/2017 If Site  Rite image not attached, placement could not be confirmed due to current cardiac rhythm.       Scheduled Meds: . acetaminophen  1,000 mg Oral Q6H  . amLODipine  10 mg Oral Daily  . brimonidine  1 drop Both Eyes BID   And  . timolol  1 drop Both Eyes BID  . finasteride  5 mg Oral Daily  . heparin injection (subcutaneous)  5,000 Units Subcutaneous Q8H  . hydrALAZINE  10 mg Oral TID  . hydrochlorothiazide  25 mg Oral Daily  . irbesartan  37.5 mg Oral Daily  . lactose free nutrition  237 mL Oral TID WC  . latanoprost  1 drop Both Eyes QHS  . metoCLOPramide (REGLAN) injection  10 mg Intravenous Q6H  . metoprolol tartrate  5 mg Intravenous Q8H  . pantoprazole sodium  40 mg Per Tube Daily  . rosuvastatin  5 mg Oral q1800  . saccharomyces boulardii  250 mg Oral BID  . tamsulosin  0.4 mg Oral QPC supper   Continuous Infusions: . chlorproMAZINE (THORAZINE) IV 12.5 mg (06/11/17 1132)  . lactated ringers 125 mL/hr  at 06/10/17 2354  . potassium chloride       LOS: 6 days     Cordelia Poche, MD [facility] 06/11/2017, 12:14 PM Pager: [phone]  If 7PM-7AM, please contact night-coverage [email or web address] Password [facility] 06/11/2017, 12:14 PM

## 2017-06-11 NOTE — Progress Notes (Signed)
Subjective NGT in place - no further nausea/emesis. Initially got 3L out; 750cc overnight.  Hiccups but improving. Continues to have smaller BMs - 3 in last 24hrs. Denies abdominal pain. Ambulating multiple times per day.  Objective: Vital signs in last 24 hours: Temp:  [99.3 F (37.4 C)-99.7 F (37.6 C)] 99.7 F (37.6 C) (04/18 0358) Pulse Rate:  [83-106] 83 (04/18 0358) Resp:  [17-18] 17 (04/18 0358) BP: (136-169)/(72-88) 136/72 (04/18 0358) SpO2:  [95 %] 95 % (04/18 0358) Weight:  [117.4 kg (258 lb 13.1 oz)] 117.4 kg (258 lb 13.1 oz) (04/18 0358) Last BM Date: 06/10/17  Intake/Output from previous day: 04/17 0701 - 04/18 0700 In: 2741.7 [I.V.:2716.7; IV Piggyback:25] Out: 5397 [Urine:550; Emesis/NG output:4025] Intake/Output this shift: No intake/output data recorded.  Gen: NAD, comfortable CV: RRR Pulm: Normal work of breathing Abd: Soft, NT, ND. Incisions c/d/i without erythema. Ext: SCDs in place  Lab Results: CBC  Recent Labs    06/09/17 0431  WBC 8.8  HGB 10.2*  HCT 29.6*  PLT 315   BMET Recent Labs    06/09/17 0431 06/10/17 0430  NA 132* 135  K 3.5 3.5  CL 96* 98*  CO2 21* 24  GLUCOSE 125* 126*  BUN 12 15  CREATININE 0.82 0.94  CALCIUM 9.5 9.6   Anti-infectives: Anti-infectives (From admission, onward)   Start     Dose/Rate Route Frequency Ordered Stop   06/05/17 0545  cefoTEtan in Dextrose 5% (CEFOTAN) 2-2.08 GM-%(50ML) IVPB    Note to Pharmacy:  Waldron Session   : cabinet override      06/05/17 0545 06/05/17 0750   06/05/17 0543  cefoTEtan in Dextrose 5% (CEFOTAN) IVPB 2 g     2 g Intravenous On call to O.R. 06/05/17 0543 06/05/17 0805   06/05/17 0543  neomycin (MYCIFRADIN) tablet 1,000 mg  Status:  Discontinued     1,000 mg Oral 3 times per day 06/05/17 0543 06/05/17 1422   06/05/17 0543  metroNIDAZOLE (FLAGYL) tablet 1,000 mg  Status:  Discontinued     1,000 mg Oral 3 times per day 06/05/17 0543 06/05/17 1422        Assessment/Plan: Patient Active Problem List   Diagnosis Date Noted  . Colon cancer ([place]) 06/05/2017  . Essential hypertension 03/02/2017  . Preop cardiovascular exam 03/02/2017  . Mixed dyslipidemia 03/02/2017  . Overweight 03/02/2017  . BPH (benign prostatic hyperplasia) 03/06/2016  . Colon polyps 03/06/2016  ASSESSMENT Expected postoperative ileus  s/p Procedure(s): LAPAROSCOPIC ASSISTED SUBTOTAL COLECTOMY WITH ILEO DESCENDING ANASTOMOSIS AND TAKEDOWN OF SPLENIC FLEXURE 06/05/2017 (POD#6)  -Afebrile, vitals stable, exam benign, wbc normal yesterday -Abd XR yesterday was not that impressive but given NG output does appear to have an ileus as opposed to delayed gastric emptying -NPO, NGT, MIVF; bolus 1L this morning to keep up with losses -Labs pending -Ambulate 5x/day -Anticipate will need TPN given POD#6 today so will go ahead with PICC placement. If unimproved tomorrow, will plan CT A/P with PO & IV contrast -PPx: SQH, SCDs -Dispo: Awaiting resolution of ileus   LOS: 6 days   Sharon Mt. Dema Severin, M.D. [facility]  Note: This dictation was prepared with Dragon/digital dictation along with [employer]. Any transcriptional errors that result from this process are unintentional.

## 2017-06-12 LAB — MAGNESIUM: Magnesium: 2 mg/dL (ref 1.7–2.4)

## 2017-06-12 LAB — GLUCOSE, CAPILLARY
Glucose-Capillary: 122 mg/dL — ABNORMAL HIGH (ref 65–99)
Glucose-Capillary: 125 mg/dL — ABNORMAL HIGH (ref 65–99)

## 2017-06-12 LAB — PHOSPHORUS: Phosphorus: 3 mg/dL (ref 2.5–4.6)

## 2017-06-12 LAB — BASIC METABOLIC PANEL
Anion gap: 10 (ref 5–15)
BUN: 18 mg/dL (ref 6–20)
CO2: 25 mmol/L (ref 22–32)
Calcium: 8.9 mg/dL (ref 8.9–10.3)
Chloride: 103 mmol/L (ref 101–111)
Creatinine, Ser: 0.92 mg/dL (ref 0.61–1.24)
GFR calc Af Amer: >60 mL/min (ref >60)
GFR calc non Af Amer: >60 mL/min (ref >60)
Glucose, Bld: 110 mg/dL — ABNORMAL HIGH (ref 65–99)
Potassium: 3.6 mmol/L (ref 3.5–5.1)
Sodium: 138 mmol/L (ref 135–145)

## 2017-06-12 MED ORDER — TRACE MINERALS CR-CU-MN-SE-ZN 10-1000-500-60 MCG/ML IV SOLN
INTRAVENOUS | Status: AC
  Administered 2017-06-12: 17:00:00 via INTRAVENOUS
  Filled 2017-06-12: qty 960

## 2017-06-12 MED ORDER — FAT EMULSION PLANT BASED 20 % IV EMUL
240.0000 mL | INTRAVENOUS | Status: AC
  Administered 2017-06-12: 240 mL via INTRAVENOUS
  Filled 2017-06-12: qty 250

## 2017-06-12 MED ORDER — INSULIN ASPART 100 UNIT/ML ~~LOC~~ SOLN
0.0000 [IU] | Freq: Four times a day (QID) | SUBCUTANEOUS | Status: DC
  Administered 2017-06-12 – 2017-06-17 (×13): 1 [IU] via SUBCUTANEOUS

## 2017-06-12 MED ORDER — HYDRALAZINE HCL 20 MG/ML IJ SOLN
10.0000 mg | Freq: Four times a day (QID) | INTRAMUSCULAR | Status: DC | PRN
  Administered 2017-06-12 – 2017-06-14 (×5): 10 mg via INTRAVENOUS
  Filled 2017-06-12 (×5): qty 1

## 2017-06-12 MED ORDER — POTASSIUM CHLORIDE 10 MEQ/100ML IV SOLN
10.0000 meq | INTRAVENOUS | Status: AC
  Administered 2017-06-12 (×2): 10 meq via INTRAVENOUS
  Filled 2017-06-12 (×2): qty 100

## 2017-06-12 MED ORDER — MAGNESIUM SULFATE IN D5W 1-5 GM/100ML-% IV SOLN
1.0000 g | Freq: Once | INTRAVENOUS | Status: AC
  Administered 2017-06-12: 1 g via INTRAVENOUS
  Filled 2017-06-12: qty 100

## 2017-06-12 MED ORDER — LACTATED RINGERS IV SOLN
INTRAVENOUS | Status: AC
  Administered 2017-06-13: 18:00:00 via INTRAVENOUS

## 2017-06-12 NOTE — Progress Notes (Signed)
PROGRESS NOTE    Bryan Floyd  PXT:[id number] DOB: 1950/09/12 DOA: 06/05/2017 PCP: Harlan Stains, MD   Brief Narrative: Bryan Floyd is a 67 y.o. male with past medical history significant for colon polyps with high-grade dysplasia, accelerated hypertension, hyperlipidemia, BPH. He presented for surgery to remove colonic polyps. He is s/p subtotal colectomy w/ anastomosis. He developed uncontrolled hypertension and is currently nothing by mouth.   Assessment & Plan:   Active Problems:   Essential hypertension   Colon cancer ([name])   Essential hypertension Hypertensive urgency Complicated by NPO status. Improved with metoprolol -Continue metoprolol IV scheduled -Continue hydralazine IV prn SBP >160 or DBP >90 -On irbesartan, hydrochlorothiazide, hydralazine by mouth, but currently with NG tube  Colon cancer s/p resection Ileus Per primary  Acute blood loss anemia Secondary to recent surgery. Stable.  Leukocytosis Mild. Afebrile. -CBC in AM  Hyperlipidemia On Crestor as an outpatient. -restart Crestor once patient is taking by mouth  Obesity Body mass index is 36.88 kg/m.    DVT prophylaxis: Per primary Code Status:   Code Status: Full Code Family Communication: None at bedside Disposition Plan: Per primary   Procedures:   Subtotal colectomy w/ anastomosis  Antimicrobials:  Cefotetan (4/12)    Subjective: Not passing gas. No concerns today.  Objective: Vitals:   06/11/17 0358 06/11/17 1418 06/11/17 2150 06/12/17 0540  BP: 136/72 (!) 171/96 (!) 150/74 (!) 165/96  Pulse: 83 (!) 102 87 96  Resp: 17 16 18 18   Temp: 99.7 F (37.6 C)  98.3 F (36.8 C) 98.3 F (36.8 C)  TempSrc: Oral  Oral Oral  SpO2: 95% 100% 94% 94%  Weight: 117.4 kg (258 lb 13.1 oz)   116.6 kg (257 lb 0.9 oz)  Height:        Intake/Output Summary (Last 24 hours) at 06/12/2017 0939 Last data filed at 06/12/2017 0600 Gross per 24 hour  Intake 3075 ml  Output 800 ml    Net 2275 ml   Filed Weights   06/09/17 1927 06/11/17 0358 06/12/17 0540  Weight: 114.6 kg (252 lb 11.2 oz) 117.4 kg (258 lb 13.1 oz) 116.6 kg (257 lb 0.9 oz)    Examination:  General: Well appearing, no distress Respiratory: no labored breathing Neuro: alert, oriented Psych: normal mood/affect    Data Reviewed: I have personally reviewed following labs and imaging studies  CBC: Recent Labs  Lab 06/06/17 0404 06/07/17 0345 06/08/17 0409 06/09/17 0431 06/11/17 0815  WBC 6.3 8.8 7.8 8.8 11.3*  NEUTROABS  --   --   --  6.2 6.5  HGB 12.1* 10.4* 9.0* 10.2* 9.9*  HCT 35.4* 30.1* 26.7* 29.6* 28.5*  MCV 88.9 87.8 88.4 88.6 89.3  PLT 308 PLATELET CLUMPS NOTED ON SMEAR, UNABLE TO ESTIMATE 259 315 627*   Basic Metabolic Panel: Recent Labs  Lab 06/08/17 0409 06/09/17 0431 06/10/17 0430 06/11/17 0815 06/12/17 0428  NA 136 132* 135 139 138  K 3.5 3.5 3.5 3.3* 3.6  CL 100* 96* 98* 99* 103  CO2 26 21* 24 27 25   GLUCOSE 116* 125* 126* 113* 110*  BUN 12 12 15 19 18   CREATININE 0.90 0.82 0.94 0.98 0.92  CALCIUM 9.1 9.5 9.6 9.2 8.9  MG 1.7 1.7 2.2 2.0 2.0  PHOS 2.0* 2.8 2.6 3.0 3.0   GFR: Estimated Creatinine Clearance: 99.6 mL/min (by C-G formula based on SCr of 0.92 mg/dL). Liver Function Tests: No results for input(s): AST, ALT, ALKPHOS, BILITOT, PROT, ALBUMIN in the last 168 hours. No  results for input(s): LIPASE, AMYLASE in the last 168 hours. No results for input(s): AMMONIA in the last 168 hours. Coagulation Profile: No results for input(s): INR, PROTIME in the last 168 hours. Cardiac Enzymes: No results for input(s): CKTOTAL, CKMB, CKMBINDEX, TROPONINI in the last 168 hours. BNP (last 3 results) No results for input(s): PROBNP in the last 8760 hours. HbA1C: No results for input(s): HGBA1C in the last 72 hours. CBG: No results for input(s): GLUCAP in the last 168 hours. Lipid Profile: No results for input(s): CHOL, HDL, LDLCALC, TRIG, CHOLHDL, LDLDIRECT in the  last 72 hours. Thyroid Function Tests: No results for input(s): TSH, T4TOTAL, FREET4, T3FREE, THYROIDAB in the last 72 hours. Anemia Panel: No results for input(s): VITAMINB12, FOLATE, FERRITIN, TIBC, IRON, RETICCTPCT in the last 72 hours. Sepsis Labs: No results for input(s): PROCALCITON, LATICACIDVEN in the last 168 hours.  No results found for this or any previous visit (from the past 240 hour(s)).       Radiology Studies: [place] Ekg Site Rite  Result Date: 06/11/2017 If [facility] image not attached, placement could not be confirmed due to current cardiac rhythm.       Scheduled Meds: . acetaminophen  1,000 mg Oral Q6H  . amLODipine  10 mg Oral Daily  . brimonidine  1 drop Both Eyes BID   And  . timolol  1 drop Both Eyes BID  . finasteride  5 mg Oral Daily  . heparin injection (subcutaneous)  5,000 Units Subcutaneous Q8H  . hydrALAZINE  10 mg Oral TID  . hydrochlorothiazide  25 mg Oral Daily  . irbesartan  37.5 mg Oral Daily  . lactose free nutrition  237 mL Oral TID WC  . latanoprost  1 drop Both Eyes QHS  . metoCLOPramide (REGLAN) injection  10 mg Intravenous Q6H  . metoprolol tartrate  5 mg Intravenous Q8H  . pantoprazole sodium  40 mg Per Tube Daily  . rosuvastatin  5 mg Oral q1800  . saccharomyces boulardii  250 mg Oral BID  . tamsulosin  0.4 mg Oral QPC supper   Continuous Infusions: . chlorproMAZINE (THORAZINE) IV Stopped (06/12/17 0350)  . lactated ringers 1,000 mL (06/12/17 0544)     LOS: 7 days     Cordelia Poche, MD [facility] 06/12/2017, 9:39 AM Pager: [phone]  If 7PM-7AM, please contact night-coverage [email or web address] Password [facility] 06/12/2017, 9:39 AM

## 2017-06-12 NOTE — Progress Notes (Signed)
Subjective NGt remains bilious.  Hiccups improved with thorazine.  Has had some BMs, but still no flatus.  No n/v.   Objective: Vital signs in last 24 hours: Temp:  [98.3 F (36.8 C)] 98.3 F (36.8 C) (04/19 0540) Pulse Rate:  [87-102] 96 (04/19 0540) Resp:  [16-18] 18 (04/19 0540) BP: (150-171)/(74-96) 165/96 (04/19 0540) SpO2:  [94 %-100 %] 94 % (04/19 0540) Weight:  [116.6 kg (257 lb 0.9 oz)] 116.6 kg (257 lb 0.9 oz) (04/19 0540) Last BM Date: 06/11/17  Intake/Output from previous day: 04/18 0701 - 04/19 0700 In: 3075 [I.V.:3000; IV Piggyback:75] Out: 900 [Urine:350; Emesis/NG output:550] Intake/Output this shift: No intake/output data recorded.  Gen: NAD, comfortable CV: reg Pulm: Normal work of breathing Abd: Soft, NT, ND. Incisions c/d/i without erythema. Ext: SCDs in place  Lab Results: CBC  Recent Labs    06/11/17 0815  WBC 11.3*  HGB 9.9*  HCT 28.5*  PLT 436*   BMET Recent Labs    06/11/17 0815 06/12/17 0428  NA 139 138  K 3.3* 3.6  CL 99* 103  CO2 27 25  GLUCOSE 113* 110*  BUN 19 18  CREATININE 0.98 0.92  CALCIUM 9.2 8.9   Anti-infectives: Anti-infectives (From admission, onward)   Start     Dose/Rate Route Frequency Ordered Stop   06/05/17 0545  cefoTEtan in Dextrose 5% (CEFOTAN) 2-2.08 GM-%(50ML) IVPB    Note to Pharmacy:  Waldron Session   : cabinet override      06/05/17 0545 06/05/17 0750   06/05/17 0543  cefoTEtan in Dextrose 5% (CEFOTAN) IVPB 2 g     2 g Intravenous On call to O.R. 06/05/17 0543 06/05/17 0805   06/05/17 0543  neomycin (MYCIFRADIN) tablet 1,000 mg  Status:  Discontinued     1,000 mg Oral 3 times per day 06/05/17 0543 06/05/17 1422   06/05/17 0543  metroNIDAZOLE (FLAGYL) tablet 1,000 mg  Status:  Discontinued     1,000 mg Oral 3 times per day 06/05/17 0543 06/05/17 1422       Assessment/Plan: Patient Active Problem List   Diagnosis Date Noted  . Colon cancer ([place]) 06/05/2017  . Essential hypertension 03/02/2017   . Preop cardiovascular exam 03/02/2017  . Mixed dyslipidemia 03/02/2017  . Overweight 03/02/2017  . BPH (benign prostatic hyperplasia) 03/06/2016  . Colon polyps 03/06/2016  ASSESSMENT Expected postoperative ileus  s/p Procedure(s): LAPAROSCOPIC ASSISTED SUBTOTAL COLECTOMY WITH ILEO DESCENDING ANASTOMOSIS AND TAKEDOWN OF SPLENIC FLEXURE 06/05/2017 (POD#7)  No infectious issues.  -NPO, NGT,  -Labs OK, no electrolyte issues.  WBCs sl up. -Ambulate 5x/day TNA today. -PPx: SQH, SCDs -Dispo: Awaiting resolution of ileus   LOS: 7 days   [facility]

## 2017-06-12 NOTE — Progress Notes (Signed)
Initial Nutrition Assessment  DOCUMENTATION CODES:   Obesity unspecified  INTERVENTION:   - TPN management per pharmacy  NUTRITION DIAGNOSIS:   Inadequate oral intake related to inability to eat as evidenced by NPO status.  GOAL:   Patient will meet greater than or equal to 90% of their needs  MONITOR:   Diet advancement, Other (Comment), I & O's, Weight trends, Skin(TPN)  REASON FOR ASSESSMENT:   Consult New TPN/TNA  ASSESSMENT:   67 year old male who was admitted s/p laparoscopic subtotal colectomy with ileocolic anastomosis. PMH significant for colon polyps with high-grade dysplasia, accelerated hypertension, hyperlipidemia, and BPH. Consulted for new TPN given prolonged ileus.  06/05/17 - subtotal colectomy and takedown of splenic flexure 06/10/17 - abdominal x-ray revealed possible ileus, NGT to suction due to hiccups and emesis 06/11/17 - PICC placement 06/12/17 - TPN will start at 1800: Clinimix E 5/15 @ 40 ml/hr and 20% fat emulsion at 20 ml/hr x 12 hours  NGT to suction at time of visit. Spoke with pt and family member at bedside. Pt reports having a good appetite PTA and eating 2 meals and snacks daily. Pt states that the last time he had anything by mouth was on Monday or Tuesday of this week. Pt's family member reports pt has not had anything solid to eat since Wednesday of last week.  Pt denies any recent weight loss. Per weight history in chart, pt's weight has fluctuated between 253-264 lbs over the past year.  Pt reports he is very active at home with mowing the lawn and walking for 45 minutes most days.  Medications reviewed and include: sliding scale Novolog, 10 mg Reglan QID, lactated ringers, IV magnesium sulfate, 10 mEq KCl x 2 today  Labs reviewed: hemoglobin 9.9 (L), HCT 28.5 (L)  NUTRITION - FOCUSED PHYSICAL EXAM:    Most Recent Value  Orbital Region  No depletion  Upper Arm Region  Mild depletion  Thoracic and Lumbar Region  No depletion   Buccal Region  No depletion  Temple Region  No depletion  Clavicle Bone Region  Mild depletion  Clavicle and Acromion Bone Region  Mild depletion  Scapular Bone Region  Unable to assess  Dorsal Hand  No depletion  Patellar Region  No depletion  Anterior Thigh Region  No depletion  Posterior Calf Region  No depletion  Edema (RD Assessment)  None  Hair  Reviewed  Eyes  Reviewed  Mouth  Reviewed  Skin  Reviewed  Nails  Reviewed       Diet Order:  Diet NPO time specified TPN (CLINIMIX-E) Adult  EDUCATION NEEDS:   Education needs have been addressed  Skin:  Skin Assessment: Skin Integrity Issues: Skin Integrity Issues:: Incisions Incisions: abdomen, perineum  Last BM:  06/11/17 small type 7  Height:   Ht Readings from Last 1 Encounters:  06/05/17 5\' 10"  (1.778 m)    Weight:   Wt Readings from Last 1 Encounters:  06/12/17 257 lb 0.9 oz (116.6 kg)    Ideal Body Weight:  75.5 kg  BMI:  Body mass index is 36.88 kg/m.  Estimated Nutritional Needs:   Kcal:  2200-2400 kcal/day (MSJ x 1.2-1.3)  Protein:  100-115 grams/day  Fluid:  >/= 2.2 L/day    Gaynell Face, MS, RD, LDN Pager: [phone] Weekend/After Hours: [phone]

## 2017-06-12 NOTE — Progress Notes (Signed)
[place] NOTE   Pharmacy Consult for TPN Indication: prolonged ileus  Patient Measurements: Height: 5\' 10"  (177.8 cm) Weight: 257 lb 0.9 oz (116.6 kg) IBW/kg (Calculated) : 73 TPN AdjBW (KG): 84.1 Body mass index is 36.88 kg/m. Usual Weight:   Insulin Requirements: not on insulin  Current Nutrition: NPO - boost plus 237 ml tid started on 4/15 but not been taking d/t NPO status (last dose given on 4/16)  IVF: LR at 125 ml/hr  Central access: PICC placed on 4/18 TPN start date: 4/19  ASSESSMENT                                                                                                          HPI: Patient is a 67 y.o M with hx multiple unresectable colon polyps with high grade dysplasia admitted to [facility] on 06/05/17 for lap subtotal colectomy and takedown of splenic flexure.  To start TPN for prolonged ileus.  Significant events:  - 4/12: subtotal colectomy and takedown of splenic flexure - 4/17: NGT d/t hiccups and emesis  Today:    Glucose (goal <150): cbgs with bmet wnl; no hx DM  Electrolytes: K up 3.6 (s/p 4 runs kcl on 4/18), Mag 2; Ca, phos wnl       - goal K >4 and Mag >2 for ileus  Renal; scr wnl  LFTs: pending  TGs: pending  Prealbumin: pending  NUTRITIONAL GOALS                                                                                             RD recs: pending  Clinimix E 5/15 at a goal rate of 100 ml/hr + 20% fat emulsion at 20 ml/hr x 12 hrs to provide: 100 g/day protein, 2184 Kcal/day.  PLAN                                                                                                                          Now:  - magnesium sulfate 1 gm IV x1 - potassium chloride 69mEq IV x2 runs  At 1800 today:  Start Clinimix E 5/15 at 40 ml/hr.  20%  fat emulsion at 20 ml/hr x12 hrs  Plan to advance as tolerated to the goal rate.  TPN to contain standard multivitamins and trace elements.  Pt's  not consistently getting protonix via tube and due to national backorder of IV protonix-- will change to pepcid and add this to TPN  Reduce IVF to 80 ml/hr.  Add sensitive SSI q6h  Check daily CMET, phos, mag thru 4/23, then TPN lab panels on Mondays & Thursdays.  F/u daily.  Mayara Paulson P 06/12/2017,9:57 AM

## 2017-06-13 ENCOUNTER — Encounter (HOSPITAL_COMMUNITY): Payer: Self-pay | Admitting: *Deleted

## 2017-06-13 LAB — CBC
HCT: 28.2 % — ABNORMAL LOW (ref 39.0–52.0)
Hemoglobin: 9.6 g/dL — ABNORMAL LOW (ref 13.0–17.0)
MCH: 31 pg (ref 26.0–34.0)
MCHC: 34 g/dL (ref 30.0–36.0)
MCV: 91 fL (ref 78.0–100.0)
Platelets: 488 10*3/uL — ABNORMAL HIGH (ref 150–400)
RBC: 3.1 MIL/uL — ABNORMAL LOW (ref 4.22–5.81)
RDW: 16.7 % — ABNORMAL HIGH (ref 11.5–15.5)
WBC: 10.4 10*3/uL (ref 4.0–10.5)

## 2017-06-13 LAB — COMPREHENSIVE METABOLIC PANEL
ALT: 26 U/L (ref 17–63)
AST: 24 U/L (ref 15–41)
Albumin: 3.4 g/dL — ABNORMAL LOW (ref 3.5–5.0)
Alkaline Phosphatase: 51 U/L (ref 38–126)
Anion gap: 9 (ref 5–15)
BUN: 15 mg/dL (ref 6–20)
CO2: 23 mmol/L (ref 22–32)
Calcium: 9 mg/dL (ref 8.9–10.3)
Chloride: 109 mmol/L (ref 101–111)
Creatinine, Ser: 0.86 mg/dL (ref 0.61–1.24)
GFR calc Af Amer: >60 mL/min (ref >60)
GFR calc non Af Amer: >60 mL/min (ref >60)
Glucose, Bld: 124 mg/dL — ABNORMAL HIGH (ref 65–99)
Potassium: 3.5 mmol/L (ref 3.5–5.1)
Sodium: 141 mmol/L (ref 135–145)
Total Bilirubin: 1 mg/dL (ref 0.3–1.2)
Total Protein: 6.7 g/dL (ref 6.5–8.1)

## 2017-06-13 LAB — GLUCOSE, CAPILLARY
Glucose-Capillary: 117 mg/dL — ABNORMAL HIGH (ref 65–99)
Glucose-Capillary: 119 mg/dL — ABNORMAL HIGH (ref 65–99)
Glucose-Capillary: 120 mg/dL — ABNORMAL HIGH (ref 65–99)
Glucose-Capillary: 130 mg/dL — ABNORMAL HIGH (ref 65–99)

## 2017-06-13 LAB — DIFFERENTIAL
Basophils Absolute: 0.1 10*3/uL (ref 0.0–0.1)
Basophils Relative: 1 %
Eosinophils Absolute: 1.3 10*3/uL — ABNORMAL HIGH (ref 0.0–0.7)
Eosinophils Relative: 13 %
Lymphocytes Relative: 13 %
Lymphs Abs: 1.3 10*3/uL (ref 0.7–4.0)
Monocytes Absolute: 1.3 10*3/uL — ABNORMAL HIGH (ref 0.1–1.0)
Monocytes Relative: 13 %
Neutro Abs: 6.4 10*3/uL (ref 1.7–7.7)
Neutrophils Relative %: 60 %

## 2017-06-13 LAB — PHOSPHORUS: Phosphorus: 2.7 mg/dL (ref 2.5–4.6)

## 2017-06-13 LAB — TRIGLYCERIDES: Triglycerides: 140 mg/dL (ref <150)

## 2017-06-13 LAB — MAGNESIUM: Magnesium: 2.1 mg/dL (ref 1.7–2.4)

## 2017-06-13 LAB — PREALBUMIN: Prealbumin: 14.6 mg/dL — ABNORMAL LOW (ref 18–38)

## 2017-06-13 MED ORDER — FAT EMULSION PLANT BASED 20 % IV EMUL
240.0000 mL | INTRAVENOUS | Status: AC
  Administered 2017-06-13: 240 mL via INTRAVENOUS
  Filled 2017-06-13: qty 250

## 2017-06-13 MED ORDER — POTASSIUM CHLORIDE 10 MEQ/100ML IV SOLN
10.0000 meq | INTRAVENOUS | Status: AC
  Administered 2017-06-13 (×4): 10 meq via INTRAVENOUS
  Filled 2017-06-13 (×4): qty 100

## 2017-06-13 MED ORDER — LACTATED RINGERS IV SOLN: INTRAVENOUS | Status: AC

## 2017-06-13 MED ORDER — TRACE MINERALS CR-CU-MN-SE-ZN 10-1000-500-60 MCG/ML IV SOLN
INTRAVENOUS | Status: AC
  Administered 2017-06-13: 18:00:00 via INTRAVENOUS
  Filled 2017-06-13: qty 1200

## 2017-06-13 NOTE — Progress Notes (Signed)
[place] NOTE   Pharmacy Consult for TPN Indication: prolonged ileus  Patient Measurements: Height: 5\' 10"  (177.8 cm) Weight: 257 lb 11.5 oz (116.9 kg) IBW/kg (Calculated) : 73 TPN AdjBW (KG): 84.1 Body mass index is 36.98 kg/m.   Insulin Requirements: 2 units SSI Novolog in past 24 hours  Current Nutrition: NPO, has NG tube in place   IVF: LR at 80 ml/hr  Central access: PICC placed on 4/18 TPN start date: 4/19  ASSESSMENT                                                                                                          HPI: Patient is a 67 y.o M with hx multiple unresectable colon polyps with high grade dysplasia admitted to [facility] on 06/05/17 for lap subtotal colectomy and takedown of splenic flexure.  To start TPN for prolonged ileus.  Significant events:  - 4/12: subtotal colectomy and takedown of splenic flexure - 4/17: NGT d/t hiccups and emesis  Recent Labs    06/12/17 0428 06/13/17 0407  NA 138 141  K 3.6 3.5  CL 103 109  CO2 25 23  GLUCOSE 110* 124*  BUN 18 15  CREATININE 0.92 0.86  CALCIUM 8.9 9.0  PHOS 3.0 2.7  MG 2.0 2.1  ALBUMIN  --  3.4*  ALKPHOS  --  51  AST  --  24  ALT  --  26  BILITOT  --  1.0  TRIG  --  140  4/20 Corr Ca: 9.5  Today:    Glucose (goal <150): CBGs at goal  Electrolytes: K borderline low despite receiving 2 runs KCl 81mEq yesterday; others WNL  Renal: SCr WNL  LFTs: all below ULN  TGs: WNL  Prealbumin: collected today, result pending  NUTRITIONAL GOALS                                                                                             RD recs: 2200 - 2400 KCal/day, 100 -115 grams protein/day  Clinimix E 5/15 at a goal rate of 83 ml/hr + 20% fat emulsion at 20 ml/hr x 12 hrs to provide: 100 g/day protein,  1900 Kcal/day.  PLAN  Now:  -  potassium chloride 5mEq IV x4 runs  At 1800 today:  Increase Clinimix E 5/15 to 50 ml/hr.  20% fat emulsion at 20 ml/hr x12 hrs  Plan to advance Clinimix as tolerated to the goal rate.  TPN to contain standard multivitamins and trace elements.  Pt was not consistently getting Protonix via tube and due to national backorder of IV Protonix was converted to Pepcid - was placed in TPN 4/19   Reduce IVF to 70 ml/hr.  Continue sensitive SSI Novolog q6h  Check daily CMET, phos, mag thru 4/23, then TPN lab panels on Mondays & Thursdays.  F/u daily.  Clayburn Pert, PharmD, BCPS [phone] 06/13/2017  8:28 AM

## 2017-06-13 NOTE — Progress Notes (Addendum)
Subjective Allowed small amt ice chips yesterday.  No n/v.  Some hiccups again.  Small BM again, but no flatus.     Objective: Vital signs in last 24 hours: Temp:  [98.8 F (37.1 C)-100.3 F (37.9 C)] 98.8 F (37.1 C) (04/20 0453) Pulse Rate:  [90-105] 97 (04/20 0453) Resp:  [16-18] 16 (04/20 0453) BP: (149-204)/(67-97) 172/74 (04/20 0610) SpO2:  [94 %-100 %] 95 % (04/20 0453) Weight:  [116.9 kg (257 lb 11.5 oz)] 116.9 kg (257 lb 11.5 oz) (04/20 0500) Last BM Date: 06/11/17  Intake/Output from previous day: 04/19 0701 - 04/20 0700 In: 3196.1 [P.O.:360; I.V.:2611.1; IV Piggyback:225] Out: 500 [Emesis/NG output:500] Intake/Output this shift: No intake/output data recorded.  Gen: NAD, comfortable CV: reg rate and rhythm Pulm: Normal work of breathing, CTAB.  Decreased at bases Abd: Soft, NT, ND. Incisions c/d/i without erythema.  Minimal BS. Ext: SCDs in place  Lab Results: CBC  Recent Labs    06/11/17 0815 06/13/17 0407  WBC 11.3* 10.4  HGB 9.9* 9.6*  HCT 28.5* 28.2*  PLT 436* 488*   BMET Recent Labs    06/12/17 0428 06/13/17 0407  NA 138 141  K 3.6 3.5  CL 103 109  CO2 25 23  GLUCOSE 110* 124*  BUN 18 15  CREATININE 0.92 0.86  CALCIUM 8.9 9.0   Anti-infectives: Anti-infectives (From admission, onward)   Start     Dose/Rate Route Frequency Ordered Stop   06/05/17 0545  cefoTEtan in Dextrose 5% (CEFOTAN) 2-2.08 GM-%(50ML) IVPB    Note to Pharmacy:  Waldron Session   : cabinet override      06/05/17 0545 06/05/17 0750   06/05/17 0543  cefoTEtan in Dextrose 5% (CEFOTAN) IVPB 2 g     2 g Intravenous On call to O.R. 06/05/17 0543 06/05/17 0805   06/05/17 0543  neomycin (MYCIFRADIN) tablet 1,000 mg  Status:  Discontinued     1,000 mg Oral 3 times per day 06/05/17 0543 06/05/17 1422   06/05/17 0543  metroNIDAZOLE (FLAGYL) tablet 1,000 mg  Status:  Discontinued     1,000 mg Oral 3 times per day 06/05/17 0543 06/05/17 1422       Assessment/Plan: Patient  Active Problem List   Diagnosis Date Noted  . Colon cancer ([place]) 06/05/2017  . Essential hypertension 03/02/2017  . Preop cardiovascular exam 03/02/2017  . Mixed dyslipidemia 03/02/2017  . Overweight 03/02/2017  . BPH (benign prostatic hyperplasia) 03/06/2016  . Colon polyps 03/06/2016  ASSESSMENT Expected postoperative ileus  s/p Procedure(s): LAPAROSCOPIC ASSISTED SUBTOTAL COLECTOMY WITH ILEO DESCENDING ANASTOMOSIS AND TAKEDOWN OF SPLENIC FLEXURE 06/05/2017 (POD#7)  No infectious issues.  -NPO x ice chips, NGT -Labs OK, no electrolyte issues.  WBCs back down. -Ambulate 5x/day TNA -PPx: SQH, SCDs -Dispo: Awaiting resolution of ileus   LOS: 8 days   [facility]

## 2017-06-13 NOTE — Progress Notes (Addendum)
PROGRESS NOTE    Bryan Floyd  OZH:[id number] DOB: January 07, 1951 DOA: 06/05/2017 PCP: Harlan Stains, MD   Brief Narrative: Bryan Floyd is a 67 y.o. male with past medical history significant for colon polyps with high-grade dysplasia, accelerated hypertension, hyperlipidemia, BPH. He presented for surgery to remove colonic polyps. He is s/p subtotal colectomy w/ anastomosis. He developed uncontrolled hypertension and is currently nothing by mouth.   Assessment & Plan:   Active Problems:   Essential hypertension   Colon cancer ([place])   Essential hypertension Hypertensive urgency Complicated by NPO status. Uncontrolled. -Continue metoprolol IV scheduled -Continue hydralazine IV prn SBP >160 or DBP >90 (change discussed with nursing) -On irbesartan, hydrochlorothiazide, hydralazine by mouth, but currently with NG tube and NPO -May need to consider clonidine patch. Would like to avoid needing titratable infusion.  Colon cancer s/p resection Ileus Per primary  Acute blood loss anemia Secondary to recent surgery. Stable.  Leukocytosis Mild. Afebrile. Resolved.  Hyperlipidemia On Crestor as an outpatient. -restart Crestor once patient is taking by mouth  Obesity Body mass index is 36.98 kg/m.    DVT prophylaxis: Per primary Code Status:   Code Status: Full Code Family Communication: None at bedside Disposition Plan: Per primary   Procedures:   Subtotal colectomy w/ anastomosis  Antimicrobials:  Cefotetan (4/12)    Subjective: Ambulating  Objective: Vitals:   06/13/17 0500 06/13/17 0610 06/13/17 0930 06/13/17 1133  BP:  (!) 172/74 (!) 174/78 (!) 177/98  Pulse:   84 (!) 105  Resp:    18  Temp:    99.5 F (37.5 C)  TempSrc:    Oral  SpO2:    97%  Weight: 116.9 kg (257 lb 11.5 oz)     Height:        Intake/Output Summary (Last 24 hours) at 06/13/2017 1208 Last data filed at 06/13/2017 1122 Gross per 24 hour  Intake 3196.09 ml  Output 600 ml    Net 2596.09 ml   Filed Weights   06/11/17 0358 06/12/17 0540 06/13/17 0500  Weight: 117.4 kg (258 lb 13.1 oz) 116.6 kg (257 lb 0.9 oz) 116.9 kg (257 lb 11.5 oz)    Examination:  General: Well appearing, no distress Respiratory: no labored breathing Neuro: alert, oriented, ambulating with slowed but otherwise normal gait. Psych: normal mood/affect    Data Reviewed: I have personally reviewed following labs and imaging studies  CBC: Recent Labs  Lab 06/07/17 0345 06/08/17 0409 06/09/17 0431 06/11/17 0815 06/13/17 0407  WBC 8.8 7.8 8.8 11.3* 10.4  NEUTROABS  --   --  6.2 6.5 6.4  HGB 10.4* 9.0* 10.2* 9.9* 9.6*  HCT 30.1* 26.7* 29.6* 28.5* 28.2*  MCV 87.8 88.4 88.6 89.3 91.0  PLT PLATELET CLUMPS NOTED ON SMEAR, UNABLE TO ESTIMATE 259 315 436* 629*   Basic Metabolic Panel: Recent Labs  Lab 06/09/17 0431 06/10/17 0430 06/11/17 0815 06/12/17 0428 06/13/17 0407  NA 132* 135 139 138 141  K 3.5 3.5 3.3* 3.6 3.5  CL 96* 98* 99* 103 109  CO2 21* 24 27 25 23   GLUCOSE 125* 126* 113* 110* 124*  BUN 12 15 19 18 15   CREATININE 0.82 0.94 0.98 0.92 0.86  CALCIUM 9.5 9.6 9.2 8.9 9.0  MG 1.7 2.2 2.0 2.0 2.1  PHOS 2.8 2.6 3.0 3.0 2.7   GFR: Estimated Creatinine Clearance: 106.8 mL/min (by C-G formula based on SCr of 0.86 mg/dL). Liver Function Tests: Recent Labs  Lab 06/13/17 0407  AST 24  ALT 26  ALKPHOS 51  BILITOT 1.0  PROT 6.7  ALBUMIN 3.4*   No results for input(s): LIPASE, AMYLASE in the last 168 hours. No results for input(s): AMMONIA in the last 168 hours. Coagulation Profile: No results for input(s): INR, PROTIME in the last 168 hours. Cardiac Enzymes: No results for input(s): CKTOTAL, CKMB, CKMBINDEX, TROPONINI in the last 168 hours. BNP (last 3 results) No results for input(s): PROBNP in the last 8760 hours. HbA1C: No results for input(s): HGBA1C in the last 72 hours. CBG: Recent Labs  Lab 06/12/17 1738 06/12/17 2332 06/13/17 0605  GLUCAP 122*  125* 117*   Lipid Profile: Recent Labs    06/13/17 0407  TRIG 140   Thyroid Function Tests: No results for input(s): TSH, T4TOTAL, FREET4, T3FREE, THYROIDAB in the last 72 hours. Anemia Panel: No results for input(s): VITAMINB12, FOLATE, FERRITIN, TIBC, IRON, RETICCTPCT in the last 72 hours. Sepsis Labs: No results for input(s): PROCALCITON, LATICACIDVEN in the last 168 hours.  No results found for this or any previous visit (from the past 240 hour(s)).       Radiology Studies: No results found.      Scheduled Meds: . acetaminophen  1,000 mg Oral Q6H  . amLODipine  10 mg Oral Daily  . brimonidine  1 drop Both Eyes BID   And  . timolol  1 drop Both Eyes BID  . finasteride  5 mg Oral Daily  . heparin injection (subcutaneous)  5,000 Units Subcutaneous Q8H  . hydrALAZINE  10 mg Oral TID  . hydrochlorothiazide  25 mg Oral Daily  . insulin aspart  0-9 Units Subcutaneous Q6H  . irbesartan  37.5 mg Oral Daily  . latanoprost  1 drop Both Eyes QHS  . metoCLOPramide (REGLAN) injection  10 mg Intravenous Q6H  . metoprolol tartrate  5 mg Intravenous Q8H  . rosuvastatin  5 mg Oral q1800  . saccharomyces boulardii  250 mg Oral BID  . tamsulosin  0.4 mg Oral QPC supper   Continuous Infusions: . Marland KitchenTPN (CLINIMIX-E) Adult     And  . Fat emulsion    . chlorproMAZINE (THORAZINE) IV 12.5 mg (06/13/17 5409)  . lactated ringers 80 mL/hr at 06/12/17 1722  . lactated ringers    . potassium chloride 10 mEq (06/13/17 1147)  . Marland KitchenTPN (CLINIMIX-E) Adult 40 mL/hr at 06/12/17 1705     LOS: 8 days     Cordelia Poche, MD [facility] 06/13/2017, 12:08 PM Pager: [phone]  If 7PM-7AM, please contact night-coverage [email or web address] Password [facility] 06/13/2017, 12:08 PM

## 2017-06-14 LAB — COMPREHENSIVE METABOLIC PANEL
ALT: 41 U/L (ref 17–63)
AST: 37 U/L (ref 15–41)
Albumin: 3.4 g/dL — ABNORMAL LOW (ref 3.5–5.0)
Alkaline Phosphatase: 58 U/L (ref 38–126)
Anion gap: 8 (ref 5–15)
BUN: 12 mg/dL (ref 6–20)
CO2: 22 mmol/L (ref 22–32)
Calcium: 9.2 mg/dL (ref 8.9–10.3)
Chloride: 110 mmol/L (ref 101–111)
Creatinine, Ser: 0.84 mg/dL (ref 0.61–1.24)
GFR calc Af Amer: >60 mL/min (ref >60)
GFR calc non Af Amer: >60 mL/min (ref >60)
Glucose, Bld: 136 mg/dL — ABNORMAL HIGH (ref 65–99)
Potassium: 3.8 mmol/L (ref 3.5–5.1)
Sodium: 140 mmol/L (ref 135–145)
Total Bilirubin: 1.8 mg/dL — ABNORMAL HIGH (ref 0.3–1.2)
Total Protein: 6.9 g/dL (ref 6.5–8.1)

## 2017-06-14 LAB — GLUCOSE, CAPILLARY
Glucose-Capillary: 109 mg/dL — ABNORMAL HIGH (ref 65–99)
Glucose-Capillary: 124 mg/dL — ABNORMAL HIGH (ref 65–99)
Glucose-Capillary: 133 mg/dL — ABNORMAL HIGH (ref 65–99)

## 2017-06-14 LAB — MAGNESIUM: Magnesium: 2.1 mg/dL (ref 1.7–2.4)

## 2017-06-14 LAB — PHOSPHORUS: Phosphorus: 3 mg/dL (ref 2.5–4.6)

## 2017-06-14 MED ORDER — FAT EMULSION PLANT BASED 20 % IV EMUL
240.0000 mL | INTRAVENOUS | Status: AC
  Administered 2017-06-14: 240 mL via INTRAVENOUS
  Filled 2017-06-14: qty 250

## 2017-06-14 MED ORDER — LACTATED RINGERS IV SOLN
INTRAVENOUS | Status: AC
  Administered 2017-06-14: 50 mL/h via INTRAVENOUS

## 2017-06-14 MED ORDER — AMLODIPINE BESYLATE 10 MG PO TABS
10.0000 mg | ORAL_TABLET | Freq: Every day | ORAL | Status: DC
  Administered 2017-06-15 – 2017-06-19 (×5): 10 mg via ORAL
  Filled 2017-06-14 (×5): qty 1

## 2017-06-14 MED ORDER — FAT EMULSION PLANT BASED 20 % IV EMUL: 240.0000 mL | INTRAVENOUS | Status: DC

## 2017-06-14 MED ORDER — TRACE MINERALS CR-CU-MN-SE-ZN 10-1000-500-60 MCG/ML IV SOLN: INTRAVENOUS | Status: DC

## 2017-06-14 MED ORDER — CLINIMIX E/DEXTROSE (5/15) 5 % IV SOLN
INTRAVENOUS | Status: AC
  Administered 2017-06-14: 18:00:00 via INTRAVENOUS
  Filled 2017-06-14: qty 1680

## 2017-06-14 NOTE — Progress Notes (Signed)
[place] NOTE   Pharmacy Consult for TPN Indication: prolonged ileus  Patient Measurements: Height: 5\' 10"  (177.8 cm) Weight: 258 lb 13.1 oz (117.4 kg) IBW/kg (Calculated) : 73 TPN AdjBW (KG): 84.1 Body mass index is 37.14 kg/m.   Insulin Requirements: 1 units SSI Novolog in past 24 hours  Current Nutrition: NPO except ice chips, has NG tube in place   IVF: LR at 70 ml/hr  Central access: PICC placed on 4/18 TPN start date: 4/19  ASSESSMENT                                                                                                          HPI: Patient is a 67 y.o M with hx multiple unresectable colon polyps with high grade dysplasia admitted to [facility] on 06/05/17 for lap subtotal colectomy and takedown of splenic flexure.  To start TPN for prolonged ileus.  Significant events:  - 4/12: subtotal colectomy and takedown of splenic flexure - 4/17: NGT d/t hiccups and emesis  Recent Labs    06/13/17 0407 06/14/17 0552  NA 141 140  K 3.5 3.8  CL 109 110  CO2 23 22  GLUCOSE 124* 136*  BUN 15 12  CREATININE 0.86 0.84  CALCIUM 9.0 9.2  PHOS 2.7 3.0  MG 2.1 2.1  ALBUMIN 3.4* 3.4*  ALKPHOS 51 58  AST 24 37  ALT 26 41  BILITOT 1.0 1.8*  TRIG 140  --   PREALBUMIN 14.6*  --   4/20 Corr Ca: 9.5  Today:    Glucose (goal <150): CBGs at goal  Electrolytes: K within goal after runs KCl 80mEq yesterday; others WNL  Renal: SCr WNL  LFTs: all below ULN, tbili elevated at 1.8  TGs: WNL 140 (4/20)  Prealbumin: low at 14.6 (4/20)  NUTRITIONAL GOALS                                                                                             RD recs: 2200 - 2400 KCal/day, 100 -115 grams protein/day  Clinimix E 5/15 at a goal rate of 83 ml/hr + 20% fat emulsion at 20 ml/hr x 12 hrs to provide: 100 g/day protein,  1900 Kcal/day.  PLAN  At 1800 today:  Increase Clinimix E 5/15 to 70 ml/hr.  20% fat emulsion at 20 ml/hr x12 hrs  Plan to advance Clinimix as tolerated to the goal rate.  TPN to contain standard multivitamins and trace elements.  Pt was not consistently getting Protonix via tube and due to national backorder of IV Protonix was converted to Pepcid - was placed in TPN 4/19   Reduce IVF to 50 ml/hr.  Continue sensitive SSI Novolog q6h  Check daily CMET, phos, mag thru 4/23, then TPN lab panels on Mondays & Thursdays.  F/u daily.  Peggyann Juba, PharmD, BCPS Pager: [phone] 06/14/2017  7:16 AM

## 2017-06-14 NOTE — Progress Notes (Signed)
PROGRESS NOTE    Bryan Floyd  BZJ:[id number] DOB: [birth year] DOA: 06/05/2017 PCP: Harlan Stains, MD   Brief Narrative: Bryan Floyd is a 67 y.o. male with past medical history significant for colon polyps with high-grade dysplasia, accelerated hypertension, hyperlipidemia, BPH. He presented for surgery to remove colonic polyps. He is s/p subtotal colectomy w/ anastomosis. He developed uncontrolled hypertension and is currently nothing by mouth.   Assessment & Plan:   Active Problems:   Essential hypertension   Colon cancer ([place])   Essential hypertension Hypertensive urgency Complicated by NPO status. Uncontrolled. Clears today per general surgery -Discontinue metoprolol IV scheduled -Continue hydralazine IV; prn SBP;>160 or DBP >90 (change discussed with nursing). If improvement of blood pressure today with oral regimen, will liberalize parameters -On irbesartan, hydrochlorothiazide, hydralazine by mouth, but currently with NG tube and NPO -May need to consider clonidine patch. Would like to avoid needing titratable infusion. Patient now able to take PO, so will hold with regard to clonidine.  Colon cancer s/p resection Ileus Per primary  Acute blood loss anemia Secondary to recent surgery. Stable.  Leukocytosis Mild. Afebrile. Resolved.  Hyperlipidemia On Crestor as an outpatient. -restart Crestor once patient is taking by mouth  Obesity Body mass index is 37.14 kg/m.    DVT prophylaxis: Per primary Code Status:   Code Status: Full Code Family Communication: None at bedside Disposition Plan: Per primary   Procedures:   Subtotal colectomy w/ anastomosis  Antimicrobials:  Cefotetan (4/12)    Subjective: No flatus. No abdominal pain. No nausea/vomiting.  Objective: Vitals:   06/14/17 0441 06/14/17 0442 06/14/17 0855 06/14/17 1129  BP:  (!) 162/78 (!) 156/96 (!) 153/78  Pulse:  89 92 97  Resp:  14    Temp:  98.9 F (37.2 C)    TempSrc:   Oral    SpO2:  99%    Weight: 117.4 kg (258 lb 13.1 oz)     Height:        Intake/Output Summary (Last 24 hours) at 06/14/2017 1138 Last data filed at 06/14/2017 0441 Gross per 24 hour  Intake 1764.83 ml  Output 600 ml  Net 1164.83 ml   Filed Weights   06/12/17 0540 06/13/17 0500 06/14/17 0441  Weight: 116.6 kg (257 lb 0.9 oz) 116.9 kg (257 lb 11.5 oz) 117.4 kg (258 lb 13.1 oz)    Examination:  General: Well appearing, no distress Respiratory: Clear to auscultation bilaterally. Unlabored work of breathing. No wheezing or rales. Cardiovascular: Regular rate and rhythm. Normal S1 and S2. No heart murmurs present. No extra heart sounds Gastro: Soft, non-tender, distended, no guarding, no rebound, no masses felt. No bowel sounds heard Neuro: Alert, oriented Psych: normal mood, affect, judgement/insight    Data Reviewed: I have personally reviewed following labs and imaging studies  CBC: Recent Labs  Lab 06/08/17 0409 06/09/17 0431 06/11/17 0815 06/13/17 0407  WBC 7.8 8.8 11.3* 10.4  NEUTROABS  --  6.2 6.5 6.4  HGB 9.0* 10.2* 9.9* 9.6*  HCT 26.7* 29.6* 28.5* 28.2*  MCV 88.4 88.6 89.3 91.0  PLT 259 315 436* 017*   Basic Metabolic Panel: Recent Labs  Lab 06/10/17 0430 06/11/17 0815 06/12/17 0428 06/13/17 0407 06/14/17 0552  NA 135 139 138 141 140  K 3.5 3.3* 3.6 3.5 3.8  CL 98* 99* 103 109 110  CO2 24 27 25 23 22   GLUCOSE 126* 113* 110* 124* 136*  BUN 15 19 18 15 12   CREATININE 0.94 0.98 0.92 0.86 0.84  CALCIUM 9.6 9.2 8.9 9.0 9.2  MG 2.2 2.0 2.0 2.1 2.1  PHOS 2.6 3.0 3.0 2.7 3.0   GFR: Estimated Creatinine Clearance: 109.6 mL/min (by C-G formula based on SCr of 0.84 mg/dL). Liver Function Tests: Recent Labs  Lab 06/13/17 0407 06/14/17 0552  AST 24 37  ALT 26 41  ALKPHOS 51 58  BILITOT 1.0 1.8*  PROT 6.7 6.9  ALBUMIN 3.4* 3.4*   No results for input(s): LIPASE, AMYLASE in the last 168 hours. No results for input(s): AMMONIA in the last 168  hours. Coagulation Profile: No results for input(s): INR, PROTIME in the last 168 hours. Cardiac Enzymes: No results for input(s): CKTOTAL, CKMB, CKMBINDEX, TROPONINI in the last 168 hours. BNP (last 3 results) No results for input(s): PROBNP in the last 8760 hours. HbA1C: No results for input(s): HGBA1C in the last 72 hours. CBG: Recent Labs  Lab 06/13/17 0605 06/13/17 1207 06/13/17 1802 06/13/17 2332 06/14/17 0551  GLUCAP 117* 130* 120* 119* 109*   Lipid Profile: Recent Labs    06/13/17 0407  TRIG 140   Thyroid Function Tests: No results for input(s): TSH, T4TOTAL, FREET4, T3FREE, THYROIDAB in the last 72 hours. Anemia Panel: No results for input(s): VITAMINB12, FOLATE, FERRITIN, TIBC, IRON, RETICCTPCT in the last 72 hours. Sepsis Labs: No results for input(s): PROCALCITON, LATICACIDVEN in the last 168 hours.  No results found for this or any previous visit (from the past 240 hour(s)).       Radiology Studies: No results found.      Scheduled Meds: . acetaminophen  1,000 mg Oral Q6H  . [START ON 06/15/2017] amLODipine  10 mg Oral Daily  . brimonidine  1 drop Both Eyes BID   And  . timolol  1 drop Both Eyes BID  . finasteride  5 mg Oral Daily  . heparin injection (subcutaneous)  5,000 Units Subcutaneous Q8H  . hydrALAZINE  10 mg Oral TID  . hydrochlorothiazide  25 mg Oral Daily  . insulin aspart  0-9 Units Subcutaneous Q6H  . irbesartan  37.5 mg Oral Daily  . latanoprost  1 drop Both Eyes QHS  . metoCLOPramide (REGLAN) injection  10 mg Intravenous Q6H  . rosuvastatin  5 mg Oral q1800  . saccharomyces boulardii  250 mg Oral BID  . tamsulosin  0.4 mg Oral QPC supper   Continuous Infusions: . Marland KitchenTPN (CLINIMIX-E) Adult 50 mL/hr at 06/13/17 1802  . Marland KitchenTPN (CLINIMIX-E) Adult     And  . Fat emulsion    . chlorproMAZINE (THORAZINE) IV 12.5 mg (06/13/17 3335)  . lactated ringers Stopped (06/13/17 1900)  . lactated ringers       LOS: 9 days     Cordelia Poche, MD [facility] 06/14/2017, 11:38 AM Pager: [phone]  If 7PM-7AM, please contact night-coverage [email or web address] Password [facility] 06/14/2017, 11:38 AM

## 2017-06-14 NOTE — Progress Notes (Signed)
Subjective Still no flatus, but spent most of day yesterday with NGT clamped without n/v.  Minimal belching/hiccups.     Objective: Vital signs in last 24 hours: Temp:  [97.8 F (36.6 C)-99.5 F (37.5 C)] 98.9 F (37.2 C) (04/21 0442) Pulse Rate:  [89-112] 92 (04/21 0855) Resp:  [14-18] 14 (04/21 0442) BP: (156-177)/(72-98) 156/96 (04/21 0855) SpO2:  [97 %-100 %] 99 % (04/21 0442) Weight:  [117.4 kg (258 lb 13.1 oz)] 117.4 kg (258 lb 13.1 oz) (04/21 0441) Last BM Date: 06/13/17  Intake/Output from previous day: 04/20 0701 - 04/21 0700 In: 2404.8 [P.O.:30; I.V.:2349.8; IV Piggyback:25] Out: 700 [Emesis/NG output:700] Intake/Output this shift: No intake/output data recorded.  Gen: NAD, comfortable CV: reg rate  Pulm: Normal work of breathing Abd: Soft, NT, ND. Incisions c/d/i without erythema Ext: SCDs in place  Lab Results: CBC  Recent Labs    06/13/17 0407  WBC 10.4  HGB 9.6*  HCT 28.2*  PLT 488*   BMET Recent Labs    06/13/17 0407 06/14/17 0552  NA 141 140  K 3.5 3.8  CL 109 110  CO2 23 22  GLUCOSE 124* 136*  BUN 15 12  CREATININE 0.86 0.84  CALCIUM 9.0 9.2   Anti-infectives: Anti-infectives (From admission, onward)   Start     Dose/Rate Route Frequency Ordered Stop   06/05/17 0545  cefoTEtan in Dextrose 5% (CEFOTAN) 2-2.08 GM-%(50ML) IVPB    Note to Pharmacy:  [place] Session   : cabinet override      06/05/17 0545 06/05/17 0750   06/05/17 0543  cefoTEtan in Dextrose 5% (CEFOTAN) IVPB 2 g     2 g Intravenous On call to O.R. 06/05/17 0543 06/05/17 0805   06/05/17 0543  neomycin (MYCIFRADIN) tablet 1,000 mg  Status:  Discontinued     1,000 mg Oral 3 times per day 06/05/17 0543 06/05/17 1422   06/05/17 0543  metroNIDAZOLE (FLAGYL) tablet 1,000 mg  Status:  Discontinued     1,000 mg Oral 3 times per day 06/05/17 0543 06/05/17 1422       Assessment/Plan: Patient Active Problem List   Diagnosis Date Noted  . Colon cancer ([place]) 06/05/2017  .  Essential hypertension 03/02/2017  . Preop cardiovascular exam 03/02/2017  . Mixed dyslipidemia 03/02/2017  . Overweight 03/02/2017  . BPH (benign prostatic hyperplasia) 03/06/2016  . Colon polyps 03/06/2016  ASSESSMENT Expected postoperative ileus  s/p Procedure(s): LAPAROSCOPIC ASSISTED SUBTOTAL COLECTOMY WITH ILEO DESCENDING ANASTOMOSIS AND TAKEDOWN OF SPLENIC FLEXURE 06/05/2017 (POD#7)  No infectious issues.  -d/c NGT.  Allow clears today.  Concerned that still no flatus, but will do minimal po clears.   -Labs OK, no electrolyte issues.   -Ambulate 5x/day TNA -PPx: SQH, SCDs -Dispo: Awaiting resolution of ileus   LOS: 9 days   [facility]

## 2017-06-15 ENCOUNTER — Inpatient Hospital Stay (HOSPITAL_COMMUNITY): Payer: PPO

## 2017-06-15 DIAGNOSIS — R7401 Elevation of levels of liver transaminase levels: Secondary | ICD-10-CM

## 2017-06-15 DIAGNOSIS — E785 Hyperlipidemia, unspecified: Secondary | ICD-10-CM

## 2017-06-15 DIAGNOSIS — R740 Nonspecific elevation of levels of transaminase and lactic acid dehydrogenase [LDH]: Secondary | ICD-10-CM

## 2017-06-15 LAB — COMPREHENSIVE METABOLIC PANEL
ALT: 114 U/L — ABNORMAL HIGH (ref 17–63)
AST: 80 U/L — ABNORMAL HIGH (ref 15–41)
Albumin: 4 g/dL (ref 3.5–5.0)
Alkaline Phosphatase: 86 U/L (ref 38–126)
Anion gap: 10 (ref 5–15)
BUN: 15 mg/dL (ref 6–20)
CO2: 22 mmol/L (ref 22–32)
Calcium: 9.7 mg/dL (ref 8.9–10.3)
Chloride: 107 mmol/L (ref 101–111)
Creatinine, Ser: 0.88 mg/dL (ref 0.61–1.24)
GFR calc Af Amer: >60 mL/min (ref >60)
GFR calc non Af Amer: >60 mL/min (ref >60)
Glucose, Bld: 163 mg/dL — ABNORMAL HIGH (ref 65–99)
Potassium: 3.5 mmol/L (ref 3.5–5.1)
Sodium: 139 mmol/L (ref 135–145)
Total Bilirubin: 1.7 mg/dL — ABNORMAL HIGH (ref 0.3–1.2)
Total Protein: 8.1 g/dL (ref 6.5–8.1)

## 2017-06-15 LAB — DIFFERENTIAL
Basophils Absolute: 0 10*3/uL (ref 0.0–0.1)
Basophils Relative: 0 %
Eosinophils Absolute: 0.9 10*3/uL — ABNORMAL HIGH (ref 0.0–0.7)
Eosinophils Relative: 11 %
Lymphocytes Relative: 11 %
Lymphs Abs: 0.9 10*3/uL (ref 0.7–4.0)
Monocytes Absolute: 0.8 10*3/uL (ref 0.1–1.0)
Monocytes Relative: 9 %
Neutro Abs: 5.8 10*3/uL (ref 1.7–7.7)
Neutrophils Relative %: 69 %

## 2017-06-15 LAB — GLUCOSE, CAPILLARY
Glucose-Capillary: 119 mg/dL — ABNORMAL HIGH (ref 65–99)
Glucose-Capillary: 130 mg/dL — ABNORMAL HIGH (ref 65–99)
Glucose-Capillary: 137 mg/dL — ABNORMAL HIGH (ref 65–99)
Glucose-Capillary: 146 mg/dL — ABNORMAL HIGH (ref 65–99)

## 2017-06-15 LAB — CBC
HCT: 32.7 % — ABNORMAL LOW (ref 39.0–52.0)
Hemoglobin: 11.2 g/dL — ABNORMAL LOW (ref 13.0–17.0)
MCH: 31 pg (ref 26.0–34.0)
MCHC: 34.3 g/dL (ref 30.0–36.0)
MCV: 90.6 fL (ref 78.0–100.0)
Platelets: 587 10*3/uL — ABNORMAL HIGH (ref 150–400)
RBC: 3.61 MIL/uL — ABNORMAL LOW (ref 4.22–5.81)
RDW: 17 % — ABNORMAL HIGH (ref 11.5–15.5)
WBC: 8.4 10*3/uL (ref 4.0–10.5)

## 2017-06-15 LAB — TRIGLYCERIDES: Triglycerides: 136 mg/dL (ref <150)

## 2017-06-15 LAB — MAGNESIUM: Magnesium: 2 mg/dL (ref 1.7–2.4)

## 2017-06-15 LAB — PHOSPHORUS: Phosphorus: 3.4 mg/dL (ref 2.5–4.6)

## 2017-06-15 LAB — PREALBUMIN: Prealbumin: 18.1 mg/dL (ref 18–38)

## 2017-06-15 MED ORDER — IOPAMIDOL (ISOVUE-300) INJECTION 61%
INTRAVENOUS | Status: AC
  Administered 2017-06-15: 15 mL
  Filled 2017-06-15: qty 30

## 2017-06-15 MED ORDER — IOPAMIDOL (ISOVUE-300) INJECTION 61%
15.0000 mL | Freq: Once | INTRAVENOUS | Status: AC | PRN
  Administered 2017-06-15: 15 mL via ORAL
  Filled 2017-06-15: qty 30

## 2017-06-15 MED ORDER — POTASSIUM CHLORIDE 10 MEQ/100ML IV SOLN
10.0000 meq | INTRAVENOUS | Status: AC
  Administered 2017-06-15 (×2): 10 meq via INTRAVENOUS
  Filled 2017-06-15: qty 100

## 2017-06-15 MED ORDER — FAT EMULSION PLANT BASED 20 % IV EMUL
240.0000 mL | INTRAVENOUS | Status: AC
  Administered 2017-06-15: 240 mL via INTRAVENOUS
  Filled 2017-06-15: qty 250

## 2017-06-15 MED ORDER — IOHEXOL 300 MG/ML  SOLN
100.0000 mL | Freq: Once | INTRAMUSCULAR | Status: AC | PRN
  Administered 2017-06-15: 100 mL via INTRAVENOUS

## 2017-06-15 MED ORDER — TRACE MINERALS CR-CU-MN-SE-ZN 10-1000-500-60 MCG/ML IV SOLN
INTRAVENOUS | Status: AC
  Administered 2017-06-15: 17:00:00 via INTRAVENOUS
  Filled 2017-06-15: qty 1992

## 2017-06-15 MED ORDER — LACTATED RINGERS IV SOLN
INTRAVENOUS | Status: DC
  Administered 2017-06-16 – 2017-06-18 (×2): via INTRAVENOUS

## 2017-06-15 NOTE — Progress Notes (Signed)
PROGRESS NOTE    Bryan Floyd  HLK:[id number] DOB: [birth year] DOA: 06/05/2017 PCP: Harlan Stains, MD   Brief Narrative: Bryan Floyd is a 67 y.o. male with past medical history significant for colon polyps with high-grade dysplasia, accelerated hypertension, hyperlipidemia, BPH. He presented for surgery to remove colonic polyps. He is s/p subtotal colectomy w/ anastomosis. He developed uncontrolled hypertension and is currently nothing by mouth.   Assessment & Plan:   Active Problems:   Essential hypertension   Colon cancer ([place])   Essential hypertension Hypertensive urgency Complicated by NPO status. Uncontrolled. Diet has been advanced. Mild improvement of BP. -Continue hydralazine IV; prn SBP;>160 or DBP >90 (change discussed with nursing). -Continue irbesartan, hydrochlorothiazide, hydralazine by mouth  Colon cancer s/p resection Ileus Per primary  Acute blood loss anemia Secondary to recent surgery. Stable.  Leukocytosis Mild. Afebrile. Resolved.  Hyperlipidemia On Crestor as an outpatient. -Continue Crestor  Elevated AST/ALT New. Alkaline phosphatase elevated but within normal limits. General surgery obtaining CT abdomen/pelvis. No abdominal pain.  Obesity Body mass index is 35.53 kg/m.    DVT prophylaxis: Per primary Code Status:   Code Status: Full Code Family Communication: None at bedside Disposition Plan: Per primary   Procedures:   Subtotal colectomy w/ anastomosis  Antimicrobials:  Cefotetan (4/12)    Subjective: Flatus. Had some vomiting. No abdominal pain.  Objective: Vitals:   06/14/17 2205 06/15/17 0516 06/15/17 0519 06/15/17 0750  BP: (!) 154/91  (!) 181/101 (!) 144/73  Pulse: (!) 104  97 97  Resp: 14  14 18   Temp: 98.8 F (37.1 C)  (!) 97.5 F (36.4 C) 98.9 F (37.2 C)  TempSrc: Oral  Oral Oral  SpO2: 100%  98% 97%  Weight:  112.3 kg (247 lb 9.6 oz)    Height:        Intake/Output Summary (Last 24 hours) at  06/15/2017 1041 Last data filed at 06/15/2017 0754 Gross per 24 hour  Intake 1748.67 ml  Output 700 ml  Net 1048.67 ml   Filed Weights   06/13/17 0500 06/14/17 0441 06/15/17 0516  Weight: 116.9 kg (257 lb 11.5 oz) 117.4 kg (258 lb 13.1 oz) 112.3 kg (247 lb 9.6 oz)    Examination:  General: Well appearing, no distress Neuro: Alert, oriented Psych: normal mood, affect, judgement/insight   Data Reviewed: I have personally reviewed following labs and imaging studies  CBC: Recent Labs  Lab 06/09/17 0431 06/11/17 0815 06/13/17 0407 06/15/17 0507  WBC 8.8 11.3* 10.4 8.4  NEUTROABS 6.2 6.5 6.4 5.8  HGB 10.2* 9.9* 9.6* 11.2*  HCT 29.6* 28.5* 28.2* 32.7*  MCV 88.6 89.3 91.0 90.6  PLT 315 436* 488* 734*   Basic Metabolic Panel: Recent Labs  Lab 06/11/17 0815 06/12/17 0428 06/13/17 0407 06/14/17 0552 06/15/17 0507  NA 139 138 141 140 139  K 3.3* 3.6 3.5 3.8 3.5  CL 99* 103 109 110 107  CO2 27 25 23 22 22   GLUCOSE 113* 110* 124* 136* 163*  BUN 19 18 15 12 15   CREATININE 0.98 0.92 0.86 0.84 0.88  CALCIUM 9.2 8.9 9.0 9.2 9.7  MG 2.0 2.0 2.1 2.1 2.0  PHOS 3.0 3.0 2.7 3.0 3.4   GFR: Estimated Creatinine Clearance: 102.2 mL/min (by C-G formula based on SCr of 0.88 mg/dL). Liver Function Tests: Recent Labs  Lab 06/13/17 0407 06/14/17 0552 06/15/17 0507  AST 24 37 80*  ALT 26 41 114*  ALKPHOS 51 58 86  BILITOT 1.0 1.8* 1.7*  PROT 6.7  6.9 8.1  ALBUMIN 3.4* 3.4* 4.0   No results for input(s): LIPASE, AMYLASE in the last 168 hours. No results for input(s): AMMONIA in the last 168 hours. Coagulation Profile: No results for input(s): INR, PROTIME in the last 168 hours. Cardiac Enzymes: No results for input(s): CKTOTAL, CKMB, CKMBINDEX, TROPONINI in the last 168 hours. BNP (last 3 results) No results for input(s): PROBNP in the last 8760 hours. HbA1C: No results for input(s): HGBA1C in the last 72 hours. CBG: Recent Labs  Lab 06/14/17 0551 06/14/17 1211  06/14/17 1809 06/15/17 0017 06/15/17 0617  GLUCAP 109* 124* 133* 137* 146*   Lipid Profile: Recent Labs    06/13/17 0407 06/15/17 0507  TRIG 140 136   Thyroid Function Tests: No results for input(s): TSH, T4TOTAL, FREET4, T3FREE, THYROIDAB in the last 72 hours. Anemia Panel: No results for input(s): VITAMINB12, FOLATE, FERRITIN, TIBC, IRON, RETICCTPCT in the last 72 hours. Sepsis Labs: No results for input(s): PROCALCITON, LATICACIDVEN in the last 168 hours.  No results found for this or any previous visit (from the past 240 hour(s)).       Radiology Studies: No results found.      Scheduled Meds: . acetaminophen  1,000 mg Oral Q6H  . amLODipine  10 mg Oral Daily  . brimonidine  1 drop Both Eyes BID   And  . timolol  1 drop Both Eyes BID  . finasteride  5 mg Oral Daily  . heparin injection (subcutaneous)  5,000 Units Subcutaneous Q8H  . hydrALAZINE  10 mg Oral TID  . hydrochlorothiazide  25 mg Oral Daily  . insulin aspart  0-9 Units Subcutaneous Q6H  . irbesartan  37.5 mg Oral Daily  . latanoprost  1 drop Both Eyes QHS  . metoCLOPramide (REGLAN) injection  10 mg Intravenous Q6H  . rosuvastatin  5 mg Oral q1800  . saccharomyces boulardii  250 mg Oral BID  . tamsulosin  0.4 mg Oral QPC supper   Continuous Infusions: . Marland KitchenTPN (CLINIMIX-E) Adult 70 mL/hr at 06/14/17 1734  . Marland KitchenTPN (CLINIMIX-E) Adult     And  . Fat emulsion    . chlorproMAZINE (THORAZINE) IV 12.5 mg (06/13/17 5974)  . lactated ringers 50 mL/hr (06/14/17 1714)  . lactated ringers    . potassium chloride 10 mEq (06/15/17 0949)     LOS: 10 days     Cordelia Poche, MD [facility] 06/15/2017, 10:41 AM Pager: [phone]  If 7PM-7AM, please contact night-coverage [email or web address] Password [facility] 06/15/2017, 10:41 AM

## 2017-06-15 NOTE — Progress Notes (Addendum)
Subjective NG tube removed yesterday - 1 emesis since. Tolerated clear liquids yesterday. Passing flatus as of overnight and having BMs. He denies abdominal distention - states his belly feels "better" and soft.  Objective: Vital signs in last 24 hours: Temp:  [97.5 F (36.4 C)-98.9 F (37.2 C)] 98.9 F (37.2 C) (04/22 0750) Pulse Rate:  [92-108] 97 (04/22 0750) Resp:  [14-18] 18 (04/22 0750) BP: (144-181)/(73-101) 144/73 (04/22 0750) SpO2:  [97 %-100 %] 97 % (04/22 0750) Weight:  [112.3 kg (247 lb 9.6 oz)] 112.3 kg (247 lb 9.6 oz) (04/22 0516) Last BM Date: 06/13/17  Intake/Output from previous day: 04/21 0701 - 04/22 0700 In: 1748.7 [I.V.:1748.7] Out: 400 [Blood:400] Intake/Output this shift: Total I/O In: -  Out: 300 [Emesis/NG output:300]  Gen: NAD, comfortable CV: RRR Pulm: Normal work of breathing Abd: Soft, NT, ND. Incisions c/d/i without erythema. Ext: SCDs in place  Lab Results: CBC  Recent Labs    06/13/17 0407 06/15/17 0507  WBC 10.4 8.4  HGB 9.6* 11.2*  HCT 28.2* 32.7*  PLT 488* 587*   BMET Recent Labs    06/14/17 0552 06/15/17 0507  NA 140 139  K 3.8 3.5  CL 110 107  CO2 22 22  GLUCOSE 136* 163*  BUN 12 15  CREATININE 0.84 0.88  CALCIUM 9.2 9.7   Anti-infectives: Anti-infectives (From admission, onward)   Start     Dose/Rate Route Frequency Ordered Stop   06/05/17 0545  cefoTEtan in Dextrose 5% (CEFOTAN) 2-2.08 GM-%(50ML) IVPB    Note to Pharmacy:  Waldron Session   : cabinet override      06/05/17 0545 06/05/17 0750   06/05/17 0543  cefoTEtan in Dextrose 5% (CEFOTAN) IVPB 2 g     2 g Intravenous On call to O.R. 06/05/17 0543 06/05/17 0805   06/05/17 0543  neomycin (MYCIFRADIN) tablet 1,000 mg  Status:  Discontinued     1,000 mg Oral 3 times per day 06/05/17 0543 06/05/17 1422   06/05/17 0543  metroNIDAZOLE (FLAGYL) tablet 1,000 mg  Status:  Discontinued     1,000 mg Oral 3 times per day 06/05/17 0543 06/05/17 1422        Assessment/Plan: Patient Active Problem List   Diagnosis Date Noted  . Colon cancer ([place]) 06/05/2017  . Essential hypertension 03/02/2017  . Preop cardiovascular exam 03/02/2017  . Mixed dyslipidemia 03/02/2017  . Overweight 03/02/2017  . BPH (benign prostatic hyperplasia) 03/06/2016  . Colon polyps 03/06/2016  ASSESSMENT Expected postoperative ileus  s/p Procedure(s): LAPAROSCOPIC ASSISTED SUBTOTAL COLECTOMY WITH ILEO DESCENDING ANASTOMOSIS AND TAKEDOWN OF SPLENIC FLEXURE 06/05/2017 (POD#10)  -Afebrile, vitals normal, wbc normal but still with some emesis. Plt up slightly. Will get CT A/P to ensure there isn't an infectious source contributing to his partial ileus. -Continue sips of clears as tolerated; continue TPN -Ambulate 5x/day -PPx: SQH, SCDs -Dispo: Awaiting resolution of ileus   LOS: 10 days   Sharon Mt. Dema Severin, M.D. General and [facility]  Note: This dictation was prepared with Dragon/digital dictation along with [employer]. Any transcriptional errors that result from this process are unintentional.

## 2017-06-15 NOTE — Progress Notes (Signed)
[place] NOTE   Pharmacy Consult for TPN Indication: prolonged ileus  Patient Measurements: Height: 5\' 10"  (177.8 cm) Weight: 247 lb 9.6 oz (112.3 kg) IBW/kg (Calculated) : 73 TPN AdjBW (KG): 84.1 Body mass index is 35.53 kg/m.   Insulin Requirements: 3 units SSI Novolog since new TPN rate started at [place] yesterday (est~6 units in 24 hrs)  Current Nutrition: NPO except ice chips, has NG tube in place  IVF: LR at 50 ml/hr  Central access: PICC placed on 4/18 TPN start date: 4/19  ASSESSMENT                                                                                                          HPI: Patient is a 67 y.o M with hx multiple unresectable colon polyps with high grade dysplasia admitted to [facility] on 06/05/17 for lap subtotal colectomy and takedown of splenic flexure.  To start TPN for prolonged ileus.  Significant events:  - 4/12: subtotal colectomy and takedown of splenic flexure - 4/17: NGT d/t hiccups and emesis - 4/21: NGT d/ced  Recent Labs    06/13/17 0407 06/14/17 0552 06/15/17 0507  NA 141 140 139  K 3.5 3.8 3.5  CL 109 110 107  CO2 23 22 22   GLUCOSE 124* 136* 163*  BUN 15 12 15   CREATININE 0.86 0.84 0.88  CALCIUM 9.0 9.2 9.7  PHOS 2.7 3.0 3.4  MG 2.1 2.1 2.0  ALBUMIN 3.4* 3.4* 4.0  ALKPHOS 51 58 86  AST 24 37 80*  ALT 26 41 114*  BILITOT 1.0 1.8* 1.7*  TRIG 140  --  136  PREALBUMIN 14.6*  --   --     Today:   Glucose (goal <150): all CBG vales at goal except for one (163)  Electrolytes: K 3.5 ; others WNL  Renal: SCr WNL  LFTs: AST/ALT 80/114,  tbili elevated but stable at 1.7  TGs: WNL 140 (4/20), 136 (4/22)  Prealbumin: low at 14.6 (4/20)  NUTRITIONAL GOALS                                                                                             RD recs: 2200 - 2400 KCal/day, 100 -115 grams protein/day  Clinimix E 5/15 at a goal rate of 83 ml/hr + 20% fat emulsion at 20 ml/hr x 12 hrs to  provide: 100 g/day protein,  1900 Kcal/day.  PLAN  Now:  - potassium chloride 10 meq IV x2 runs  At 1800 today:  Increase Clinimix E 5/15 to 83 ml/hr (goal).  20% fat emulsion at 20 ml/hr x12 hrs  TPN to contain standard multivitamins and trace elements.  Pt was not consistently getting Protonix via tube and due to national backorder of IV Protonix was converted to Pepcid - was placed in TPN 4/19   Reduce IVF to 35 ml/hr.  Continue sensitive SSI Novolog q6h  Check daily CMET, phos, mag thru 4/23, then TPN lab panels on Mondays & Thursdays.  F/u daily.  Dia Sitter, PharmD, BCPS 06/15/2017 7:23 AM

## 2017-06-16 LAB — COMPREHENSIVE METABOLIC PANEL
ALT: 73 U/L — ABNORMAL HIGH (ref 17–63)
AST: 38 U/L (ref 15–41)
Albumin: 3.3 g/dL — ABNORMAL LOW (ref 3.5–5.0)
Alkaline Phosphatase: 69 U/L (ref 38–126)
Anion gap: 7 (ref 5–15)
BUN: 17 mg/dL (ref 6–20)
CO2: 24 mmol/L (ref 22–32)
Calcium: 8.8 mg/dL — ABNORMAL LOW (ref 8.9–10.3)
Chloride: 106 mmol/L (ref 101–111)
Creatinine, Ser: 0.93 mg/dL (ref 0.61–1.24)
GFR calc Af Amer: >60 mL/min (ref >60)
GFR calc non Af Amer: >60 mL/min (ref >60)
Glucose, Bld: 142 mg/dL — ABNORMAL HIGH (ref 65–99)
Potassium: 3.5 mmol/L (ref 3.5–5.1)
Sodium: 137 mmol/L (ref 135–145)
Total Bilirubin: 1.2 mg/dL (ref 0.3–1.2)
Total Protein: 6.7 g/dL (ref 6.5–8.1)

## 2017-06-16 LAB — GLUCOSE, CAPILLARY
Glucose-Capillary: 117 mg/dL — ABNORMAL HIGH (ref 65–99)
Glucose-Capillary: 124 mg/dL — ABNORMAL HIGH (ref 65–99)
Glucose-Capillary: 130 mg/dL — ABNORMAL HIGH (ref 65–99)
Glucose-Capillary: 132 mg/dL — ABNORMAL HIGH (ref 65–99)
Glucose-Capillary: 132 mg/dL — ABNORMAL HIGH (ref 65–99)

## 2017-06-16 LAB — MAGNESIUM: Magnesium: 2 mg/dL (ref 1.7–2.4)

## 2017-06-16 LAB — PHOSPHORUS: Phosphorus: 4.2 mg/dL (ref 2.5–4.6)

## 2017-06-16 MED ORDER — FAT EMULSION PLANT BASED 20 % IV EMUL
240.0000 mL | INTRAVENOUS | Status: AC
  Administered 2017-06-16: 240 mL via INTRAVENOUS
  Filled 2017-06-16: qty 250

## 2017-06-16 MED ORDER — TRACE MINERALS CR-CU-MN-SE-ZN 10-1000-500-60 MCG/ML IV SOLN
INTRAVENOUS | Status: AC
  Administered 2017-06-16: 18:00:00 via INTRAVENOUS
  Filled 2017-06-16: qty 1992

## 2017-06-16 MED ORDER — POTASSIUM CHLORIDE 10 MEQ/100ML IV SOLN
10.0000 meq | INTRAVENOUS | Status: AC
  Administered 2017-06-16 (×3): 10 meq via INTRAVENOUS
  Filled 2017-06-16 (×3): qty 100

## 2017-06-16 MED ORDER — ENOXAPARIN SODIUM 40 MG/0.4ML ~~LOC~~ SOLN
40.0000 mg | SUBCUTANEOUS | Status: DC
  Administered 2017-06-16 – 2017-06-18 (×3): 40 mg via SUBCUTANEOUS
  Filled 2017-06-16 (×3): qty 0.4

## 2017-06-16 NOTE — Progress Notes (Signed)
[place] NOTE   Pharmacy Consult for TPN Indication: prolonged ileus  Patient Measurements: Height: 5\' 10"  (177.8 cm) Weight: 249 lb 11.2 oz (113.3 kg) IBW/kg (Calculated) : 73 TPN AdjBW (KG): 84.1 Body mass index is 35.83 kg/m.   Insulin Requirements: 3 units SSI Novolog since new TPN rate started at [place] yesterday (est~6 units in 24 hrs)  Current Nutrition: clears, TPN  IVF: LR at 35 ml/hr  Central access: PICC placed on 4/18 TPN start date: 4/19  ASSESSMENT                                                                                                          HPI: Patient is a 67 y.o M with hx multiple unresectable colon polyps with high grade dysplasia admitted to [facility] on 06/05/17 for lap subtotal colectomy and takedown of splenic flexure.  To start TPN for prolonged ileus.  Significant events:  - 4/12: subtotal colectomy and takedown of splenic flexure - 4/17: NGT d/t hiccups and emesis - 4/21: NGT d/ced  Recent Labs    06/15/17 0507 06/16/17 0423  NA 139 137  K 3.5 3.5  CL 107 106  CO2 22 24  GLUCOSE 163* 142*  BUN 15 17  CREATININE 0.88 0.93  CALCIUM 9.7 8.8*  PHOS 3.4 4.2  MG 2.0 2.0  ALBUMIN 4.0 3.3*  ALKPHOS 86 69  AST 80* 38  ALT 114* 73*  BILITOT 1.7* 1.2  TRIG 136  --   PREALBUMIN 18.1  --     Today:   Glucose (goal <150): at goal  Electrolytes: K 3.5 ; others WNL  Renal: SCr WNL  LFTs: AST/ALT down 38/73,  tbili down 1.2  TGs: WNL 140 (4/20), 136 (4/22)  Prealbumin: low at 14.6 (4/20), 18.1 (4/22)  NUTRITIONAL GOALS                                                                                             RD recs: 2200 - 2400 KCal/day, 100 -115 grams protein/day  Clinimix E 5/15 at a goal rate of 83 ml/hr + 20% fat emulsion at 20 ml/hr x 12 hrs to provide: 100 g/day protein,  1900 Kcal/day.  PLAN  Now:  - potassium chloride 10 meq IV x3 runs  At 1800 today:  continue Clinimix E 5/15 at 83 ml/hr (goal).  20% fat emulsion at 20 ml/hr x12 hrs  TPN to contain standard multivitamins and trace elements.  Pt was not consistently getting Protonix via tube and due to national backorder of IV Protonix was converted to Pepcid - was placed in TPN 4/19   continue IVF to 35 ml/hr.  Continue sensitive SSI Novolog q6h  TPN lab panels on Mondays & Thursdays.  F/u daily.  Dia Sitter, PharmD, BCPS 06/16/2017 7:21 AM

## 2017-06-16 NOTE — Progress Notes (Signed)
PROGRESS NOTE    Bryan Floyd  UJW:[id number] DOB: 15-Dec-1950 DOA: 06/05/2017 PCP: Harlan Stains, MD   Brief Narrative: Buddy Loeffelholz is a 67 y.o. male with past medical history significant for colon polyps with high-grade dysplasia, accelerated hypertension, hyperlipidemia, BPH. He presented for surgery to remove colonic polyps. He is s/p subtotal colectomy w/ anastomosis. He developed uncontrolled hypertension and is currently nothing by mouth.   Assessment & Plan:   Active Problems:   Essential hypertension   Colon cancer (HCC)   Hyperlipidemia   Elevated transaminase level   Essential hypertension Hypertensive urgency Complicated by NPO status initially, now well controlled. Diet has been advanced. -Continue hydralazine IV; prn SBP;>160 or DBP >90 (change discussed with nursing). -Continue irbesartan, hydrochlorothiazide, hydralazine by mouth  Colon cancer s/p resection Ileus Per primary  Acute blood loss anemia Secondary to recent surgery. Stable.  Leukocytosis Mild. Afebrile. Resolved.  Hyperlipidemia On Crestor as an outpatient. -Continue Crestor  Elevated AST/ALT New. Alkaline phosphatase elevated but within normal limits. General surgery obtaining CT abdomen/pelvis. No abdominal pain. Improved today.  Obesity Body mass index is 35.83 kg/m.    DVT prophylaxis: Per primary Code Status:   Code Status: Full Code Family Communication: None at bedside Disposition Plan: Per primary   Procedures:   Subtotal colectomy w/ anastomosis  Antimicrobials:  Cefotetan (4/12)    Subjective: Passing gas. Bowel movements. Tolerating oral diet more. Still on TPN.  Objective: Vitals:   06/15/17 1710 06/15/17 2150 06/16/17 0606 06/16/17 0810  BP: (!) 142/68 126/68 131/82 132/86  Pulse: 96 (!) 104 (!) 113 91  Resp:  18 18 18   Temp: 99.5 F (37.5 C) 98.6 F (37 C) 98 F (36.7 C) 98.1 F (36.7 C)  TempSrc: Oral Oral Oral Oral  SpO2:  96% 99% 100%   Weight:   113.3 kg (249 lb 11.2 oz)   Height:        Intake/Output Summary (Last 24 hours) at 06/16/2017 0943 Last data filed at 06/16/2017 0600 Gross per 24 hour  Intake 2579.44 ml  Output -  Net 2579.44 ml   Filed Weights   06/14/17 0441 06/15/17 0516 06/16/17 0606  Weight: 117.4 kg (258 lb 13.1 oz) 112.3 kg (247 lb 9.6 oz) 113.3 kg (249 lb 11.2 oz)    Examination:  General: Well appearing, no distress Neuro: Alert, oriented Psych: normal mood, affect, judgement/insight   Data Reviewed: I have personally reviewed following labs and imaging studies  CBC: Recent Labs  Lab 06/11/17 0815 06/13/17 0407 06/15/17 0507  WBC 11.3* 10.4 8.4  NEUTROABS 6.5 6.4 5.8  HGB 9.9* 9.6* 11.2*  HCT 28.5* 28.2* 32.7*  MCV 89.3 91.0 90.6  PLT 436* 488* 562*   Basic Metabolic Panel: Recent Labs  Lab 06/12/17 0428 06/13/17 0407 06/14/17 0552 06/15/17 0507 06/16/17 0423  NA 138 141 140 139 137  K 3.6 3.5 3.8 3.5 3.5  CL 103 109 110 107 106  CO2 25 23 22 22 24   GLUCOSE 110* 124* 136* 163* 142*  BUN 18 15 12 15 17   CREATININE 0.92 0.86 0.84 0.88 0.93  CALCIUM 8.9 9.0 9.2 9.7 8.8*  MG 2.0 2.1 2.1 2.0 2.0  PHOS 3.0 2.7 3.0 3.4 4.2   GFR: Estimated Creatinine Clearance: 97.1 mL/min (by C-G formula based on SCr of 0.93 mg/dL). Liver Function Tests: Recent Labs  Lab 06/13/17 0407 06/14/17 0552 06/15/17 0507 06/16/17 0423  AST 24 37 80* 38  ALT 26 41 114* 73*  ALKPHOS 51 58  86 69  BILITOT 1.0 1.8* 1.7* 1.2  PROT 6.7 6.9 8.1 6.7  ALBUMIN 3.4* 3.4* 4.0 3.3*   No results for input(s): LIPASE, AMYLASE in the last 168 hours. No results for input(s): AMMONIA in the last 168 hours. Coagulation Profile: No results for input(s): INR, PROTIME in the last 168 hours. Cardiac Enzymes: No results for input(s): CKTOTAL, CKMB, CKMBINDEX, TROPONINI in the last 168 hours. BNP (last 3 results) No results for input(s): PROBNP in the last 8760 hours. HbA1C: No results for input(s):  HGBA1C in the last 72 hours. CBG: Recent Labs  Lab 06/15/17 0617 06/15/17 1207 06/15/17 1908 06/16/17 0023 06/16/17 0604  GLUCAP 146* 119* 130* 132* 130*   Lipid Profile: Recent Labs    06/15/17 0507  TRIG 136   Thyroid Function Tests: No results for input(s): TSH, T4TOTAL, FREET4, T3FREE, THYROIDAB in the last 72 hours. Anemia Panel: No results for input(s): VITAMINB12, FOLATE, FERRITIN, TIBC, IRON, RETICCTPCT in the last 72 hours. Sepsis Labs: No results for input(s): PROCALCITON, LATICACIDVEN in the last 168 hours.  No results found for this or any previous visit (from the past 240 hour(s)).       Radiology Studies: Ct Abdomen Pelvis W Contrast  Result Date: 06/15/2017 CLINICAL DATA:  Abdominal distension 1 day post laparoscopic assisted subtotal colectomy with an ileo-descending colic anastomosis and take down of splenic flexure EXAM: CT ABDOMEN AND PELVIS WITH CONTRAST TECHNIQUE: Multidetector CT imaging of the abdomen and pelvis was performed using the standard protocol following bolus administration of intravenous contrast. Sagittal and coronal MPR images reconstructed from axial data set. CONTRAST:  11mL OMNIPAQUE IOHEXOL 300 MG/ML SOLN IV. No oral contrast. COMPARISON:  01/30/2017 FINDINGS: Lower chest: Subsegmental atelectasis BILATERAL lower lobes Hepatobiliary: Gallbladder unremarkable.  Small hepatic cysts. Pancreas: Normal appearance Spleen: Normal appearance Adrenals/Urinary Tract: Adrenal glands normal appearance. Small BILATERAL renal cysts. No definite solid mass, hydronephrosis or ureteral calcifications/dilatation. Enlarged prostate gland 6.8 x 5.8 cm image 89, 7.1 cm craniocaudal length on sagittal view. Enlarged prostate gland may account for density extending more superiorly in the bladder on axial images similar to prior exam approximately 4.0 x 3.0 cm image 85, a sagittal imaging suggesting this is of prostatic origin though confluent bladder mass is not  completely excluded. Presence of this finding since 01/29/2017 makes intraluminal clot less likely. Stomach/Bowel: Interval proximal colectomy with ileocolic anastomosis in the RIGHT mid abdomen. Expected mild bowel wall thickening at the anastomosis. Proximal small bowel loops are dilated, more distal loops less so, favor postoperative ileus. No free air. Mesenteric stranding/infiltration at surgical site. Vascular/Lymphatic: Atherosclerotic calcifications aorta and iliac arteries. Reproductive: N/A Other: Postsurgical changes at the anterior abdominal wall. Question tiny umbilical hernia containing fat. No free air free fluid. Musculoskeletal: Degenerative changes of the hip joints bilaterally greater on RIGHT. IMPRESSION: Postoperative changes from partial colectomy with ileocolic anastomosis in the RIGHT mid abdomen at the descending colon level. Dilated proximal small bowel loops favoring postoperative ileus. Significant prostatic enlargement extending into the urinary bladder posteriorly, likely accounting for posterior bladder density on axial images though cannot completely exclude confluent bladder tumor with this appearance; as this has persisted since the previous exam, recommend follow-up cystoscopy to confirm prostatic origin of posterior bladder mass and exclude bladder neoplasm. Electronically Signed   By: Lavonia Dana M.D.   On: 06/15/2017 12:12        Scheduled Meds: . acetaminophen  1,000 mg Oral Q6H  . amLODipine  10 mg Oral Daily  .  brimonidine  1 drop Both Eyes BID   And  . timolol  1 drop Both Eyes BID  . enoxaparin (LOVENOX) injection  40 mg Subcutaneous Q24H  . finasteride  5 mg Oral Daily  . hydrALAZINE  10 mg Oral TID  . hydrochlorothiazide  25 mg Oral Daily  . insulin aspart  0-9 Units Subcutaneous Q6H  . irbesartan  37.5 mg Oral Daily  . latanoprost  1 drop Both Eyes QHS  . metoCLOPramide (REGLAN) injection  10 mg Intravenous Q6H  . rosuvastatin  5 mg Oral q1800  .  saccharomyces boulardii  250 mg Oral BID  . tamsulosin  0.4 mg Oral QPC supper   Continuous Infusions: . Marland KitchenTPN (CLINIMIX-E) Adult 83 mL/hr at 06/16/17 0600  . Marland KitchenTPN (CLINIMIX-E) Adult     And  . Fat emulsion    . chlorproMAZINE (THORAZINE) IV 12.5 mg (06/13/17 7282)  . lactated ringers    . potassium chloride 10 mEq (06/16/17 0827)     LOS: 11 days     Cordelia Poche, MD [facility] 06/16/2017, 9:43 AM Pager: [phone]  If 7PM-7AM, please contact night-coverage [email or web address] Password [facility] 06/16/2017, 9:43 AM

## 2017-06-16 NOTE — Care Management Important Message (Signed)
Important Message  Patient Details  Name: Bryan Floyd MRN: [id number] Date of Birth: 1950-08-29   Medicare Important Message Given:  Yes    Kerin Salen 06/16/2017, 11:15 AMImportant Message  Patient Details  Name: Bryan Floyd MRN: [id number] Date of Birth: 10-18-1950   Medicare Important Message Given:  Yes    Kerin Salen 06/16/2017, 11:15 AM

## 2017-06-16 NOTE — Consult Note (Signed)
   [facility] Inpatient Consult   06/16/2017  Bryan Floyd 11-Apr-1950 [id number]   Spoke with [name] and wife at bedside about potential [facility] Care Management services. [name] denies having any transportation, medication, or community resource needs at this time.   Discussed that he will likely receive EMMI general discharge call upon hospital discharge. Made him aware of potential [facility] Telephonic follow up if warranted. [name] and wife express understanding.  Provided [facility] Care Management brochure with 24-hr nurse advice line magnet.   Appreciative of visit.    Marthenia Rolling, MSN-Ed, RN,BSN [facility] Liaison [phone]

## 2017-06-16 NOTE — Progress Notes (Signed)
Subjective Tolerated clear liquids x2d now. Passing flatus and having BMs. He denies abdominal distention - states his belly feels better and soft. Drank ~1.5L of liquids without emesis yesterday. CT completed yesterday showed probable postoperative ileus.  Objective: Vital signs in last 24 hours: Temp:  [98 F (36.7 C)-99.5 F (37.5 C)] 98.1 F (36.7 C) (04/23 0810) Pulse Rate:  [91-113] 91 (04/23 0810) Resp:  [17-18] 18 (04/23 0810) BP: (126-158)/(68-88) 132/86 (04/23 0810) SpO2:  [96 %-100 %] 100 % (04/23 0810) Weight:  [113.3 kg (249 lb 11.2 oz)] 113.3 kg (249 lb 11.2 oz) (04/23 0606) Last BM Date: 06/16/17  Intake/Output from previous day: 04/22 0701 - 04/23 0700 In: 2579.4 [I.V.:2579.4] Out: 300 [Emesis/NG output:300] Intake/Output this shift: No intake/output data recorded.  Gen: NAD, comfortable CV: RRR Pulm: Normal work of breathing Abd: Soft, NT, ND. Incisions c/d/i without erythema. Ext: SCDs in place  Lab Results: CBC  Recent Labs    06/15/17 0507  WBC 8.4  HGB 11.2*  HCT 32.7*  PLT 587*   BMET Recent Labs    06/15/17 0507 06/16/17 0423  NA 139 137  K 3.5 3.5  CL 107 106  CO2 22 24  GLUCOSE 163* 142*  BUN 15 17  CREATININE 0.88 0.93  CALCIUM 9.7 8.8*   Anti-infectives: Anti-infectives (From admission, onward)   Start     Dose/Rate Route Frequency Ordered Stop   06/05/17 0545  cefoTEtan in Dextrose 5% (CEFOTAN) 2-2.08 GM-%(50ML) IVPB    Note to Pharmacy:  Waldron Session   : cabinet override      06/05/17 0545 06/05/17 0750   06/05/17 0543  cefoTEtan in Dextrose 5% (CEFOTAN) IVPB 2 g     2 g Intravenous On call to O.R. 06/05/17 0543 06/05/17 0805   06/05/17 0543  neomycin (MYCIFRADIN) tablet 1,000 mg  Status:  Discontinued     1,000 mg Oral 3 times per day 06/05/17 0543 06/05/17 1422   06/05/17 0543  metroNIDAZOLE (FLAGYL) tablet 1,000 mg  Status:  Discontinued     1,000 mg Oral 3 times per day 06/05/17 0543 06/05/17 1422        Assessment/Plan: Patient Active Problem List   Diagnosis Date Noted  . Hyperlipidemia 06/15/2017  . Elevated transaminase level 06/15/2017  . Colon cancer ([place]) 06/05/2017  . Essential hypertension 03/02/2017  . Preop cardiovascular exam 03/02/2017  . Mixed dyslipidemia 03/02/2017  . Overweight 03/02/2017  . BPH (benign prostatic hyperplasia) 03/06/2016  . Colon polyps 03/06/2016  ASSESSMENT Expected postoperative ileus  s/p Procedure(s): LAPAROSCOPIC ASSISTED SUBTOTAL COLECTOMY WITH ILEO DESCENDING ANASTOMOSIS AND TAKEDOWN OF SPLENIC FLEXURE 06/05/2017 (POD#11)  -Clears today; will consider advancing to fulls later if continues to tolerate; continue TPN -Ambulate 5x/day -PPx: SQH, SCDs -Dispo: Awaiting complete resolution of ileus   LOS: 11 days   Sharon Mt. Dema Severin, M.D. General and [facility]  Note: This dictation was prepared with Dragon/digital dictation along with [employer]. Any transcriptional errors that result from this process are unintentional.

## 2017-06-17 LAB — BASIC METABOLIC PANEL
Anion gap: 8 (ref 5–15)
BUN: 15 mg/dL (ref 6–20)
CO2: 20 mmol/L — ABNORMAL LOW (ref 22–32)
Calcium: 8.9 mg/dL (ref 8.9–10.3)
Chloride: 105 mmol/L (ref 101–111)
Creatinine, Ser: 0.95 mg/dL (ref 0.61–1.24)
GFR calc Af Amer: >60 mL/min (ref >60)
GFR calc non Af Amer: >60 mL/min (ref >60)
Glucose, Bld: 124 mg/dL — ABNORMAL HIGH (ref 65–99)
Potassium: 3.8 mmol/L (ref 3.5–5.1)
Sodium: 133 mmol/L — ABNORMAL LOW (ref 135–145)

## 2017-06-17 LAB — GLUCOSE, CAPILLARY
Glucose-Capillary: 117 mg/dL — ABNORMAL HIGH (ref 65–99)
Glucose-Capillary: 120 mg/dL — ABNORMAL HIGH (ref 65–99)
Glucose-Capillary: 121 mg/dL — ABNORMAL HIGH (ref 65–99)
Glucose-Capillary: 132 mg/dL — ABNORMAL HIGH (ref 65–99)

## 2017-06-17 MED ORDER — PANTOPRAZOLE SODIUM 40 MG PO TBEC
40.0000 mg | DELAYED_RELEASE_TABLET | Freq: Every day | ORAL | Status: DC
Start: 2017-06-17 — End: 2017-06-19
  Administered 2017-06-17 – 2017-06-19 (×3): 40 mg via ORAL
  Filled 2017-06-17 (×3): qty 1

## 2017-06-17 MED ORDER — FAT EMULSION PLANT BASED 20 % IV EMUL
240.0000 mL | INTRAVENOUS | Status: DC
  Administered 2017-06-17: 240 mL via INTRAVENOUS
  Filled 2017-06-17: qty 250

## 2017-06-17 MED ORDER — HYDRALAZINE HCL 20 MG/ML IJ SOLN: 10.0000 mg | Freq: Four times a day (QID) | INTRAMUSCULAR | Status: DC | PRN

## 2017-06-17 MED ORDER — INSULIN ASPART 100 UNIT/ML ~~LOC~~ SOLN: 0.0000 [IU] | Freq: Two times a day (BID) | SUBCUTANEOUS | Status: DC

## 2017-06-17 MED ORDER — POTASSIUM CHLORIDE CRYS ER 20 MEQ PO TBCR
40.0000 meq | EXTENDED_RELEASE_TABLET | Freq: Once | ORAL | Status: AC
  Administered 2017-06-17: 40 meq via ORAL
  Filled 2017-06-17: qty 2

## 2017-06-17 MED ORDER — TRACE MINERALS CR-CU-MN-SE-ZN 10-1000-500-60 MCG/ML IV SOLN
INTRAVENOUS | Status: AC
  Administered 2017-06-17: 18:00:00 via INTRAVENOUS
  Filled 2017-06-17: qty 1992

## 2017-06-17 MED ORDER — BOOST / RESOURCE BREEZE PO LIQD CUSTOM
1.0000 | Freq: Two times a day (BID) | ORAL | Status: DC
  Administered 2017-06-17 – 2017-06-19 (×4): 1 via ORAL

## 2017-06-17 NOTE — Progress Notes (Signed)
PHARMACY - ADULT TOTAL PARENTERAL NUTRITION CONSULT NOTE   Pharmacy Consult for TPN Indication: postop ileus  Patient Measurements: Height: 5\' 10"  (177.8 cm) Weight: 250 lb 14.1 oz (113.8 kg) IBW/kg (Calculated) : 73 TPN AdjBW (KG): 84.1 Body mass index is 36 kg/m.   Insulin Requirements: 3 units SSI Novolog yesterday  Current Nutrition: Diet advanced to soft + TPN  IVF: LR at 35 ml/hr  Central access: PICC placed on 4/18 TPN start date: 4/19  Recent Labs    06/15/17 0507 06/16/17 0423 06/17/17 0755  NA 139 137 133*  K 3.5 3.5 3.8  CL 107 106 105  CO2 22 24 20*  GLUCOSE 163* 142* 124*  BUN 15 17 15   CREATININE 0.88 0.93 0.95  CALCIUM 9.7 8.8* 8.9  PHOS 3.4 4.2  --   MG 2.0 2.0  --   ALBUMIN 4.0 3.3*  --   ALKPHOS 86 69  --   AST 80* 38  --   ALT 114* 73*  --   BILITOT 1.7* 1.2  --   TRIG 136  --   --   PREALBUMIN 18.1  --   --    ASSESSMENT                                                                                                          HPI: 84 yoM with hx multiple unresectable colon polyps with high grade dysplasia admitted to [facility] on 06/05/17 for lap subtotal colectomy and takedown of splenic flexure. To start TPN for postop ileus and expected prolonged NPO status  Significant events:  - 4/12: subtotal colectomy and takedown of splenic flexure - 4/17: NGT d/t hiccups and emesis - 4/21: NGT d/ced, advanced to CLD - 4/24: advanced to soft diet. +flatus and BM. Per RN, tolerated clears and first soft diet meal well  Today:   Glucose (goal <150): stable at goal  Electrolytes: Na slightly low; K below goal for ileus; others WNL  Renal: SCr WNL  LFTs: improved to WNL  TGs: stable < 150  Prealbumin: improved to borderline low on 4/22  NUTRITIONAL GOALS                                                                                             RD recs: 2200 - 2400 kcal/day, 100 -115 grams protein/day - Clinimix E 5/15 at a goal rate of 83 ml/hr +  20% fat emulsion at 20 ml/hr x 12 hrs to provide: 100 g/day protein,  1900 kcal/day.  PLAN                                                                                                                          -  Kdur 40 mEq PO x 1  At 1800 today:  Continue Clinimix E 5/15 at goal rate of 83 ml/hr. Will begin to wean TPN if tolerating soft diet well x 24 hr  20% fat emulsion at 20 ml/hr x12 hrs  TPN to contain standard multivitamins and trace elements  Will convert IV famotidine in TPN back to pantoprazole PO tabs (patient was originally converted to IV famotidine from pantoprazole PO liquid d/t inconsistent ability to take previously  continue IVF to 35 ml/hr  Continue sensitive SSI; will reduce CBG checks to q12 as patient is stable  TPN lab panels on Mondays & Thursdays  F/u daily  Reuel Boom, PharmD, BCPS [phone] 06/17/2017, 10:17 AM

## 2017-06-17 NOTE — Progress Notes (Addendum)
Nutrition Follow-up  DOCUMENTATION CODES:   Obesity unspecified  INTERVENTION:    TPN per pharmacy  Boost Breeze po BID, each supplement provides 250 kcal and 9 grams of protein  Encourage PO intake, monitor for tolerance and TPN rate change  NUTRITION DIAGNOSIS:   Inadequate oral intake related to inability to eat as evidenced by NPO status.  Pt's diet advanced to Soft, intake increasing  GOAL:   Patient will meet greater than or equal to 90% of their needs  Met via TPN, Oral intake progressing   MONITOR:   Diet advancement, Other (Comment), I & O's, Weight trends, Skin(TPN)  ASSESSMENT:   67 year old male who was admitted s/p laparoscopic subtotal colectomy with ileocolic anastomosis. PMH significant for colon polyps with high-grade dysplasia, accelerated hypertension, hyperlipidemia, and BPH. Consulted for new TPN given prolonged ileus.  Pt continues with Clinimix E 5/15 at goal rate of 83 ml/hr with 20% fat emulsion at 20 ml/hr x12 hrs.   Pt's diet advanced to Soft. Meal completion records reveal pt consumed 100% of breakfast today. Will be able to begin weaning if pt continues to tolerate diet well.   Pt reports no N/V at visit but states he has had some loose stools. Pt's NGT removed 06/14/17.   RD encouraged PO intake. Pt amenable to Boost Breeze supplementation but has declined Boost Plus, states he dislikes the milky texture.   Labs reviewed; CBG 117-132, Na 133, Albumin 3.3  Medications reviewed; sliding scale insulin, Reglan, Protonix, LR at 35 mL/hr  Diet Order:  .TPN (CLINIMIX-E) Adult DIET SOFT Room service appropriate? Yes; Fluid consistency: Thin .TPN (CLINIMIX-E) Adult  EDUCATION NEEDS:   Education needs have been addressed  Skin:  Skin Assessment: Skin Integrity Issues: Skin Integrity Issues:: Incisions Incisions: abdomen, perineum  Last BM:  06/17/17  Height:   Ht Readings from Last 1 Encounters:  06/05/17 _0  (1.778 m)    Weight:    Wt Readings from Last 1 Encounters:  06/17/17 250 lb 14.1 oz (113.8 kg)    Ideal Body Weight:  75.5 kg  BMI:  Body mass index is 36 kg/m.  Estimated Nutritional Needs:   Kcal:  2200-2400 kcal/day (MSJ x 1.2-1.3)  Protein:  100-115 grams/day  Fluid:  >/= 2.2 L/day  Marjo Bicker, MS, RDN, LDN 06/17/2017 12:03 PM

## 2017-06-17 NOTE — Final Consult Note (Signed)
PROGRESS NOTE    Bryan Floyd  PJA:[id number] DOB: [birth year] DOA: 06/05/2017 PCP: Harlan Stains, MD   Brief Narrative: Bryan Floyd is a 67 y.o. male with past medical history significant for colon polyps with high-grade dysplasia, accelerated hypertension, hyperlipidemia, BPH. He presented for surgery to remove colonic polyps. He is s/p subtotal colectomy w/ anastomosis. He developed uncontrolled hypertension and is currently nothing by mouth. Blood pressure better controlled now that he is on home regimen.   Assessment & Plan:   Active Problems:   Essential hypertension   Colon cancer (HCC)   Hyperlipidemia   Elevated transaminase level   Essential hypertension Hypertensive urgency Complicated by NPO status initially, now well controlled. Diet has been advanced. -Continue hydralazine IV; prn SBP;>160 or DBP >90 -Continue irbesartan, hydrochlorothiazide, hydralazine  Medicine will sign off. Please re-consult if any further questions and we will be glad to see this patient again. Thank you!  Colon cancer s/p resection Ileus Per primary  Acute blood loss anemia Secondary to recent surgery. Stable.  Leukocytosis Mild. Afebrile. Resolved.  Hyperlipidemia On Crestor as an outpatient. -Continue Crestor  Elevated AST/ALT New. Alkaline phosphatase elevated but within normal limits. General surgery obtaining CT abdomen/pelvis. No abdominal pain. Improved.  Obesity Body mass index is 36 kg/m.    DVT prophylaxis: Per primary Code Status:   Code Status: Full Code Family Communication: None at bedside Disposition Plan: Per primary   Procedures:   Subtotal colectomy w/ anastomosis  Antimicrobials:  Cefotetan (4/12)    Subjective: Good appetite. Excited to advance his diet today.  Objective: Vitals:   06/16/17 1706 06/16/17 2125 06/17/17 0547 06/17/17 0700  BP: (!) 152/83 (!) 146/73 (!) 142/80   Pulse:  93 98   Resp:  18 18   Temp:  99.1 F  (37.3 C) 99.5 F (37.5 C)   TempSrc:  Oral Oral   SpO2:  98% 98%   Weight:    113.8 kg (250 lb 14.1 oz)  Height:        Intake/Output Summary (Last 24 hours) at 06/17/2017 0850 Last data filed at 06/17/2017 0224 Gross per 24 hour  Intake 3206.72 ml  Output -  Net 3206.72 ml   Filed Weights   06/15/17 0516 06/16/17 0606 06/17/17 0700  Weight: 112.3 kg (247 lb 9.6 oz) 113.3 kg (249 lb 11.2 oz) 113.8 kg (250 lb 14.1 oz)    Examination:  General: Well appearing, no distress Neuro: normal gait   Data Reviewed: I have personally reviewed following labs and imaging studies  CBC: Recent Labs  Lab 06/11/17 0815 06/13/17 0407 06/15/17 0507  WBC 11.3* 10.4 8.4  NEUTROABS 6.5 6.4 5.8  HGB 9.9* 9.6* 11.2*  HCT 28.5* 28.2* 32.7*  MCV 89.3 91.0 90.6  PLT 436* 488* 341*   Basic Metabolic Panel: Recent Labs  Lab 06/12/17 0428 06/13/17 0407 06/14/17 0552 06/15/17 0507 06/16/17 0423 06/17/17 0755  NA 138 141 140 139 137 133*  K 3.6 3.5 3.8 3.5 3.5 3.8  CL 103 109 110 107 106 105  CO2 25 23 22 22 24  20*  GLUCOSE 110* 124* 136* 163* 142* 124*  BUN 18 15 12 15 17 15   CREATININE 0.92 0.86 0.84 0.88 0.93 0.95  CALCIUM 8.9 9.0 9.2 9.7 8.8* 8.9  MG 2.0 2.1 2.1 2.0 2.0  --   PHOS 3.0 2.7 3.0 3.4 4.2  --    GFR: Estimated Creatinine Clearance: 95.3 mL/min (by C-G formula based on SCr of 0.95 mg/dL). Liver Function  Tests: Recent Labs  Lab 06/13/17 0407 06/14/17 0552 06/15/17 0507 06/16/17 0423  AST 24 37 80* 38  ALT 26 41 114* 73*  ALKPHOS 51 58 86 69  BILITOT 1.0 1.8* 1.7* 1.2  PROT 6.7 6.9 8.1 6.7  ALBUMIN 3.4* 3.4* 4.0 3.3*   No results for input(s): LIPASE, AMYLASE in the last 168 hours. No results for input(s): AMMONIA in the last 168 hours. Coagulation Profile: No results for input(s): INR, PROTIME in the last 168 hours. Cardiac Enzymes: No results for input(s): CKTOTAL, CKMB, CKMBINDEX, TROPONINI in the last 168 hours. BNP (last 3 results) No results for  input(s): PROBNP in the last 8760 hours. HbA1C: No results for input(s): HGBA1C in the last 72 hours. CBG: Recent Labs  Lab 06/16/17 0604 06/16/17 1145 06/16/17 1755 06/16/17 2351 06/17/17 0545  GLUCAP 130* 132* 117* 124* 121*   Lipid Profile: Recent Labs    06/15/17 0507  TRIG 136   Thyroid Function Tests: No results for input(s): TSH, T4TOTAL, FREET4, T3FREE, THYROIDAB in the last 72 hours. Anemia Panel: No results for input(s): VITAMINB12, FOLATE, FERRITIN, TIBC, IRON, RETICCTPCT in the last 72 hours. Sepsis Labs: No results for input(s): PROCALCITON, LATICACIDVEN in the last 168 hours.  No results found for this or any previous visit (from the past 240 hour(s)).       Radiology Studies: Ct Abdomen Pelvis W Contrast  Result Date: 06/15/2017 CLINICAL DATA:  Abdominal distension 1 day post laparoscopic assisted subtotal colectomy with an ileo-descending colic anastomosis and take down of splenic flexure EXAM: CT ABDOMEN AND PELVIS WITH CONTRAST TECHNIQUE: Multidetector CT imaging of the abdomen and pelvis was performed using the standard protocol following bolus administration of intravenous contrast. Sagittal and coronal MPR images reconstructed from axial data set. CONTRAST:  174mL OMNIPAQUE IOHEXOL 300 MG/ML SOLN IV. No oral contrast. COMPARISON:  01/30/2017 FINDINGS: Lower chest: Subsegmental atelectasis BILATERAL lower lobes Hepatobiliary: Gallbladder unremarkable.  Small hepatic cysts. Pancreas: Normal appearance Spleen: Normal appearance Adrenals/Urinary Tract: Adrenal glands normal appearance. Small BILATERAL renal cysts. No definite solid mass, hydronephrosis or ureteral calcifications/dilatation. Enlarged prostate gland 6.8 x 5.8 cm image 89, 7.1 cm craniocaudal length on sagittal view. Enlarged prostate gland may account for density extending more superiorly in the bladder on axial images similar to prior exam approximately 4.0 x 3.0 cm image 85, a sagittal imaging  suggesting this is of prostatic origin though confluent bladder mass is not completely excluded. Presence of this finding since 01/29/2017 makes intraluminal clot less likely. Stomach/Bowel: Interval proximal colectomy with ileocolic anastomosis in the RIGHT mid abdomen. Expected mild bowel wall thickening at the anastomosis. Proximal small bowel loops are dilated, more distal loops less so, favor postoperative ileus. No free air. Mesenteric stranding/infiltration at surgical site. Vascular/Lymphatic: Atherosclerotic calcifications aorta and iliac arteries. Reproductive: N/A Other: Postsurgical changes at the anterior abdominal wall. Question tiny umbilical hernia containing fat. No free air free fluid. Musculoskeletal: Degenerative changes of the hip joints bilaterally greater on RIGHT. IMPRESSION: Postoperative changes from partial colectomy with ileocolic anastomosis in the RIGHT mid abdomen at the descending colon level. Dilated proximal small bowel loops favoring postoperative ileus. Significant prostatic enlargement extending into the urinary bladder posteriorly, likely accounting for posterior bladder density on axial images though cannot completely exclude confluent bladder tumor with this appearance; as this has persisted since the previous exam, recommend follow-up cystoscopy to confirm prostatic origin of posterior bladder mass and exclude bladder neoplasm. Electronically Signed   By: Crist Infante.D.  On: 06/15/2017 12:12        Scheduled Meds: . acetaminophen  1,000 mg Oral Q6H  . amLODipine  10 mg Oral Daily  . brimonidine  1 drop Both Eyes BID   And  . timolol  1 drop Both Eyes BID  . enoxaparin (LOVENOX) injection  40 mg Subcutaneous Q24H  . finasteride  5 mg Oral Daily  . hydrALAZINE  10 mg Oral TID  . hydrochlorothiazide  25 mg Oral Daily  . insulin aspart  0-9 Units Subcutaneous Q6H  . irbesartan  37.5 mg Oral Daily  . latanoprost  1 drop Both Eyes QHS  . metoCLOPramide  (REGLAN) injection  10 mg Intravenous Q6H  . rosuvastatin  5 mg Oral q1800  . saccharomyces boulardii  250 mg Oral BID  . tamsulosin  0.4 mg Oral QPC supper   Continuous Infusions: . Marland KitchenTPN (CLINIMIX-E) Adult 83 mL/hr at 06/17/17 0200  . chlorproMAZINE (THORAZINE) IV 12.5 mg (06/13/17 7096)  . lactated ringers 35 mL/hr at 06/17/17 0200     LOS: 12 days     Cordelia Poche, MD [facility] 06/17/2017, 8:50 AM Pager: [phone]  If 7PM-7AM, please contact night-coverage [email or web address] Password [facility] 06/17/2017, 8:50 AM

## 2017-06-18 LAB — COMPREHENSIVE METABOLIC PANEL
ALT: 48 U/L (ref 17–63)
AST: 35 U/L (ref 15–41)
Albumin: 3.2 g/dL — ABNORMAL LOW (ref 3.5–5.0)
Alkaline Phosphatase: 67 U/L (ref 38–126)
Anion gap: 4 — ABNORMAL LOW (ref 5–15)
BUN: 13 mg/dL (ref 6–20)
CO2: 22 mmol/L (ref 22–32)
Calcium: 8.8 mg/dL — ABNORMAL LOW (ref 8.9–10.3)
Chloride: 106 mmol/L (ref 101–111)
Creatinine, Ser: 0.77 mg/dL (ref 0.61–1.24)
GFR calc Af Amer: >60 mL/min (ref >60)
GFR calc non Af Amer: >60 mL/min (ref >60)
Glucose, Bld: 120 mg/dL — ABNORMAL HIGH (ref 65–99)
Potassium: 4.1 mmol/L (ref 3.5–5.1)
Sodium: 132 mmol/L — ABNORMAL LOW (ref 135–145)
Total Bilirubin: 0.7 mg/dL (ref 0.3–1.2)
Total Protein: 6.8 g/dL (ref 6.5–8.1)

## 2017-06-18 LAB — GLUCOSE, CAPILLARY
Glucose-Capillary: 131 mg/dL — ABNORMAL HIGH (ref 65–99)
Glucose-Capillary: 114 mg/dL — ABNORMAL HIGH (ref 65–99)
Glucose-Capillary: 109 mg/dL — ABNORMAL HIGH (ref 65–99)
Glucose-Capillary: 109 mg/dL — ABNORMAL HIGH (ref 65–99)

## 2017-06-18 LAB — MAGNESIUM: Magnesium: 1.8 mg/dL (ref 1.7–2.4)

## 2017-06-18 LAB — PHOSPHORUS: Phosphorus: 3.5 mg/dL (ref 2.5–4.6)

## 2017-06-18 NOTE — Progress Notes (Signed)
Subjective Tolerating soft diet without n/v. Denies abdominal distention. Having multiple BMs per day. Denies any complaints on rounds today. Objective: Vital signs in last 24 hours: Temp:  [98.9 F (37.2 C)-99.1 F (37.3 C)] 99.1 F (37.3 C) (04/25 0524) Pulse Rate:  [82-103] 91 (04/25 0524) Resp:  [18-20] 18 (04/25 0524) BP: (134-150)/(68-76) 143/75 (04/25 0524) SpO2:  [97 %-100 %] 97 % (04/25 0524) Weight:  [113.6 kg (250 lb 7.1 oz)] 113.6 kg (250 lb 7.1 oz) (04/25 0524) Last BM Date: 06/18/17  Intake/Output from previous day: 04/24 0701 - 04/25 0700 In: 4856.8 [P.O.:1440; I.V.:3416.8] Out: -  Intake/Output this shift: No intake/output data recorded.  Gen: NAD, comfortable CV: RRR Pulm: Normal work of breathing Abd: Soft, NT, ND. Incisions c/d/i without erythema. Ext: SCDs in place  Lab Results: CBC  No results for input(s): WBC, HGB, HCT, PLT in the last 72 hours. BMET Recent Labs    06/17/17 0755 06/18/17 0442  NA 133* 132*  K 3.8 4.1  CL 105 106  CO2 20* 22  GLUCOSE 124* 120*  BUN 15 13  CREATININE 0.95 0.77  CALCIUM 8.9 8.8*   Anti-infectives: Anti-infectives (From admission, onward)   Start     Dose/Rate Route Frequency Ordered Stop   06/05/17 0545  cefoTEtan in Dextrose 5% (CEFOTAN) 2-2.08 GM-%(50ML) IVPB    Note to Pharmacy:  Waldron Session   : cabinet override      06/05/17 0545 06/05/17 0750   06/05/17 0543  cefoTEtan in Dextrose 5% (CEFOTAN) IVPB 2 g     2 g Intravenous On call to O.R. 06/05/17 0543 06/05/17 0805   06/05/17 0543  neomycin (MYCIFRADIN) tablet 1,000 mg  Status:  Discontinued     1,000 mg Oral 3 times per day 06/05/17 0543 06/05/17 1422   06/05/17 0543  metroNIDAZOLE (FLAGYL) tablet 1,000 mg  Status:  Discontinued     1,000 mg Oral 3 times per day 06/05/17 0543 06/05/17 1422       Assessment/Plan: Patient Active Problem List   Diagnosis Date Noted  . Hyperlipidemia 06/15/2017  . Elevated transaminase level 06/15/2017  .  Colon cancer ([place]) 06/05/2017  . Essential hypertension 03/02/2017  . Preop cardiovascular exam 03/02/2017  . Mixed dyslipidemia 03/02/2017  . Overweight 03/02/2017  . BPH (benign prostatic hyperplasia) 03/06/2016  . Colon polyps 03/06/2016  ASSESSMENT Resolving postoperative ileus  s/p Procedure(s): LAPAROSCOPIC ASSISTED SUBTOTAL COLECTOMY WITH ILEO DESCENDING ANASTOMOSIS AND TAKEDOWN OF SPLENIC FLEXURE 06/05/2017 (POD#13)  -Soft diet as tolerated - wean tpn today per pharmacy - consult placed -Calorie count today -Ambulate 5x/day -PPx: SQH, SCDs -Dispo: if continues to tolerate diet today, having bms and feeling well tomorrow, will plan discharge   LOS: 13 days   Sharon Mt. Dema Severin, M.D. [facility]  Note: This dictation was prepared with Dragon/digital dictation along with [employer]. Any transcriptional errors that result from this process are unintentional.

## 2017-06-18 NOTE — Progress Notes (Signed)
[place] CONSULT NOTE   Pharmacy Consult for TPN Indication: postop ileus  Patient Measurements: Height: 5\' 10"  (177.8 cm) Weight: 250 lb 7.1 oz (113.6 kg) IBW/kg (Calculated) : 73 TPN AdjBW (KG): 84.1 Body mass index is 35.93 kg/m.   Insulin Requirements: 2 units SSI Novolog yesterday  Current Nutrition: Diet advanced to soft + TPN  IVF: LR at 35 ml/hr  Central access: PICC placed on 4/18 TPN start date: 4/19  Recent Labs    06/16/17 0423 06/17/17 0755 06/18/17 0442  NA 137 133* 132*  K 3.5 3.8 4.1  CL 106 105 106  CO2 24 20* 22  GLUCOSE 142* 124* 120*  BUN 17 15 13   CREATININE 0.93 0.95 0.77  CALCIUM 8.8* 8.9 8.8*  PHOS 4.2  --  3.5  MG 2.0  --  1.8  ALBUMIN 3.3*  --  3.2*  ALKPHOS 69  --  67  AST 38  --  35  ALT 73*  --  48  BILITOT 1.2  --  0.7   ASSESSMENT                                                                                                          HPI: 41 yoM with hx multiple unresectable colon polyps with high grade dysplasia admitted to [facility] on 06/05[facility]17 for lap subtotal colectomy and takedown of splenic flexure. To start TPN for postop ileus and expected prolonged NPO status  Significant events:  - 4/12: subtotal colectomy and takedown of splenic flexure - 4/17: NGT d/t hiccups and emesis - 4/21: NGT d/ced, advanced to CLD - 4/24: advanced to soft diet. +flatus and BM. Per RN, tolerated clears and first soft diet meal well - 4/25: Surgery asking to taper/discontinue TPN today as patient tolerating soft diet well with return of bowel function. RN estimates intake is > 75% of needs.  Today:   Glucose (goal <150): stable at goal  Electrolytes: Na slightly low; otherwise WNL  Renal: SCr WNL  LFTs: improved to WNL; albumin slightly low  TGs: stable < 150  Prealbumin: improved to borderline low on 4/22  NUTRITIONAL GOALS                                                                                              RD recs: 2200 - 2400 kcal/day, 100 -115 grams protein/day - Clinimix E 5/15 at a goal rate of 83 ml/hr + 20% fat emulsion at 20 ml/hr x 12 hrs to provide: 100 g/day protein, 1900 kcal/day.  PLAN  Continue Clinimix E 5/15 at goal rate of 83 ml/hr until 1600 today; at that time reduce rate to 40 ml/hr x 2 hr and stop TPN at 1800 today  Will discontinue TPN labs and CBG checks  Plan is to go home tomorrow  Reuel Boom, PharmD, BCPS [phone] 06/18/2017, 9:34 AM

## 2017-06-18 NOTE — Progress Notes (Signed)
Calorie Count Note  MD ordered Calorie Count x 48 hours.   Please hang calorie count envelope on the patient's door. Document percent consumed for each item on the patient's meal tray ticket and keep in envelope. Also document percent of any supplement or snack pt consumes and keep documentation in envelope for RD to review.  Clayton Bibles, MS, RD, [place] Dietitian Pager: [phone] After Hours Pager: [phone]

## 2017-06-19 LAB — GLUCOSE, CAPILLARY: Glucose-Capillary: 107 mg/dL — ABNORMAL HIGH (ref 65–99)

## 2017-06-19 NOTE — Progress Notes (Signed)
Patient discharge home, instructions reviewed with pt.

## 2017-06-19 NOTE — Discharge Summary (Signed)
Patient ID: Nichalos Brenton MRN: [id number] DOB/AGE: [birth year] 67 y.o.  Admit date: 06/05/2017 Discharge date: 06/19/2017  Discharge Diagnoses Patient Active Problem List   Diagnosis Date Noted  . Hyperlipidemia 06/15/2017  . Elevated transaminase level 06/15/2017  . Colon cancer ([place]) 06/05/2017  . Essential hypertension 03/02/2017  . Preop cardiovascular exam 03/02/2017  . Mixed dyslipidemia 03/02/2017  . Overweight 03/02/2017  . BPH (benign prostatic hyperplasia) 03/06/2016  . Colon polyps 03/06/2016    Consultants Internal medicine for assistance in managing comorbidities  Procedures OR 06/05/17 for laparoscopic subtotal colectomy with ileocolic anastomosis PICC placement for TPN administration  Hospital Course: He was taken to the operating room on 06/05/17 and underwent the above procedures. He developed an expected postoperative ileus. Internal medicine was consulted for assistance in managing comorbidities while NPO. He had an NGT placed for gastric decompression and to help with nausea/vomiting. He was having bowel function throughout this but had issues with PO tolerance. On POD#6 he had a PICC placed for anticipated TPN which was started on POD#7. Slowly over the ensuing week he continued to have bowel function and his NGT was successfully removed. He was advanced to clear liquids which he tolerated and his diet was gently advanced from there. His TPN was weaned 4/25 without issue and he was tolerating a soft diet. On 4/26, he continued to tolerate his diet and have bowel function . His pain was well controlled without narcotic analgesia. He was feeling well and ambulating well on his own. He was deemed stable for discharge home.  Allergies as of 06/19/2017   No Known Allergies     Medication List    TAKE these medications   amLODipine 10 MG tablet Commonly known as:  NORVASC Take 10 mg by mouth daily.   cetirizine 10 MG tablet Commonly known as:  ZYRTEC Take 10 mg  by mouth daily as needed for allergies.   COMBIGAN 0.2-0.5 % ophthalmic solution Generic drug:  brimonidine-timolol Place 1 drop into both eyes every 12 (twelve) hours.   finasteride 5 MG tablet Commonly known as:  PROSCAR Take 1 tablet (5 mg total) by mouth daily.   hydrALAZINE 10 MG tablet Commonly known as:  APRESOLINE Take 10 mg by mouth 3 (three) times daily.   hydrochlorothiazide 25 MG tablet Commonly known as:  HYDRODIURIL Take 25 mg by mouth daily.   ipratropium 0.06 % nasal spray Commonly known as:  ATROVENT Place 1 spray into both nostrils daily as needed for rhinitis.   multivitamin with minerals Tabs tablet Take 1 tablet by mouth daily.   naproxen 500 MG tablet Commonly known as:  NAPROSYN Take 500 mg by mouth daily as needed for moderate pain.   olmesartan 40 MG tablet Commonly known as:  BENICAR Take 40 mg by mouth daily.   rosuvastatin 5 MG tablet Commonly known as:  CRESTOR Take 5 mg by mouth daily at 6 PM.   tamsulosin 0.4 MG Caps capsule Commonly known as:  FLOMAX Take 1 capsule (0.4 mg total) by mouth daily after supper.   VYZULTA 0.024 % Soln Generic drug:  Latanoprostene Bunod Place 1 drop into both eyes every evening.      Sharon Mt. Dema Severin, M.D. [facility]

## 2017-06-19 NOTE — Progress Notes (Signed)
Calorie Count Note  48 hour calorie count ordered. Day 1 results below.  Diet: soft diet Supplements: Boost Breeze po TID, each supplement provides 250 kcal and 9 grams of protein  4/25-4/26: Breakfast (4/26): 345 kcal, 12g protein Lunch: 885 kcal, 20g Dinner: 60 kcal Supplements: 500 kcal, 18g protein  Estimated Nutritional Needs:  Kcal:  2200-2400 kcal/day (MSJ x 1.2-1.3) Protein:  100-115 grams/day  Total intake: 1790 kcal (81% of minimum estimated needs)  50g protein (50% of minimum estimated needs)  Nutrition Dx: Inadequate oral intake related to inability to eat as evidenced by NPO status.  -Improving  Goal: Pt to meet >/= 90% of their estimated nutrition needs   Intervention:  -Continue Boost Breeze po TID, each supplement provides 250 kcal and 9 grams of protein -Calore Count  Clayton Bibles, MS, RD, LDN [place] Dietitian Pager: [phone] After Hours Pager: [phone]

## 2017-06-19 NOTE — Discharge Instructions (Signed)
POST OP INSTRUCTIONS  DIET: As tolerated. Be sure to include lots of fluids daily.  Avoid fast food or heavy meals as your are more likely to get nauseated.   1. Take your usually prescribed home medications unless otherwise directed.  2. PAIN CONTROL: a. Pain is best controlled by a usual combination of three different methods TOGETHER: i. Ice/Heat ii. Over the counter pain medication b. Most patients will experience some swelling and bruising around the surgical site.  Ice packs or heating pads (30-60 minutes up to 6 times a day) will help. Some people prefer to use ice alone, heat alone, alternating between ice & heat.  Experiment to what works for you.  Swelling and bruising can take several weeks to resolve.   c. It is helpful to take an over-the-counter pain medication regularly for the first few weeks: i. Ibuprofen (Motrin/Advil) - 200mg  tabs - take 3 tabs (600mg ) every 6 hours as needed for pain ii. Acetaminophen (Tylenol) - you may take 650mg  every 6 hours as needed. You can take this with motrin as they act differently on the body.  Iii. NOTE: You may take both of these medications together - most patients  find it most helpful when alternating between the two (i.e. Ibuprofen at 6am,  tylenol at 9am, ibuprofen at 12pm ...)  3. Loose stool:You may begin taking a fiber supplement (such as Metamucil, Citrucel, FiberCon, MiraLax, etc) 1-2 times a day regularly will usually help. If after 3-5 days of this there isn't significant improvement, you may also add over the counter Imodium - 1 tablet every 6-8 hours as needed for loose/watery stool  4. Dressing: Your incision is covered in Dermabond which is like sterile superglue for the skin. This will come off on it's own in a couple weeks. It is waterproof and you may bathe normally starting the day after your surgery in a shower. Avoid baths/pools/lakes/oceans until your wounds have fully healed.  5. ACTIVITIES as tolerated:   a. Avoid heavy  lifting (>10lbs or 1 gallon of milk) for the next 6 weeks. b. You may resume regular (light) daily activities beginning the next day--such as daily self-care, walking, climbing stairs--gradually increasing activities as tolerated.  If you can walk 30 minutes without difficulty, it is safe to try more intense activity such as jogging, treadmill, bicycling, low-impact aerobics.  c. DO NOT PUSH THROUGH PAIN.  Let pain be your guide: If it hurts to do something, don't do it. d. Bryan Floyd may drive when you are no longer taking prescription pain medication, you can comfortably wear a seatbelt, and you can safely maneuver your car and apply brakes. e. Bryan Floyd may have sexual intercourse when it is comfortable.   6. FOLLOW UP in our office a. Please call [facility] at [phone] to set up an appointment to see your surgeon in the office for a follow-up appointment approximately 2 weeks after your surgery. b. Make sure that you call for this appointment the day you arrive home to insure a convenient appointment time.  9. If you have disability or family leave forms that need to be completed, you may have them completed by your primary care physician's office; for return to work instructions, please ask our office staff and they will be happy to assist you in obtaining this documentation   When to call us [phone]: 1. Poor pain control 2. Reactions / problems with new medications (rash/itching, etc)  3. Fever over 101.5 F (38.5 C) 4. Inability to  urinate 5. Nausea/vomiting 6. Worsening swelling or bruising 7. Continued bleeding from incision. 8. Increased pain, redness, or drainage from the incision  The clinic staff is available to answer your questions during regular business hours (8:30am-5pm).  Please dont hesitate to call and ask to speak to one of our nurses for clinical concerns.   A surgeon from [facility] is always on call at the hospitals   If you have a medical emergency, go to the  nearest emergency room or call 911.  [facility], [place] [address] [place], [place], [place]  [address] MAIN: [id number] FAX: [phone] [email or web address]

## 2017-06-30 DIAGNOSIS — N400 Enlarged prostate without lower urinary tract symptoms: Secondary | ICD-10-CM | POA: Diagnosis not present

## 2017-06-30 DIAGNOSIS — D649 Anemia, unspecified: Secondary | ICD-10-CM | POA: Diagnosis not present

## 2017-06-30 DIAGNOSIS — I10 Essential (primary) hypertension: Secondary | ICD-10-CM | POA: Diagnosis not present

## 2017-06-30 DIAGNOSIS — Z9049 Acquired absence of other specified parts of digestive tract: Secondary | ICD-10-CM | POA: Diagnosis not present

## 2017-07-01 DIAGNOSIS — N400 Enlarged prostate without lower urinary tract symptoms: Secondary | ICD-10-CM | POA: Diagnosis not present

## 2017-07-13 ENCOUNTER — Telehealth: Payer: Self-pay | Admitting: Genetic Counselor

## 2017-07-13 ENCOUNTER — Encounter: Payer: Self-pay | Admitting: Genetic Counselor

## 2017-07-13 NOTE — Telephone Encounter (Signed)
Genetic counseling referral from Dr. Dema Severin at [place].   A genetic counseling appt has been scheduled for the Bryan Floyd to see Steele Berg on 5/31 at 10:15am. Pt aware to arrive 30 minutes early. Letter mailed.

## 2017-07-24 ENCOUNTER — Inpatient Hospital Stay: Payer: PPO

## 2017-07-24 ENCOUNTER — Encounter: Payer: Self-pay | Admitting: Genetic Counselor

## 2017-07-24 ENCOUNTER — Inpatient Hospital Stay: Payer: PPO | Attending: Genetic Counselor | Admitting: Genetic Counselor

## 2017-07-24 DIAGNOSIS — Z809 Family history of malignant neoplasm, unspecified: Secondary | ICD-10-CM | POA: Diagnosis not present

## 2017-07-24 DIAGNOSIS — C189 Malignant neoplasm of colon, unspecified: Secondary | ICD-10-CM | POA: Diagnosis not present

## 2017-07-24 DIAGNOSIS — K635 Polyp of colon: Secondary | ICD-10-CM | POA: Diagnosis not present

## 2017-07-24 DIAGNOSIS — Z8042 Family history of malignant neoplasm of prostate: Secondary | ICD-10-CM | POA: Diagnosis not present

## 2017-07-24 NOTE — Progress Notes (Signed)
[facility]      Initial Visit   Patient Name: Bryan Floyd Patient DOB: 1950-09-23 Patient Age: 67 y.o. Encounter Date: 07/24/2017  Referring Provider: Nadeen Landau, MD  Primary Care Provider: Harlan Stains, MD  Reason for Visit: Evaluate for hereditary susceptibility to cancer    Assessment and Plan:  . Bryan Floyd history is suggestive of a hereditary predisposition to cancer even though he was not diagnosed with colon cancer until his 49s. He has had multiple colon polyps and a genetics evaluation can help clarify the cause of his history.   . Testing is recommended to determine whether he has a pathogenic mutation that will impact his screening and risk-reduction for cancer.   . Bryan Floyd wished to pursue genetic testing and a blood sample will be sent for analysis of the 83 genes on [facility]'s Multi-Cancer panel (ALK, APC, ATM, AXIN2, BAP1, BARD1, BLM, BMPR1A, BRCA1, BRCA2, BRIP1, CASR, CDC73, CDH1, CDK4, CDKN1B, CDKN1C, CDKN2A, CEBPA, CHEK2, CTNNA1, DICER1, DIS3L2, EGFR, EPCAM, FH, FLCN, GATA2, GPC3, GREM1, HOXB13, HRAS, KIT, MAX, MEN1, MET, MITF, MLH1, MSH2, MSH3, MSH6, MUTYH, NBN, NF1, NF2, NTHL1, PALB2, PDGFRA, PHOX2B, PMS2, POLD1, POLE, POT1, PRKAR1A, PTCH1, PTEN, RAD50, RAD51C, RAD51D, RB1, RECQL4, RET, RUNX1, SDHA, SDHAF2, SDHB, SDHC, SDHD, SMAD4, SMARCA4, SMARCB1, SMARCE1, STK11, SUFU, TERC, TERT, TMEM127, TP53, TSC1, TSC2, VHL, WRN, WT1).   . Results should be available in approximately 2-3 weeks, at which point we will contact him and address implications for him as well as address genetic testing for at-risk family members, if needed.     Dr. Jana Hakim was available for questions concerning this case. Total time spent by me in face-to-face counseling was approximately 30 minutes.   _____________________________________________________________________   History of Present Illness: Bryan Floyd, a 67 y.o. male, is  being seen at the [facility] due to a personal and family history of cancer. He presents to clinic today to discuss the possibility of a hereditary predisposition to cancer and discuss whether genetic testing is warranted.  Bryan Floyd has a history of colon cancer diagnosed at age 30 as well as colon polyps identified since then. In 03/25/16, he was found to have 7 polyps, including one too large to remove at colonoscopy. A carcinoid tumor was also discovered. In 03/2017, and in 05/2017, additional polyps were found; one tubular adenoma had high-grade dysplasia.   Past Medical History:  Diagnosis Date  . Colon cancer ([place])   . Colon polyps   . Family history of prostate cancer   . Glaucoma   . Hypertension      Past Surgical History:  Procedure Laterality Date  . COLONOSCOPY WITH PROPOFOL N/A 03/25/2016   Procedure: COLONOSCOPY WITH PROPOFOL;  Surgeon: Clarene Essex, MD;  Location: [facility];  Service: Endoscopy;  Laterality: N/A;  . COLONOSCOPY WITH PROPOFOL N/A 04/10/2017   Procedure: COLONOSCOPY WITH PROPOFOL;  Surgeon: Ileana Roup, MD;  Location: [facility];  Service: General;  Laterality: N/A;  . CYSTOSCOPY N/A 07/24/2013   Procedure: CYSTOSCOPY;  Surgeon: Festus Aloe, MD;  Location: [facility];  Service: Urology;  Laterality: N/A;  . EYE SURGERY     laser  . HOT HEMOSTASIS N/A 03/25/2016   Procedure: HOT HEMOSTASIS (ARGON PLASMA COAGULATION/BICAP);  Surgeon: Clarene Essex, MD;  Location: [facility];  Service: Endoscopy;  Laterality: N/A;  . LAPAROSCOPIC RIGHT HEMI COLECTOMY Right 06/05/2017   Procedure: LAPAROSCOPIC ASSISTED SUBTOTAL COLECTOMY WITH ILEO DESCENDING ANASTOMOSIS AND TAKEDOWN  OF SPLENIC FLEXURE;  Surgeon: Ileana Roup, MD;  Location: [facility];  Service: General;  Laterality: Right;  . TRANSURETHRAL RESECTION OF PROSTATE N/A 07/24/2013   Procedure: TRANSURETHRAL RESECTION OF THE PROSTATE (TURP), staged;  Surgeon: Festus Aloe,  MD;  Location: [facility];  Service: Urology;  Laterality: N/A;    Social History   Socioeconomic History  . Marital status: Married    Spouse name: Not on file  . Number of children: Not on file  . Years of education: Not on file  . Highest education level: Not on file  Occupational History  . 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 Financial resource strain: Not on file  . Food insecurity:    Worry: Not on file    Inability: Not on file  . Transportation needs:    Medical: Not on file    Non-medical: Not on file  Tobacco Use  . Smoking status: Never Smoker  . Smokeless tobacco: Never Used  Substance and Sexual Activity  . Alcohol use: Yes    Alcohol/week: 0.6 oz    Types: 1 Cans of beer per week    Comment: 6 pack / week  . Drug use: No  . Sexual activity: Not on file  Lifestyle  . Physical activity:    Days per week: Not on file    Minutes per session: Not on file  . Stress: Not on file  Relationships  . Social connections:    Talks on phone: Not on file    Gets together: Not on file    Attends religious service: Not on file    Active member of club or organization: Not on file    Attends meetings of clubs or organizations: Not on file    Relationship status: Not on file  Other Topics Concern  . Not on file  Social History Narrative  . Not on file     Family History:  During the visit, a 4-generation pedigree was obtained. Family tree will be scanned in the Media tab in Epic  Significant diagnoses include the following:  Family History  Problem Relation Age of Onset  . Stroke Father   . Heart disease Father   . Prostate cancer Father        dx 38s; deceased 82  . Lung cancer Paternal Aunt        smoker; deceased 99    Additionally, Bryan Floyd has 2 sons (ages 32 and 66). He has no full siblings. He has a paternal half-sister (age 27). He has no information about his mother or any maternal relatives. His father (noted above) had only one  sister (noted above) who had no children.  Bryan Floyd ancestry is African American. There is no known Jewish ancestry and no consanguinity.  Discussion: We reviewed the characteristics, features and inheritance patterns of hereditary cancer syndromes. We discussed his risk of harboring a mutation in the context of his personal and family history. A risk assessment is challenging because he has no information about his mother or any maternal relatives. We discussed the process of genetic testing, insurance coverage and implications of results: positive, negative and variant of unknown significance (VUS).    Mr. Lukins questions were answered to his satisfaction today and he is welcome to call with any additional questions or concerns. Thank you for the referral and allowing [place] to share in the care of your patient.    Steele Berg, MS, [place] Certified Genetic Counselor phone: [phone]  [email or web address]

## 2017-07-31 ENCOUNTER — Encounter: Payer: Self-pay | Admitting: Genetic Counselor

## 2017-07-31 ENCOUNTER — Ambulatory Visit: Payer: Self-pay | Admitting: Genetic Counselor

## 2017-07-31 DIAGNOSIS — Z1379 Encounter for other screening for genetic and chromosomal anomalies: Secondary | ICD-10-CM

## 2017-07-31 HISTORY — DX: Encounter for other screening for genetic and chromosomal anomalies: Z13.79

## 2017-07-31 NOTE — Progress Notes (Signed)
Bryan Floyd       Genetic Test Results    Patient Name: Bryan Floyd Patient DOB: 1950-06-25 Patient Age: 67 y.o. Encounter Date: 07/31/2017  Referring Provider: Annye English, MD  Primary Care Provider: Harlan Stains, MD   Bryan Floyd was called today to discuss genetic test results. Please see the Genetics note from his visit on 07/24/2017 for a detailed discussion of his personal and family history.  Genetic Testing: At the time of Bryan Floyd visit, he decided to pursue genetic testing of multiple genes associated with hereditary susceptibility to cancer. Testing included sequencing and deletion/duplication analysis. Testing did not reveal a pathogenic mutation in any of the genes analyzed.  A copy of the genetic test report will be scanned into Epic under the Media tab.  The genes analyzed were the 83 genes on [facility]'s Multi-Cancer panel (ALK, APC, ATM, AXIN2, BAP1, BARD1, BLM, BMPR1A, BRCA1, BRCA2, BRIP1, CASR, CDC73, CDH1, CDK4, CDKN1B, CDKN1C, CDKN2A, CEBPA, CHEK2, CTNNA1, DICER1, DIS3L2, EGFR, EPCAM, FH, FLCN, GATA2, GPC3, GREM1, HOXB13, HRAS, KIT, MAX, MEN1, MET, MITF, MLH1, MSH2, MSH3, MSH6, MUTYH, NBN, NF1, NF2, NTHL1, PALB2, PDGFRA, PHOX2B, PMS2, POLD1, POLE, POT1, PRKAR1A, PTCH1, PTEN, RAD50, RAD51C, RAD51D, RB1, RECQL4, RET, RUNX1, SDHA, SDHAF2, SDHB, SDHC, SDHD, SMAD4, SMARCA4, SMARCB1, SMARCE1, STK11, SUFU, TERC, TERT, TMEM127, TP53, TSC1, TSC2, VHL, WRN, WT1).  Since the current test is not perfect, it is possible that there may be a gene mutation that current testing cannot detect, but that chance is small. It is possible that a different genetic factor, which has not yet been discovered or is not on this panel, is responsible for his history of polyps and colon cancer. No additional testing is recommended at this time for Bryan Floyd.  A Variant of Uncertain Significance was detected: APC c.5510G>A (p.[id number]). This is still  considered a normal result. While at this time, it is unknown if this finding is associated with increased cancer risk, the majority of these variants get reclassified to be inconsequential. Medical management should not be based on this finding. With time, we suspect the lab will determine the significance, if any. If we do learn more about it, we will try to contact Bryan Floyd to discuss it further. It is important to stay in touch with [place] periodically and keep the address and phone number up to date.  Cancer Screening:  Bryan Floyd is recommended to follow the cancer screening guidelines provided by his physician.   Family Members: Family members are at some increased risk of developing cancer, over the general population risk, simply due to the family history. They are recommended to speak with their own providers about appropriate cancer screenings.   Any relative who had cancer at a young age or had a particularly rare cancer may also wish to pursue genetic testing. Genetic counselors can be located in other cities, by visiting the website of the [employer] ([employer]se) and Field seismologist for a Dietitian by zip code.   Family members are not recommended to get tested for the above VUS outside of a research protocol as this finding has no implications for their medical management.    Lastly, cancer genetics is a rapidly advancing field and it is possible that new genetic tests will be appropriate for Bryan Floyd in the future. Bryan Floyd is encouraged to remain in contact with Genetics on an annual basis so we  can update his personal and family histories, and let him know of advances in cancer genetics that may benefit the family. Bryan Floyd questions were answered to his satisfaction today, and he knows he is welcome to call anytime with additional questions.     Steele Berg, MS, [place] Certified Genetic Counselor phone: [phone]

## 2017-09-23 DIAGNOSIS — H2513 Age-related nuclear cataract, bilateral: Secondary | ICD-10-CM | POA: Diagnosis not present

## 2017-09-23 DIAGNOSIS — H401132 Primary open-angle glaucoma, bilateral, moderate stage: Secondary | ICD-10-CM | POA: Diagnosis not present

## 2017-09-23 DIAGNOSIS — H04123 Dry eye syndrome of bilateral lacrimal glands: Secondary | ICD-10-CM | POA: Diagnosis not present

## 2017-11-10 ENCOUNTER — Other Ambulatory Visit: Payer: Self-pay | Admitting: Family Medicine

## 2017-11-10 DIAGNOSIS — E042 Nontoxic multinodular goiter: Secondary | ICD-10-CM

## 2017-11-11 ENCOUNTER — Ambulatory Visit
Admission: RE | Admit: 2017-11-11 | Discharge: 2017-11-11 | Disposition: A | Discharge status: Home / Self Care | Payer: PPO | Source: Ambulatory Visit | Attending: Family Medicine | Admitting: Family Medicine

## 2017-11-11 DIAGNOSIS — E042 Nontoxic multinodular goiter: Secondary | ICD-10-CM | POA: Diagnosis not present

## 2017-11-16 ENCOUNTER — Other Ambulatory Visit: Payer: Self-pay | Admitting: Family Medicine

## 2017-11-16 DIAGNOSIS — E042 Nontoxic multinodular goiter: Secondary | ICD-10-CM

## 2017-11-25 ENCOUNTER — Other Ambulatory Visit: Payer: Self-pay | Admitting: Family Medicine

## 2017-11-25 ENCOUNTER — Ambulatory Visit
Admission: RE | Admit: 2017-11-25 | Discharge: 2017-11-25 | Disposition: A | Discharge status: Home / Self Care | Payer: PPO | Source: Ambulatory Visit | Attending: Family Medicine | Admitting: Family Medicine

## 2017-11-25 DIAGNOSIS — E042 Nontoxic multinodular goiter: Secondary | ICD-10-CM | POA: Diagnosis not present

## 2017-12-31 DIAGNOSIS — N400 Enlarged prostate without lower urinary tract symptoms: Secondary | ICD-10-CM | POA: Diagnosis not present

## 2018-02-04 DIAGNOSIS — H2513 Age-related nuclear cataract, bilateral: Secondary | ICD-10-CM | POA: Diagnosis not present

## 2018-02-04 DIAGNOSIS — H04123 Dry eye syndrome of bilateral lacrimal glands: Secondary | ICD-10-CM | POA: Diagnosis not present

## 2018-02-12 DIAGNOSIS — H2513 Age-related nuclear cataract, bilateral: Secondary | ICD-10-CM | POA: Diagnosis not present

## 2018-02-12 DIAGNOSIS — H04123 Dry eye syndrome of bilateral lacrimal glands: Secondary | ICD-10-CM | POA: Diagnosis not present

## 2018-02-23 DIAGNOSIS — M25512 Pain in left shoulder: Secondary | ICD-10-CM | POA: Diagnosis not present

## 2018-03-02 DIAGNOSIS — H401132 Primary open-angle glaucoma, bilateral, moderate stage: Secondary | ICD-10-CM | POA: Diagnosis not present

## 2018-03-02 DIAGNOSIS — H04123 Dry eye syndrome of bilateral lacrimal glands: Secondary | ICD-10-CM | POA: Diagnosis not present

## 2018-03-02 DIAGNOSIS — H2513 Age-related nuclear cataract, bilateral: Secondary | ICD-10-CM | POA: Diagnosis not present

## 2018-03-04 DIAGNOSIS — S43122A Dislocation of left acromioclavicular joint, 100%-200% displacement, initial encounter: Secondary | ICD-10-CM | POA: Diagnosis not present

## 2018-03-04 DIAGNOSIS — M25512 Pain in left shoulder: Secondary | ICD-10-CM | POA: Insufficient documentation

## 2018-03-04 DIAGNOSIS — S43109A Unspecified dislocation of unspecified acromioclavicular joint, initial encounter: Secondary | ICD-10-CM | POA: Insufficient documentation

## 2018-03-17 DIAGNOSIS — L602 Onychogryphosis: Secondary | ICD-10-CM | POA: Diagnosis not present

## 2018-03-17 DIAGNOSIS — E041 Nontoxic single thyroid nodule: Secondary | ICD-10-CM | POA: Diagnosis not present

## 2018-03-17 DIAGNOSIS — I10 Essential (primary) hypertension: Secondary | ICD-10-CM | POA: Diagnosis not present

## 2018-03-17 DIAGNOSIS — M25512 Pain in left shoulder: Secondary | ICD-10-CM | POA: Diagnosis not present

## 2018-03-17 DIAGNOSIS — E785 Hyperlipidemia, unspecified: Secondary | ICD-10-CM | POA: Diagnosis not present

## 2018-03-24 ENCOUNTER — Ambulatory Visit: Payer: PPO | Admitting: Podiatry

## 2018-03-24 ENCOUNTER — Encounter: Payer: Self-pay | Admitting: Podiatry

## 2018-03-24 VITALS — BP 165/92 | HR 86 | Resp 16

## 2018-03-24 DIAGNOSIS — M79609 Pain in unspecified limb: Secondary | ICD-10-CM

## 2018-03-24 DIAGNOSIS — L600 Ingrowing nail: Secondary | ICD-10-CM | POA: Diagnosis not present

## 2018-03-24 DIAGNOSIS — B351 Tinea unguium: Secondary | ICD-10-CM

## 2018-03-24 NOTE — Progress Notes (Signed)
   Subjective:    Patient ID: Bryan Floyd, male    DOB: 1950-06-01, 68 y.o.   MRN: [id number]  HPI    Review of Systems  All other systems reviewed and are negative.      Objective:   Physical Exam        Assessment & Plan:

## 2018-03-25 NOTE — Progress Notes (Signed)
Subjective:   Patient ID: Bryan Floyd, male   DOB: 68 y.o.   MRN: [id number]   HPI Patient presents stating he has had long thick nailbeds for a long time and they are increasingly difficult for him to cut and increasingly becoming more sore when he tries to wear shoe gear.  States that he has tried different options without relief and patient does not smoke and likes to be active   Review of Systems  All other systems reviewed and are negative.       Objective:  Physical Exam Vitals signs and nursing note reviewed.  Constitutional:      Appearance: He is well-developed.  Pulmonary:     Effort: Pulmonary effort is normal.  Musculoskeletal: Normal range of motion.  Skin:    General: Skin is warm.  Neurological:     Mental Status: He is alert.     Neurovascular status intact muscle strength is adequate range of motion within normal limits with patient found to have thick dystrophic nailbeds 1-5 both feet that are painful and patient cannot cut.  Patient states that he is tried treatment options of topical without relief.  Patient does have good digital flow and was found to be well oriented     Assessment:  Chronic mycotic nail infection with pain 1-5 both feet with extreme thickness     Plan:  H&P reviewed condition at great length.  I do think aggressive debridement and smoothing is his best option I discussed oral laser which could be done but I do not feel will be successful.  Today debridement was accomplished and patient tolerated this well and will be seen back for routine care in approximately 10 weeks

## 2018-04-16 DIAGNOSIS — S43122D Dislocation of left acromioclavicular joint, 100%-200% displacement, subsequent encounter: Secondary | ICD-10-CM | POA: Diagnosis not present

## 2018-04-16 DIAGNOSIS — M25512 Pain in left shoulder: Secondary | ICD-10-CM | POA: Diagnosis not present

## 2018-05-17 DIAGNOSIS — Z79899 Other long term (current) drug therapy: Secondary | ICD-10-CM | POA: Diagnosis not present

## 2018-05-17 DIAGNOSIS — E785 Hyperlipidemia, unspecified: Secondary | ICD-10-CM | POA: Diagnosis not present

## 2018-06-02 ENCOUNTER — Ambulatory Visit: Payer: PPO | Admitting: Podiatry

## 2018-06-24 ENCOUNTER — Encounter: Payer: Self-pay | Admitting: Podiatry

## 2018-06-24 ENCOUNTER — Other Ambulatory Visit: Payer: Self-pay

## 2018-06-24 ENCOUNTER — Ambulatory Visit: Payer: PPO | Admitting: Podiatry

## 2018-06-24 VITALS — Temp 97.5°F

## 2018-06-24 DIAGNOSIS — B351 Tinea unguium: Secondary | ICD-10-CM

## 2018-06-24 DIAGNOSIS — M79675 Pain in left toe(s): Secondary | ICD-10-CM

## 2018-06-24 DIAGNOSIS — M79674 Pain in right toe(s): Secondary | ICD-10-CM | POA: Diagnosis not present

## 2018-06-24 NOTE — Patient Instructions (Signed)

## 2018-07-01 ENCOUNTER — Encounter: Payer: Self-pay | Admitting: Podiatry

## 2018-07-01 NOTE — Progress Notes (Signed)
Subjective: Bryan Floyd presents today with painful, thick toenails 1-5 b/l that he cannot cut and which interfere with daily activities.  Pain is aggravated when wearing enclosed shoe gear.  Harlan Stains, MD is his PCP.   Current Outpatient Medications:  .  amLODipine (NORVASC) 10 MG tablet, Take 10 mg by mouth daily., Disp: , Rfl:  .  brimonidine-timolol (COMBIGAN) 0.2-0.5 % ophthalmic solution, Place 1 drop into both eyes every 12 (twelve) hours., Disp: , Rfl:  .  cetirizine (ZYRTEC) 10 MG tablet, Take 10 mg by mouth daily as needed for allergies. , Disp: , Rfl:  .  CYCLOBENZAPRINE HCL PO, Take 10 mg by mouth 3 (three) times daily., Disp: , Rfl:  .  finasteride (PROSCAR) 5 MG tablet, Take 1 tablet (5 mg total) by mouth daily., Disp: 30 tablet, Rfl: 0 .  hydrALAZINE (APRESOLINE) 25 MG tablet, Take 25 mg by mouth 3 (three) times daily., Disp: , Rfl:  .  hydrochlorothiazide (HYDRODIURIL) 25 MG tablet, Take 25 mg by mouth daily. , Disp: , Rfl:  .  ipratropium (ATROVENT) 0.06 % nasal spray, Place 1 spray into both nostrils daily as needed for rhinitis. , Disp: , Rfl:  .  Multiple Vitamin (MULTIVITAMIN WITH MINERALS) TABS tablet, Take 1 tablet by mouth daily. Takes Centrum daily, Disp: , Rfl:  .  naproxen (NAPROSYN) 500 MG tablet, Take 500 mg by mouth daily as needed for moderate pain. , Disp: , Rfl:  .  olmesartan (BENICAR) 40 MG tablet, Take 40 mg by mouth daily. , Disp: , Rfl:  .  rosuvastatin (CRESTOR) 5 MG tablet, Take 5 mg by mouth daily at 6 PM. , Disp: , Rfl:  .  tamsulosin (FLOMAX) 0.4 MG CAPS capsule, Take 1 capsule (0.4 mg total) by mouth daily after supper., Disp: 30 capsule, Rfl: 0 .  VYZULTA 0.024 % SOLN, Place 1 drop into both eyes every evening. , Disp: , Rfl:   Allergies  Allergen Reactions  . Atenolol     Fatigue  . Pravastatin     myalgia    Objective:  Vascular Examination: Capillary refill time immediate x 10 digits.  Dorsalis pedis and Posterior tibial  pulses palpable b/l  Digital hair sparse x 10 digits.  Skin temperature gradient WNL b/l  Dermatological Examination: Skin with normal turgor, texture and tone b/l  Toenails 1-5 b/l discolored, thick, dystrophic with subungual debris and pain with palpation to nailbeds due to thickness of nails.  Musculoskeletal: Muscle strength 5/5 to all LE muscle groups  No gross bony deformities b/l.  No pain, crepitus or joint limitation noted with ROM.   Neurological: Sensation intact with 10 gram monofilament.  Vibratory sensation intact.  Assessment: Painful onychomycosis toenails 1-5 b/l   Plan: 1. Toenails 1-5 b/l were debrided in length and girth without iatrogenic bleeding. 2. Patient to continue soft, supportive shoe gear daily. 3. Patient to report any pedal injuries to medical professional immediately. 4. Follow up 3 months.  5. Patient/POA to call should there be a concern in the interim.

## 2018-07-23 DIAGNOSIS — E785 Hyperlipidemia, unspecified: Secondary | ICD-10-CM | POA: Diagnosis not present

## 2018-07-23 DIAGNOSIS — N400 Enlarged prostate without lower urinary tract symptoms: Secondary | ICD-10-CM | POA: Diagnosis not present

## 2018-07-23 DIAGNOSIS — I10 Essential (primary) hypertension: Secondary | ICD-10-CM | POA: Diagnosis not present

## 2018-09-14 DIAGNOSIS — M545 Low back pain: Secondary | ICD-10-CM | POA: Diagnosis not present

## 2018-09-16 DIAGNOSIS — K5730 Diverticulosis of large intestine without perforation or abscess without bleeding: Secondary | ICD-10-CM | POA: Diagnosis not present

## 2018-09-16 DIAGNOSIS — Z980 Intestinal bypass and anastomosis status: Secondary | ICD-10-CM | POA: Diagnosis not present

## 2018-09-16 DIAGNOSIS — D128 Benign neoplasm of rectum: Secondary | ICD-10-CM | POA: Diagnosis not present

## 2018-09-16 DIAGNOSIS — Z86010 Personal history of colonic polyps: Secondary | ICD-10-CM | POA: Diagnosis not present

## 2018-09-20 DIAGNOSIS — D128 Benign neoplasm of rectum: Secondary | ICD-10-CM | POA: Diagnosis not present

## 2018-09-27 ENCOUNTER — Ambulatory Visit: Payer: PPO | Admitting: Podiatry

## 2018-10-12 ENCOUNTER — Encounter: Payer: Self-pay | Admitting: Podiatry

## 2018-10-12 ENCOUNTER — Ambulatory Visit (INDEPENDENT_AMBULATORY_CARE_PROVIDER_SITE_OTHER): Payer: PPO | Admitting: Podiatry

## 2018-10-12 ENCOUNTER — Other Ambulatory Visit: Payer: Self-pay

## 2018-10-12 VITALS — Temp 98.0°F

## 2018-10-12 DIAGNOSIS — M79674 Pain in right toe(s): Secondary | ICD-10-CM | POA: Diagnosis not present

## 2018-10-12 DIAGNOSIS — M79675 Pain in left toe(s): Secondary | ICD-10-CM

## 2018-10-12 DIAGNOSIS — B351 Tinea unguium: Secondary | ICD-10-CM

## 2018-10-12 NOTE — Patient Instructions (Signed)

## 2018-10-21 NOTE — Progress Notes (Signed)
Subjective:  Bryan Floyd presents to clinic today with cc of  painful, thick, discolored, elongated toenails 1-5 b/l that become tender and cannot cut because of thickness.  Pain is aggravated when wearing enclosed shoe gear.   Current Outpatient Medications:  .  amLODipine (NORVASC) 10 MG tablet, Take 10 mg by mouth daily., Disp: , Rfl:  .  brimonidine-timolol (COMBIGAN) 0.2-0.5 % ophthalmic solution, Place 1 drop into both eyes every 12 (twelve) hours., Disp: , Rfl:  .  cetirizine (ZYRTEC) 10 MG tablet, Take 10 mg by mouth daily as needed for allergies. , Disp: , Rfl:  .  cyclobenzaprine (FLEXERIL) 10 MG tablet, Take 10 mg by mouth 3 (three) times daily as needed., Disp: , Rfl:  .  CYCLOBENZAPRINE HCL PO, Take 10 mg by mouth 3 (three) times daily., Disp: , Rfl:  .  finasteride (PROSCAR) 5 MG tablet, Take 1 tablet (5 mg total) by mouth daily., Disp: 30 tablet, Rfl: 0 .  hydrALAZINE (APRESOLINE) 25 MG tablet, Take 25 mg by mouth 3 (three) times daily., Disp: , Rfl:  .  hydrochlorothiazide (HYDRODIURIL) 25 MG tablet, Take 25 mg by mouth daily. , Disp: , Rfl:  .  ipratropium (ATROVENT) 0.06 % nasal spray, Place 1 spray into both nostrils daily as needed for rhinitis. , Disp: , Rfl:  .  Multiple Vitamin (MULTIVITAMIN WITH MINERALS) TABS tablet, Take 1 tablet by mouth daily. Takes Centrum daily, Disp: , Rfl:  .  naproxen (NAPROSYN) 500 MG tablet, Take 500 mg by mouth daily as needed for moderate pain. , Disp: , Rfl:  .  olmesartan (BENICAR) 40 MG tablet, Take 40 mg by mouth daily. , Disp: , Rfl:  .  rosuvastatin (CRESTOR) 5 MG tablet, Take 5 mg by mouth daily at 6 PM. , Disp: , Rfl:  .  tamsulosin (FLOMAX) 0.4 MG CAPS capsule, Take 1 capsule (0.4 mg total) by mouth daily after supper., Disp: 30 capsule, Rfl: 0 .  VYZULTA 0.024 % SOLN, Place 1 drop into both eyes every evening. , Disp: , Rfl:    Allergies  Allergen Reactions  . Atenolol     Fatigue  . Pravastatin     myalgia      Objective: Vitals:   10/12/18 0830  Temp: 98 F (36.7 C)    Physical Examination:  Vascular Examination: Capillary refill time immediate x 10 digits.  Palpable DP/PT pulses b/l.  Digital hair sparse bilaterally.  No edema noted b/l.  Skin temperature gradient WNL b/l.  Dermatological Examination: Skin with normal turgor, texture and tone b/l.  No open wounds b/l.  No interdigital macerations noted b/l.  Elongated, thick, discolored brittle toenails with subungual debris and pain on dorsal palpation of nailbeds 1-5 b/l.  Musculoskeletal Examination: Muscle strength 5/5 to all muscle groups b/l.  No pain with calf compression bilaterally  No pain, crepitus or joint discomfort with active/passive ROM.  Neurological Examination: Sensation intact 5/5 b/l with 10 gram monofilament.  Vibratory sensation intact b/l.  Proprioceptive sensation intact b/l.  Assessment: Mycotic nail infection with pain 1-5 b/l  Plan: 1. Toenails 1-5 b/l were debrided in length and girth without iatrogenic laceration. 2.  Continue soft, supportive shoe gear daily. 3.  Report any pedal injuries to medical professional. 4.  Follow up 3 months. 5.  Patient/POA to call should there be a question/concern in there interim.

## 2018-10-25 DIAGNOSIS — E785 Hyperlipidemia, unspecified: Secondary | ICD-10-CM | POA: Diagnosis not present

## 2018-10-25 DIAGNOSIS — I10 Essential (primary) hypertension: Secondary | ICD-10-CM | POA: Diagnosis not present

## 2018-10-25 DIAGNOSIS — N400 Enlarged prostate without lower urinary tract symptoms: Secondary | ICD-10-CM | POA: Diagnosis not present

## 2018-11-10 DIAGNOSIS — I10 Essential (primary) hypertension: Secondary | ICD-10-CM | POA: Diagnosis not present

## 2018-11-10 DIAGNOSIS — N400 Enlarged prostate without lower urinary tract symptoms: Secondary | ICD-10-CM | POA: Diagnosis not present

## 2018-11-10 DIAGNOSIS — E785 Hyperlipidemia, unspecified: Secondary | ICD-10-CM | POA: Diagnosis not present

## 2018-11-11 ENCOUNTER — Other Ambulatory Visit: Payer: Self-pay | Admitting: Family Medicine

## 2018-11-11 DIAGNOSIS — E041 Nontoxic single thyroid nodule: Secondary | ICD-10-CM

## 2018-11-18 ENCOUNTER — Ambulatory Visit
Admission: RE | Admit: 2018-11-18 | Discharge: 2018-11-18 | Disposition: A | Discharge status: Home / Self Care | Payer: PPO | Source: Ambulatory Visit | Attending: Family Medicine | Admitting: Family Medicine

## 2018-11-18 DIAGNOSIS — E041 Nontoxic single thyroid nodule: Secondary | ICD-10-CM

## 2018-12-27 DIAGNOSIS — N400 Enlarged prostate without lower urinary tract symptoms: Secondary | ICD-10-CM | POA: Diagnosis not present

## 2019-01-03 DIAGNOSIS — N400 Enlarged prostate without lower urinary tract symptoms: Secondary | ICD-10-CM | POA: Diagnosis not present

## 2019-01-12 ENCOUNTER — Other Ambulatory Visit: Payer: Self-pay

## 2019-01-12 ENCOUNTER — Encounter: Payer: Self-pay | Admitting: Podiatry

## 2019-01-12 ENCOUNTER — Ambulatory Visit (INDEPENDENT_AMBULATORY_CARE_PROVIDER_SITE_OTHER): Payer: PPO | Admitting: Podiatry

## 2019-01-12 DIAGNOSIS — B351 Tinea unguium: Secondary | ICD-10-CM

## 2019-01-12 DIAGNOSIS — M79675 Pain in left toe(s): Secondary | ICD-10-CM

## 2019-01-12 DIAGNOSIS — M79674 Pain in right toe(s): Secondary | ICD-10-CM | POA: Diagnosis not present

## 2019-01-12 NOTE — Patient Instructions (Signed)

## 2019-01-19 NOTE — Progress Notes (Signed)
Subjective: Bryan Floyd is seen today for follow up painful, elongated, thickened toenails bilateral feet that he cannot cut. Pain interferes with daily activities. Aggravating factor includes wearing enclosed shoe gear and relieved with periodic debridement.  Medications reviewed in chart.  Allergies  Allergen Reactions  . Atenolol     Fatigue  . Pravastatin     myalgia     Objective:  Vascular Examination: Capillary refill time immediate b/l.  Dorsalis pedis present b/l.  Posterior tibial pulses present b/l.  Digital hair sparse b/l.  Skin temperature gradient WNL b/l.   Dermatological Examination: Skin with normal turgor, texture and tone b/l.  Toenails 1-5 b/l discolored, thick, dystrophic with subungual debris and pain with palpation to nailbeds due to thickness of nails.  Musculoskeletal: Muscle strength 5/5 to all LE muscle groups b/l.  No gross bony deformities b/l.  No pain, crepitus or joint limitation noted with ROM.   Neurological Examination: Protective sensation intact with 10 gram monofilament bilaterally.  Assessment: Painful onychomycosis toenails 1-5 b/l   Plan: 1. Toenails 1-5 b/l were debrided in length and girth without iatrogenic bleeding. 2. Patient to continue soft, supportive shoe gear daily. 3. Patient to report any pedal injuries to medical professional immediately. 4. Follow up 3 months.  5. Patient/POA to call should there be a concern in the interim.

## 2019-02-04 DIAGNOSIS — N400 Enlarged prostate without lower urinary tract symptoms: Secondary | ICD-10-CM | POA: Diagnosis not present

## 2019-02-04 DIAGNOSIS — E785 Hyperlipidemia, unspecified: Secondary | ICD-10-CM | POA: Diagnosis not present

## 2019-02-04 DIAGNOSIS — I10 Essential (primary) hypertension: Secondary | ICD-10-CM | POA: Diagnosis not present

## 2019-04-15 ENCOUNTER — Encounter: Payer: Self-pay | Admitting: Podiatry

## 2019-04-15 ENCOUNTER — Other Ambulatory Visit: Payer: Self-pay

## 2019-04-15 ENCOUNTER — Ambulatory Visit: Payer: PPO | Admitting: Podiatry

## 2019-04-15 DIAGNOSIS — M79675 Pain in left toe(s): Secondary | ICD-10-CM | POA: Diagnosis not present

## 2019-04-15 DIAGNOSIS — M79674 Pain in right toe(s): Secondary | ICD-10-CM | POA: Diagnosis not present

## 2019-04-15 DIAGNOSIS — B353 Tinea pedis: Secondary | ICD-10-CM

## 2019-04-15 DIAGNOSIS — B351 Tinea unguium: Secondary | ICD-10-CM

## 2019-04-15 MED ORDER — CLOTRIMAZOLE 1 % EX CREA: TOPICAL_CREAM | CUTANEOUS | 1 refills | Status: DC

## 2019-04-15 NOTE — Patient Instructions (Signed)

## 2019-04-21 NOTE — Progress Notes (Signed)
Subjective: Bryan Floyd presents today for follow up of painful mycotic nails b/l that are difficult to trim. Pain interferes with ambulation. Aggravating factors include wearing enclosed shoe gear. Pain is relieved with periodic professional debridement.   Allergies  Allergen Reactions  . Atenolol     Fatigue  . Pravastatin     myalgia     Objective: There were no vitals filed for this visit.  Vascular Examination:  Capillary refill time to digits immediate b/l, palpable DP pulses b/l, palpable PT pulses b/l, pedal hair sparse b/l and skin temperature gradient within normal limits b/l  Dermatological Examination: Pedal skin with normal turgor, texture and tone bilaterally, no open wounds bilaterally, no interdigital macerations bilaterally, toenails 1-5 b/l elongated, dystrophic, thickened, crumbly with subungual debris and diffuse scaling noted peripherally and plantarly b/l feet with mild foot odor.  No interdigital macerations.  No blisters, no weeping. No signs of secondary bacterial infection noted  Musculoskeletal: Normal muscle strength 5/5 to all lower extremity muscle groups bilaterally, no gross bony deformities bilaterally and no pain crepitus or joint limitation noted with ROM b/l  Neurological: Protective sensation intact 5/5 intact bilaterally with 10g monofilament b/l and vibratory sensation intact b/l  Assessment: 1. Pain due to onychomycosis of toenails of both feet   2. Tinea pedis of both feet    Plan: -Toenails 1-5 b/l were debrided in length and girth with sterile nail nippers and dremel without iatrogenic bleeding. -For tinea pedis, Rx for Clotrimazole Cream 1% to be applied to both feet once daily for  -Patient to continue soft, supportive shoe gear daily. -Patient to report any pedal injuries to medical professional immediately. -Patient/POA to call should there be question/concern in the interim.  Return in about 3 months (around 07/13/2019) for nail  trim.

## 2019-06-14 DIAGNOSIS — E785 Hyperlipidemia, unspecified: Secondary | ICD-10-CM | POA: Diagnosis not present

## 2019-06-14 DIAGNOSIS — I10 Essential (primary) hypertension: Secondary | ICD-10-CM | POA: Diagnosis not present

## 2019-06-14 DIAGNOSIS — N400 Enlarged prostate without lower urinary tract symptoms: Secondary | ICD-10-CM | POA: Diagnosis not present

## 2019-06-20 IMAGING — DX DG ABDOMEN 1V
2 series · 2 of 2 positions shown · non-contrast
Comparison: CT chest, abdomen and Pelvis 01/30/2017

CLINICAL DATA: 66-year-old male status post NG tube placement.
Postoperative day 5 status post laparoscopic subtotal colectomy with
ileocolic anastomosis.

EXAM:
ABDOMEN - 1 VIEW

[abdomen kub (1 of 2)]
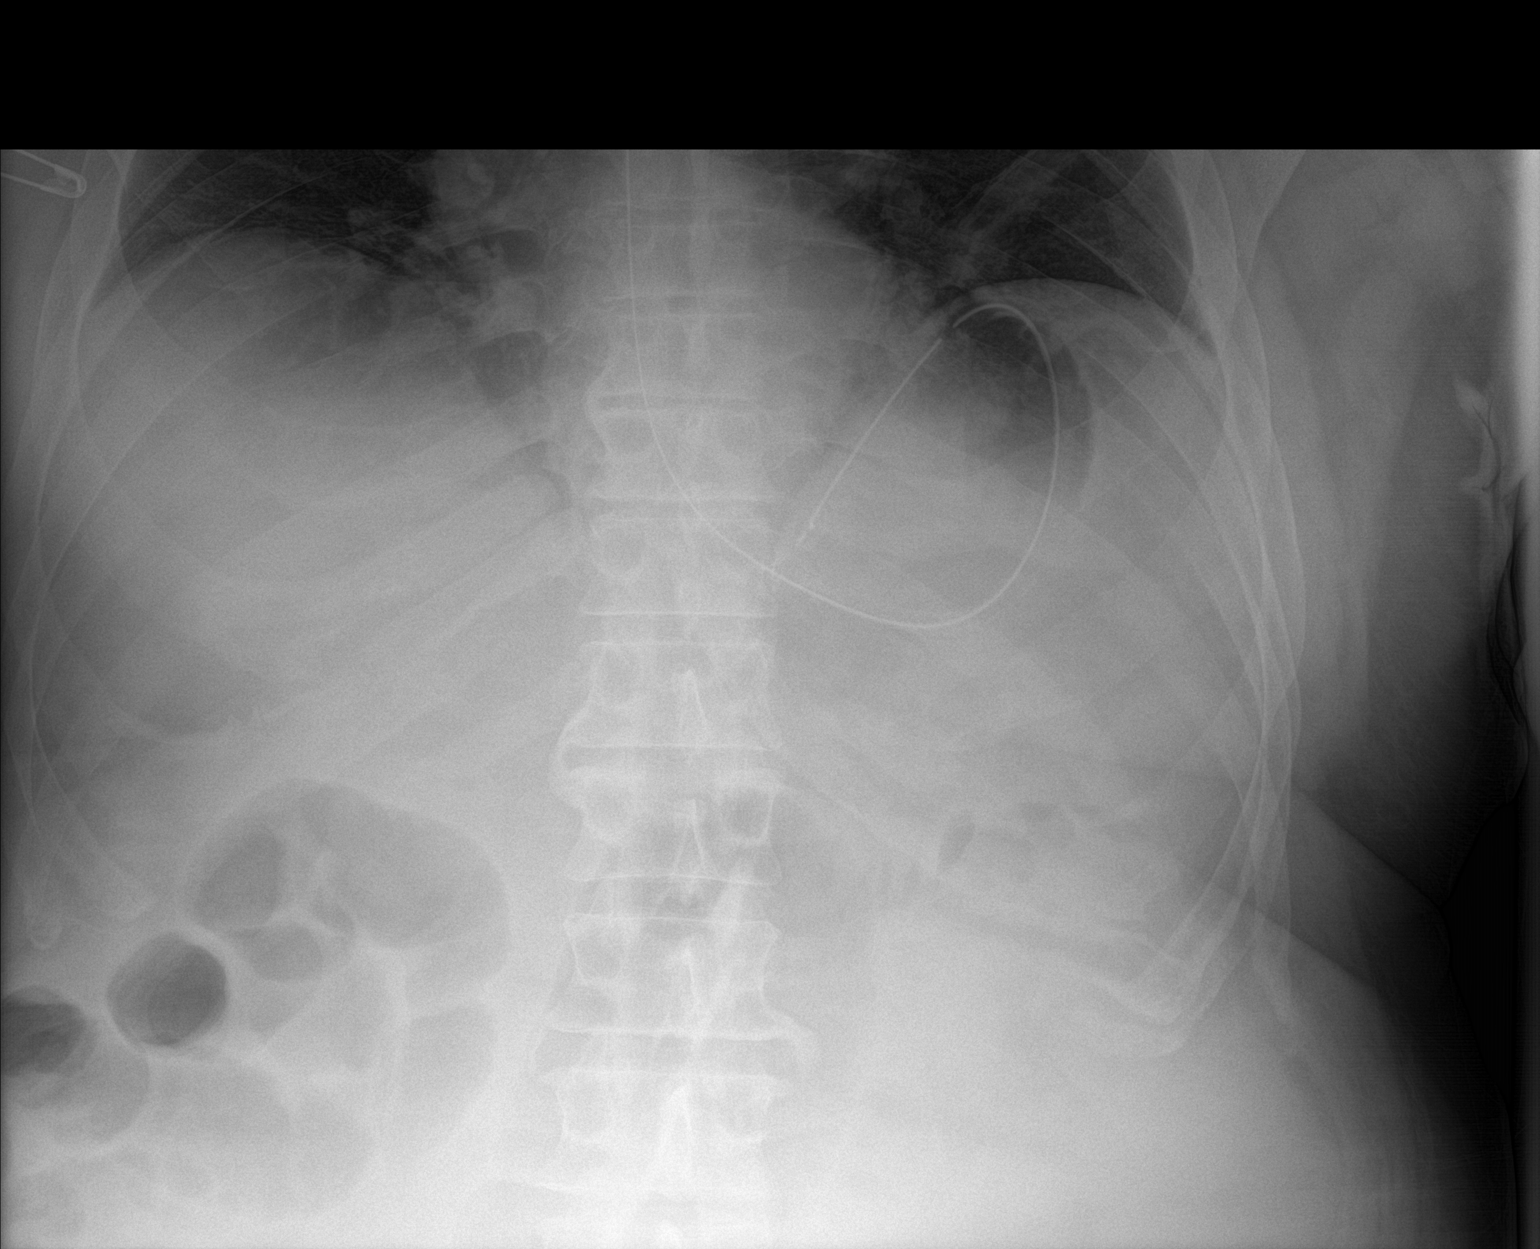

[abdomen kub (2 of 2)]
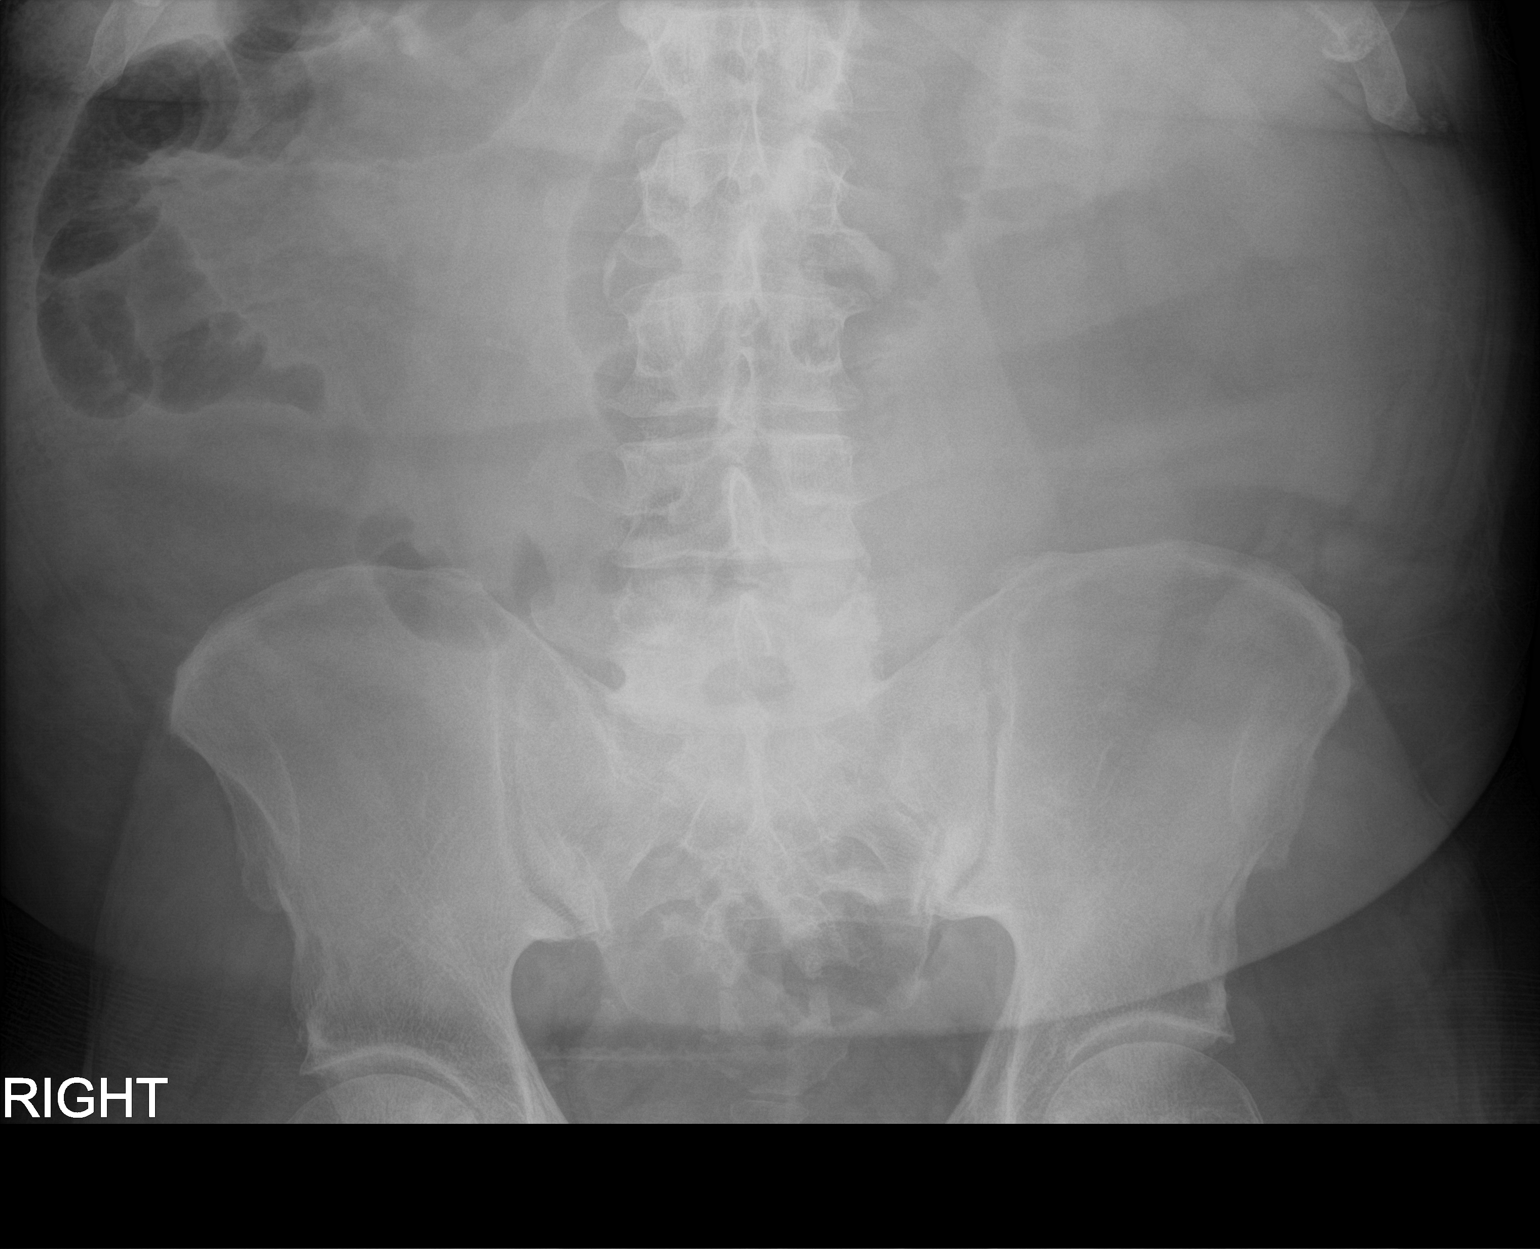

[2 of 2 positions shown; findings below may reference images not displayed]

FINDINGS: Portable AP semi upright view at enteric tube courses to the left
upper quadrant and loops in the proximal stomach, side hole up the
level of the fundus. Gas-filled small bowel loops in the mid and
right abdomen are at the upper limits of normal to mildly dilated.
Paucity of bowel gas elsewhere. Mild lung base atelectasis
suspected. No pneumoperitoneum identified on these supine views. No
acute osseous abnormality identified. Right hip osteoarthritis
versus AVN.
IMPRESSION: 1. NG tube looped in the proximal stomach, side hole the level of
the fundus.
2. Small volume bowel gas overall. Upper limits of normal to mildly
dilated mid and right abdominal loops.

## 2019-07-15 ENCOUNTER — Encounter: Payer: Self-pay | Admitting: Podiatry

## 2019-07-15 ENCOUNTER — Other Ambulatory Visit: Payer: Self-pay

## 2019-07-15 ENCOUNTER — Ambulatory Visit: Payer: PPO | Admitting: Podiatry

## 2019-07-15 DIAGNOSIS — M79675 Pain in left toe(s): Secondary | ICD-10-CM

## 2019-07-15 DIAGNOSIS — B351 Tinea unguium: Secondary | ICD-10-CM

## 2019-07-15 DIAGNOSIS — M79674 Pain in right toe(s): Secondary | ICD-10-CM

## 2019-07-15 NOTE — Patient Instructions (Signed)

## 2019-07-22 NOTE — Progress Notes (Signed)
Subjective: Bryan Floyd is a 69 y.o. male patient seen today painful mycotic nails b/l that are difficult to trim. Pain interferes with ambulation. Aggravating factors include wearing enclosed shoe gear. Pain is relieved with periodic professional debridement  Patient Active Problem List   Diagnosis Date Noted  . Pain in joint of left shoulder 03/04/2018  . Closed traumatic dislocation of acromioclavicular joint 03/04/2018  . Genetic testing 07/31/2017  . Family history of prostate cancer   . Hyperlipidemia 06/15/2017  . Elevated transaminase level 06/15/2017  . Colon cancer ([place]) 06/05/2017  . Essential hypertension 03/02/2017  . Preop cardiovascular exam 03/02/2017  . Mixed dyslipidemia 03/02/2017  . Overweight 03/02/2017  . BPH (benign prostatic hyperplasia) 03/06/2016  . Colon polyps 03/06/2016    Current Outpatient Medications on File Prior to Visit  Medication Sig Dispense Refill  . amLODipine (NORVASC) 10 MG tablet Take 10 mg by mouth daily.    . brimonidine-timolol (COMBIGAN) 0.2-0.5 % ophthalmic solution Place 1 drop into both eyes every 12 (twelve) hours.    . cetirizine (ZYRTEC) 10 MG tablet Take 10 mg by mouth daily as needed for allergies.     . clotrimazole (LOTRIMIN) 1 % cream Apply to both feet and between toes twice daily for 6 weeks. 45 g 1  . cyclobenzaprine (FLEXERIL) 10 MG tablet Take 10 mg by mouth 3 (three) times daily as needed.    . CYCLOBENZAPRINE HCL PO Take 10 mg by mouth 3 (three) times daily.    . finasteride (PROSCAR) 5 MG tablet Take 1 tablet (5 mg total) by mouth daily. 30 tablet 0  . FLUAD QUADRIVALENT 0.5 ML injection     . hydrALAZINE (APRESOLINE) 25 MG tablet Take 25 mg by mouth 3 (three) times daily.    . hydrochlorothiazide (HYDRODIURIL) 25 MG tablet Take 25 mg by mouth daily.     Marland Kitchen ipratropium (ATROVENT) 0.06 % nasal spray Place 1 spray into both nostrils daily as needed for rhinitis.     . Multiple Vitamin (MULTIVITAMIN WITH MINERALS)  TABS tablet Take 1 tablet by mouth daily. [employer] daily    . naproxen (NAPROSYN) 500 MG tablet Take 500 mg by mouth daily as needed for moderate pain.     Marland Kitchen olmesartan (BENICAR) 40 MG tablet Take 40 mg by mouth daily.     . rosuvastatin (CRESTOR) 5 MG tablet Take 5 mg by mouth daily at 6 PM.     . tamsulosin (FLOMAX) 0.4 MG CAPS capsule Take 1 capsule (0.4 mg total) by mouth daily after supper. 30 capsule 0  . Vitamins/Minerals TABS Take by mouth.    . VYZULTA 0.024 % SOLN Place 1 drop into both eyes every evening.      No current facility-administered medications on file prior to visit.    Allergies  Allergen Reactions  . Atenolol     Fatigue  . Pravastatin     myalgia    Objective: Physical Exam  General: Dre Forgacs is a pleasant 69 y.o. African American male, in NAD. AAO x 3.   Vascular:  Neurovascular status unchanged b/l. Capillary refill time to digits immediate b/l. Palpable DP pulses b/l. Palpable PT pulses b/l. Pedal hair present b/l. Skin temperature gradient within normal limits b/l. No edema noted b/l.  Dermatological:  Pedal skin with normal turgor, texture and tone bilaterally. No open wounds bilaterally. No interdigital macerations bilaterally. Toenails 1-5 b/l elongated, dystrophic, thickened, crumbly with subungual debris and tenderness to dorsal palpation. Tinea pedis resoled b/l.  Musculoskeletal:  Normal muscle strength 5/5 to all lower extremity muscle groups bilaterally. No gross bony deformities bilaterally. No pain crepitus or joint limitation noted with ROM b/l. Patient ambulates independent of any assistive aids.  Neurological:  Protective sensation intact 5/5 intact bilaterally with 10g monofilament b/l. Vibratory sensation intact b/l. Proprioception intact bilaterally.  Assessment and Plan:  1. Pain due to onychomycosis of toenails of both feet    -Examined patient. -No new findings. No new orders. -Toenails 1-5 b/l were debrided in  length and girth with sterile nail nippers and dremel without iatrogenic bleeding.  -Patient to continue soft, supportive shoe gear daily. -Patient to report any pedal injuries to medical professional immediately. -Patient/POA to call should there be question/concern in the interim.  Return in about 3 months (around 10/15/2019) for nail trim.  Marzetta Board, DPM

## 2019-08-31 DIAGNOSIS — I10 Essential (primary) hypertension: Secondary | ICD-10-CM | POA: Diagnosis not present

## 2019-08-31 DIAGNOSIS — E785 Hyperlipidemia, unspecified: Secondary | ICD-10-CM | POA: Diagnosis not present

## 2019-08-31 DIAGNOSIS — E041 Nontoxic single thyroid nodule: Secondary | ICD-10-CM | POA: Diagnosis not present

## 2019-09-13 DIAGNOSIS — I10 Essential (primary) hypertension: Secondary | ICD-10-CM | POA: Diagnosis not present

## 2019-09-13 DIAGNOSIS — E785 Hyperlipidemia, unspecified: Secondary | ICD-10-CM | POA: Diagnosis not present

## 2019-09-13 DIAGNOSIS — N400 Enlarged prostate without lower urinary tract symptoms: Secondary | ICD-10-CM | POA: Diagnosis not present

## 2019-10-05 DIAGNOSIS — S8011XA Contusion of right lower leg, initial encounter: Secondary | ICD-10-CM | POA: Diagnosis not present

## 2019-10-05 DIAGNOSIS — I10 Essential (primary) hypertension: Secondary | ICD-10-CM | POA: Diagnosis not present

## 2019-10-17 ENCOUNTER — Other Ambulatory Visit: Payer: Self-pay

## 2019-10-17 ENCOUNTER — Ambulatory Visit: Payer: PPO | Admitting: Podiatry

## 2019-10-17 ENCOUNTER — Encounter: Payer: Self-pay | Admitting: Podiatry

## 2019-10-17 DIAGNOSIS — M79675 Pain in left toe(s): Secondary | ICD-10-CM

## 2019-10-17 DIAGNOSIS — B351 Tinea unguium: Secondary | ICD-10-CM

## 2019-10-17 DIAGNOSIS — M79674 Pain in right toe(s): Secondary | ICD-10-CM

## 2019-10-17 MED ORDER — NONFORMULARY OR COMPOUNDED ITEM: 3 refills | Status: AC

## 2019-10-17 NOTE — Patient Instructions (Signed)
[facility] [phone] Antifungal nail solution 

## 2019-10-20 NOTE — Progress Notes (Signed)
Subjective: Bryan Floyd is a 69 y.o. male patient seen today painful mycotic nails b/l that are difficult to trim. Pain interferes with ambulation. Aggravating factors include wearing enclosed shoe gear. Pain is relieved with periodic professional debridement.  He would like to discuss treatment options for nail fungus on today's visit.  Patient Active Problem List   Diagnosis Date Noted  . Pain in joint of left shoulder 03/04/2018  . Closed traumatic dislocation of acromioclavicular joint 03/04/2018  . Genetic testing 07/31/2017  . Family history of prostate cancer   . Hyperlipidemia 06/15/2017  . Elevated transaminase level 06/15/2017  . Colon cancer ([place]) 06/05/2017  . Essential hypertension 03/02/2017  . Preop cardiovascular exam 03/02/2017  . Mixed dyslipidemia 03/02/2017  . Overweight 03/02/2017  . BPH (benign prostatic hyperplasia) 03/06/2016  . Colon polyps 03/06/2016    Current Outpatient Medications on File Prior to Visit  Medication Sig Dispense Refill  . amLODipine (NORVASC) 10 MG tablet Take 10 mg by mouth daily.    . brimonidine-timolol (COMBIGAN) 0.2-0.5 % ophthalmic solution Place 1 drop into both eyes every 12 (twelve) hours.    . cetirizine (ZYRTEC) 10 MG tablet Take 10 mg by mouth daily as needed for allergies.     . clotrimazole (LOTRIMIN) 1 % cream Apply to both feet and between toes twice daily for 6 weeks. 45 g 1  . cyclobenzaprine (FLEXERIL) 10 MG tablet Take 10 mg by mouth 3 (three) times daily as needed.    . CYCLOBENZAPRINE HCL PO Take 10 mg by mouth 3 (three) times daily.    . finasteride (PROSCAR) 5 MG tablet Take 1 tablet (5 mg total) by mouth daily. 30 tablet 0  . FLUAD QUADRIVALENT 0.5 ML injection     . hydrALAZINE (APRESOLINE) 25 MG tablet Take 25 mg by mouth 3 (three) times daily.    . hydrochlorothiazide (HYDRODIURIL) 25 MG tablet Take 25 mg by mouth daily.     Marland Kitchen ipratropium (ATROVENT) 0.06 % nasal spray Place 1 spray into both nostrils daily  as needed for rhinitis.     . metoprolol succinate (TOPROL-XL) 25 MG 24 hr tablet Take 12.5 mg by mouth daily.    . Multiple Vitamin (MULTIVITAMIN WITH MINERALS) TABS tablet Take 1 tablet by mouth daily. [employer] daily    . naproxen (NAPROSYN) 500 MG tablet Take 500 mg by mouth daily as needed for moderate pain.     Marland Kitchen olmesartan (BENICAR) 40 MG tablet Take 40 mg by mouth daily.     . rosuvastatin (CRESTOR) 5 MG tablet Take 5 mg by mouth daily at 6 PM.     . tamsulosin (FLOMAX) 0.4 MG CAPS capsule Take 1 capsule (0.4 mg total) by mouth daily after supper. 30 capsule 0  . Vitamins/Minerals TABS Take by mouth.    . VYZULTA 0.024 % SOLN Place 1 drop into both eyes every evening.      No current facility-administered medications on file prior to visit.    Allergies  Allergen Reactions  . Atenolol     Fatigue  . Pravastatin     myalgia    Objective: Physical Exam  General: Bryan Floyd is a pleasant 69 y.o. African American male, in NAD. AAO x 3.   Vascular:  Neurovascular status unchanged b/l. Capillary refill time to digits immediate b/l. Palpable DP pulses b/l. Palpable PT pulses b/l. Pedal hair present b/l. Skin temperature gradient within normal limits b/l. No edema noted b/l.  Dermatological:  Pedal skin with normal  turgor, texture and tone bilaterally. No open wounds bilaterally. No interdigital macerations bilaterally. Toenails 1-5 b/l elongated, dystrophic, thickened, crumbly with subungual debris and tenderness to dorsal palpation. Tinea pedis resoled b/l.  Musculoskeletal:  Normal muscle strength 5/5 to all lower extremity muscle groups bilaterally. No gross bony deformities bilaterally. No pain crepitus or joint limitation noted with ROM b/l. Patient ambulates independent of any assistive aids.  Neurological:  Protective sensation intact 5/5 intact bilaterally with 10g monofilament b/l. Vibratory sensation intact b/l. Proprioception intact bilaterally.  Assessment  and Plan:  1. Pain due to onychomycosis of toenails of both feet    -Examined patient. -No new findings. No new orders. -Toenails 1-5 b/l were debrided in length and girth with sterile nail nippers and dremel without iatrogenic bleeding. Discussed topical, laser and oral medication. Patient opted for topical treatment with compounded medication. Rx written for nonformulary compounding topical antifungal: [facility]: Antifungal cream - Terbinafine 3%, Fluconazole 2%, Tea Tree Oil 5%, Urea 10%, Ibuprofen 2% in DMSO Suspension #63ml. Apply to the affected nail(s) at bedtime. -Patient to report any pedal injuries to medical professional immediately. -Patient to continue soft, supportive shoe gear daily. -Patient/POA to call should there be question/concern in the interim.  Return in about 3 months (around 01/17/2020) for nail trim.  Marzetta Board, DPM

## 2020-01-16 DIAGNOSIS — N400 Enlarged prostate without lower urinary tract symptoms: Secondary | ICD-10-CM | POA: Diagnosis not present

## 2020-01-16 DIAGNOSIS — I10 Essential (primary) hypertension: Secondary | ICD-10-CM | POA: Diagnosis not present

## 2020-01-16 DIAGNOSIS — E785 Hyperlipidemia, unspecified: Secondary | ICD-10-CM | POA: Diagnosis not present

## 2020-01-18 ENCOUNTER — Ambulatory Visit: Payer: PPO | Admitting: Podiatry

## 2020-02-08 DIAGNOSIS — H401132 Primary open-angle glaucoma, bilateral, moderate stage: Secondary | ICD-10-CM | POA: Diagnosis not present

## 2020-02-08 DIAGNOSIS — H2513 Age-related nuclear cataract, bilateral: Secondary | ICD-10-CM | POA: Diagnosis not present

## 2020-02-08 DIAGNOSIS — H04123 Dry eye syndrome of bilateral lacrimal glands: Secondary | ICD-10-CM | POA: Diagnosis not present

## 2020-02-09 DIAGNOSIS — H04123 Dry eye syndrome of bilateral lacrimal glands: Secondary | ICD-10-CM | POA: Diagnosis not present

## 2020-02-09 DIAGNOSIS — H2513 Age-related nuclear cataract, bilateral: Secondary | ICD-10-CM | POA: Diagnosis not present

## 2020-02-13 ENCOUNTER — Ambulatory Visit: Payer: PPO | Admitting: Podiatry

## 2020-02-13 DIAGNOSIS — H401133 Primary open-angle glaucoma, bilateral, severe stage: Secondary | ICD-10-CM | POA: Diagnosis not present

## 2020-02-13 DIAGNOSIS — H401123 Primary open-angle glaucoma, left eye, severe stage: Secondary | ICD-10-CM | POA: Diagnosis not present

## 2020-02-14 DIAGNOSIS — H401123 Primary open-angle glaucoma, left eye, severe stage: Secondary | ICD-10-CM | POA: Diagnosis not present

## 2020-02-15 DIAGNOSIS — H401123 Primary open-angle glaucoma, left eye, severe stage: Secondary | ICD-10-CM | POA: Diagnosis not present

## 2020-02-21 DIAGNOSIS — N400 Enlarged prostate without lower urinary tract symptoms: Secondary | ICD-10-CM | POA: Diagnosis not present

## 2020-02-21 DIAGNOSIS — E785 Hyperlipidemia, unspecified: Secondary | ICD-10-CM | POA: Diagnosis not present

## 2020-02-21 DIAGNOSIS — I10 Essential (primary) hypertension: Secondary | ICD-10-CM | POA: Diagnosis not present

## 2020-02-21 DIAGNOSIS — H44412 Flat anterior chamber hypotony of left eye: Secondary | ICD-10-CM | POA: Diagnosis not present

## 2020-02-22 DIAGNOSIS — H04123 Dry eye syndrome of bilateral lacrimal glands: Secondary | ICD-10-CM | POA: Diagnosis not present

## 2020-02-22 DIAGNOSIS — H2513 Age-related nuclear cataract, bilateral: Secondary | ICD-10-CM | POA: Diagnosis not present

## 2020-02-22 DIAGNOSIS — H401132 Primary open-angle glaucoma, bilateral, moderate stage: Secondary | ICD-10-CM | POA: Diagnosis not present

## 2020-02-22 DIAGNOSIS — H401123 Primary open-angle glaucoma, left eye, severe stage: Secondary | ICD-10-CM | POA: Diagnosis not present

## 2020-02-28 DIAGNOSIS — H2513 Age-related nuclear cataract, bilateral: Secondary | ICD-10-CM | POA: Diagnosis not present

## 2020-02-28 DIAGNOSIS — H04123 Dry eye syndrome of bilateral lacrimal glands: Secondary | ICD-10-CM | POA: Diagnosis not present

## 2020-02-28 DIAGNOSIS — H401123 Primary open-angle glaucoma, left eye, severe stage: Secondary | ICD-10-CM | POA: Diagnosis not present

## 2020-02-28 DIAGNOSIS — H401132 Primary open-angle glaucoma, bilateral, moderate stage: Secondary | ICD-10-CM | POA: Diagnosis not present

## 2020-03-14 DIAGNOSIS — H04123 Dry eye syndrome of bilateral lacrimal glands: Secondary | ICD-10-CM | POA: Diagnosis not present

## 2020-03-14 DIAGNOSIS — H2513 Age-related nuclear cataract, bilateral: Secondary | ICD-10-CM | POA: Diagnosis not present

## 2020-03-14 DIAGNOSIS — H401123 Primary open-angle glaucoma, left eye, severe stage: Secondary | ICD-10-CM | POA: Diagnosis not present

## 2020-03-14 DIAGNOSIS — H401132 Primary open-angle glaucoma, bilateral, moderate stage: Secondary | ICD-10-CM | POA: Diagnosis not present

## 2020-03-23 ENCOUNTER — Other Ambulatory Visit: Payer: PPO

## 2020-03-23 ENCOUNTER — Other Ambulatory Visit: Payer: Self-pay

## 2020-03-23 DIAGNOSIS — Z20822 Contact with and (suspected) exposure to covid-19: Secondary | ICD-10-CM | POA: Diagnosis not present

## 2020-03-24 LAB — SARS-COV-2, NAA 2 DAY TAT

## 2020-03-24 LAB — NOVEL CORONAVIRUS, NAA: SARS-CoV-2, NAA: NOT DETECTED

## 2020-03-26 DIAGNOSIS — N401 Enlarged prostate with lower urinary tract symptoms: Secondary | ICD-10-CM | POA: Diagnosis not present

## 2020-03-26 DIAGNOSIS — R350 Frequency of micturition: Secondary | ICD-10-CM | POA: Diagnosis not present

## 2020-04-03 DIAGNOSIS — H04123 Dry eye syndrome of bilateral lacrimal glands: Secondary | ICD-10-CM | POA: Diagnosis not present

## 2020-04-03 DIAGNOSIS — H401132 Primary open-angle glaucoma, bilateral, moderate stage: Secondary | ICD-10-CM | POA: Diagnosis not present

## 2020-04-03 DIAGNOSIS — H401123 Primary open-angle glaucoma, left eye, severe stage: Secondary | ICD-10-CM | POA: Diagnosis not present

## 2020-04-03 DIAGNOSIS — H2513 Age-related nuclear cataract, bilateral: Secondary | ICD-10-CM | POA: Diagnosis not present

## 2020-04-25 ENCOUNTER — Encounter: Payer: Self-pay | Admitting: Podiatry

## 2020-04-25 ENCOUNTER — Ambulatory Visit: Payer: PPO | Admitting: Podiatry

## 2020-04-25 ENCOUNTER — Other Ambulatory Visit: Payer: Self-pay

## 2020-04-25 DIAGNOSIS — B351 Tinea unguium: Secondary | ICD-10-CM | POA: Diagnosis not present

## 2020-04-25 DIAGNOSIS — M79675 Pain in left toe(s): Secondary | ICD-10-CM | POA: Diagnosis not present

## 2020-04-25 DIAGNOSIS — D3A026 Benign carcinoid tumor of the rectum: Secondary | ICD-10-CM | POA: Insufficient documentation

## 2020-04-25 DIAGNOSIS — E049 Nontoxic goiter, unspecified: Secondary | ICD-10-CM | POA: Insufficient documentation

## 2020-04-25 DIAGNOSIS — M79674 Pain in right toe(s): Secondary | ICD-10-CM | POA: Diagnosis not present

## 2020-04-25 DIAGNOSIS — D709 Neutropenia, unspecified: Secondary | ICD-10-CM | POA: Insufficient documentation

## 2020-04-25 DIAGNOSIS — Z9049 Acquired absence of other specified parts of digestive tract: Secondary | ICD-10-CM | POA: Insufficient documentation

## 2020-04-25 DIAGNOSIS — Z9889 Other specified postprocedural states: Secondary | ICD-10-CM | POA: Insufficient documentation

## 2020-05-02 NOTE — Progress Notes (Signed)
Subjective: Bryan Floyd is a 70 y.o. male patient seen today painful mycotic nails b/l that are difficult to trim. Pain interferes with ambulation. Aggravating factors include wearing enclosed shoe gear. Pain is relieved with periodic professional debridement.   He voices no new pedal concerns on today's visit.  Allergies  Allergen Reactions  . Atenolol     Fatigue  . Pravastatin     myalgia   Objective: Physical Exam  General: Bryan Floyd is a pleasant 70 y.o. African American male, in NAD. AAO x 3.   Vascular:  Neurovascular status unchanged b/l. Capillary refill time to digits immediate b/l. Palpable DP pulses b/l. Palpable PT pulses b/l. Pedal hair present b/l. Skin temperature gradient within normal limits b/l. No edema noted b/l.  Dermatological:  Pedal skin with normal turgor, texture and tone bilaterally. No open wounds bilaterally. No interdigital macerations bilaterally. Toenails 1-5 b/l elongated, dystrophic, thickened, crumbly with subungual debris and tenderness to dorsal palpation. Tinea pedis resoled b/l.  Musculoskeletal:  Normal muscle strength 5/5 to all lower extremity muscle groups bilaterally. No gross bony deformities bilaterally. No pain crepitus or joint limitation noted with ROM b/l. Patient ambulates independent of any assistive aids.  Neurological:  Protective sensation intact 5/5 intact bilaterally with 10g monofilament b/l. Vibratory sensation intact b/l. Proprioception intact bilaterally.  Assessment and Plan:  1. Pain due to onychomycosis of toenails of both feet    -Examined patient. -No new findings. No new orders. -Toenails 1-5 b/l were debrided in length and girth with sterile nail nippers and dremel without iatrogenic bleeding. Discussed topical, laser and oral medication. Continue topical antifungal solution from [place]. -Patient to report any pedal injuries to medical professional immediately. -Patient to continue soft,  supportive shoe gear daily. -Patient/POA to call should there be question/concern in the interim.  Return in about 3 months (around 07/26/2020).  Marzetta Board, DPM

## 2020-06-19 DIAGNOSIS — H2513 Age-related nuclear cataract, bilateral: Secondary | ICD-10-CM | POA: Diagnosis not present

## 2020-06-19 DIAGNOSIS — H401123 Primary open-angle glaucoma, left eye, severe stage: Secondary | ICD-10-CM | POA: Diagnosis not present

## 2020-06-19 DIAGNOSIS — H401112 Primary open-angle glaucoma, right eye, moderate stage: Secondary | ICD-10-CM | POA: Diagnosis not present

## 2020-06-20 DIAGNOSIS — I10 Essential (primary) hypertension: Secondary | ICD-10-CM | POA: Diagnosis not present

## 2020-06-20 DIAGNOSIS — E785 Hyperlipidemia, unspecified: Secondary | ICD-10-CM | POA: Diagnosis not present

## 2020-06-20 DIAGNOSIS — E041 Nontoxic single thyroid nodule: Secondary | ICD-10-CM | POA: Diagnosis not present

## 2020-06-21 ENCOUNTER — Other Ambulatory Visit: Payer: Self-pay | Admitting: Family Medicine

## 2020-06-21 DIAGNOSIS — E041 Nontoxic single thyroid nodule: Secondary | ICD-10-CM

## 2020-07-02 ENCOUNTER — Other Ambulatory Visit: Payer: PPO

## 2020-07-05 ENCOUNTER — Other Ambulatory Visit: Payer: Self-pay

## 2020-07-05 ENCOUNTER — Ambulatory Visit
Admission: RE | Admit: 2020-07-05 | Discharge: 2020-07-05 | Disposition: A | Discharge status: Home / Self Care | Payer: PPO | Source: Ambulatory Visit | Attending: Family Medicine | Admitting: Family Medicine

## 2020-07-05 DIAGNOSIS — E041 Nontoxic single thyroid nodule: Secondary | ICD-10-CM | POA: Diagnosis not present

## 2020-08-08 ENCOUNTER — Encounter: Payer: Self-pay | Admitting: Podiatry

## 2020-08-08 ENCOUNTER — Ambulatory Visit: Payer: PPO | Admitting: Podiatry

## 2020-08-08 ENCOUNTER — Other Ambulatory Visit: Payer: Self-pay

## 2020-08-08 DIAGNOSIS — B351 Tinea unguium: Secondary | ICD-10-CM

## 2020-08-08 DIAGNOSIS — M79674 Pain in right toe(s): Secondary | ICD-10-CM

## 2020-08-08 DIAGNOSIS — M79675 Pain in left toe(s): Secondary | ICD-10-CM

## 2020-08-14 NOTE — Progress Notes (Signed)
Subjective: Bryan Floyd is a pleasant 70 y.o. male patient seen today painful thick toenails that are difficult to trim. Pain interferes with ambulation. Aggravating factors include wearing enclosed shoe gear. Pain is relieved with periodic professional debridement.  He voices no new pedal concerns on today's visit.  PCP is Harlan Stains, MD. Last visit was: 06/20/2020.  Allergies  Allergen Reactions   Atenolol     Fatigue Other reaction(s): fatigue   Pravastatin     myalgia   Pravastatin Sodium     Other reaction(s): myalgia   Objective: Physical Exam  General: Freman Lapage is a pleasant 70 y.o. African American male, morbidly obese in NAD. AAO x 3.   Vascular:  Capillary refill time to digits immediate b/l. Palpable pedal pulses b/l LE. Pedal hair present. Lower extremity skin temperature gradient within normal limits. No pain with calf compression b/l. No edema noted b/l lower extremities.  Dermatological:  Toenails 1-5 b/l elongated, discolored, dystrophic, thickened, crumbly with subungual debris and tenderness to dorsal palpation.  Musculoskeletal:  Normal muscle strength 5/5 to all lower extremity muscle groups bilaterally. No pain crepitus or joint limitation noted with ROM b/l. No gross bony deformities bilaterally.  Neurological:  Protective sensation intact 5/5 intact bilaterally with 10g monofilament b/l. Vibratory sensation intact b/l.  Assessment and Plan:  1. Pain due to onychomycosis of toenails of both feet     -Examined patient. -Patient to continue soft, supportive shoe gear daily. -Toenails 1-5 b/l were debrided in length and girth with sterile nail nippers and dremel without iatrogenic bleeding.  -Patient to report any pedal injuries to medical professional immediately. -Patient/POA to call should there be question/concern in the interim.  Return in about 4 months (around 12/08/2020).  Marzetta Board, DPM

## 2020-09-19 DIAGNOSIS — H401123 Primary open-angle glaucoma, left eye, severe stage: Secondary | ICD-10-CM | POA: Diagnosis not present

## 2020-09-19 DIAGNOSIS — H2513 Age-related nuclear cataract, bilateral: Secondary | ICD-10-CM | POA: Diagnosis not present

## 2020-09-19 DIAGNOSIS — H401132 Primary open-angle glaucoma, bilateral, moderate stage: Secondary | ICD-10-CM | POA: Diagnosis not present

## 2020-10-16 ENCOUNTER — Encounter (HOSPITAL_BASED_OUTPATIENT_CLINIC_OR_DEPARTMENT_OTHER): Payer: Self-pay | Admitting: Cardiovascular Disease

## 2020-10-16 ENCOUNTER — Other Ambulatory Visit: Payer: Self-pay

## 2020-10-16 ENCOUNTER — Ambulatory Visit (HOSPITAL_BASED_OUTPATIENT_CLINIC_OR_DEPARTMENT_OTHER): Payer: PPO | Admitting: Cardiovascular Disease

## 2020-10-16 DIAGNOSIS — E785 Hyperlipidemia, unspecified: Secondary | ICD-10-CM

## 2020-10-16 DIAGNOSIS — I10 Essential (primary) hypertension: Secondary | ICD-10-CM | POA: Diagnosis not present

## 2020-10-16 MED ORDER — HYDRALAZINE HCL 100 MG PO TABS: 100.0000 mg | ORAL_TABLET | Freq: Three times a day (TID) | ORAL | 3 refills | Status: DC

## 2020-10-16 NOTE — Assessment & Plan Note (Addendum)
Blood pressure is better controlled since increasing hydralazine but still not at goal.  We will increase it to 100 mg 3 times daily.  He will continue the amlodipine, olmesartan, amlodipine and HCTZ.  He is interested in the PREP program at the [facility].  He is not interested in remote patient monitoring.  We will get renal artery Dopplers.  Check renin/aldosterone and hold olmesartan two days prior.  He is going to work on increasing his exercise to 150 minutes/week and limiting sodium intake.

## 2020-10-16 NOTE — Assessment & Plan Note (Signed)
Continue rosuvastatin.  

## 2020-10-16 NOTE — Progress Notes (Signed)
Advanced [facility]pertension [facility] Initial Assessment:    Date:  10/18/2020   ID:  Bryan Floyd, DOB 1950-06-19, MRN [id number]  PCP:  Harlan Stains, MD  Cardiologist:  None  Nephrologist:  Referring MD: Harlan Stains, MD   CC: Hypertension  History of Present Illness:    Bryan Floyd is a 70 y.o. male with a hx of colon cancer s/p partial colectomy, hypertension, hyperlipidemia, and morbid obesity here to establish care in the [facility]. He last saw his PCP 05/2020. At that time he was on amlodipine, metoprolol, and HCTZ, and his blood pressure was 164/95. He reported struggling with his diet. Hydralazine was increased to 50 mg and olmesartan was added. He last saw [facility] in 2019 for pre-operative clearance. He had a stress Echo 2019 with normal systolic function and no ischemia.  Today, he is feeling better overall. He was first dx with hypertension 20 years ago, and has always struggled to keep it controlled. The recent increase of hydralazine to 50 mg seems to be working for him. At home he typically notices blood pressures around 140/82. For exercise, he walks 20-30 minutes about 2-3 times a week. He tries to walk a path with several inclines. Usually he feels well while exercising. His diet typically consists of pork. He does not add salt to his meals aside from food preparation. A few months ago he eliminated coffee, and is now drinking green tea. When he was drinking coffee he would feel palpitations more often. Previously he was drinking 18 beers a month, but has now cut back to 6 beers a month. For LE pain management he takes naproxen once or twice a month. At nighttime or after sitting for long periods his hands will become numb due to carpal tunnel. He used to work as a Administrator. He believes he does snore. He denies any chest pain, or shortness of breath. No lightheadedness, headaches, syncope, orthopnea, or PND. Also has no lower extremity edema or  exertional symptoms.  Previous antihypertensives: None  Past Medical History:  Diagnosis Date   Colon cancer [facility])    Colon polyps    Family history of prostate cancer    Genetic testing 07/31/2017   Multi-Cancer panel (83 genes) @ [facility] - No pathogenic mutations detected   Glaucoma    Hypertension     Past Surgical History:  Procedure Laterality Date   COLONOSCOPY WITH PROPOFOL N/A 03/25/2016   Procedure: COLONOSCOPY WITH PROPOFOL;  Surgeon: Clarene Essex, MD;  Location: [facility] ENDOSCOPY;  Service: Endoscopy;  Laterality: N/A;   COLONOSCOPY WITH PROPOFOL N/A 04/10/2017   Procedure: COLONOSCOPY WITH PROPOFOL;  Surgeon: Ileana Roup, MD;  Location: [facility];  Service: General;  Laterality: N/A;   CYSTOSCOPY N/A 07/24/2013   Procedure: CYSTOSCOPY;  Surgeon: Festus Aloe, MD;  Location: [facility] OR[facility];  Service: Urology;  Laterality: N/A;   EYE SURGERY     laser   HOT HEMOSTASIS N/A 03/25/2016   Procedure: HOT HEMOSTASIS (ARGON PLASMA COAGULATION/BICAP);  Surgeon: Clarene Essex, MD;  Location: [facility];  Service: Endoscopy;  Laterality: N/A;   LAPAROSCOPIC RIGHT HEMI COLECTOMY Right 06/05/2017   Procedure: LAPAROSCOPIC ASSISTED SUBTOTAL COLECTOMY WITH ILEO DESCENDING ANASTOMOSIS AND TAKEDOWN OF SPLENIC FLEXURE;  Surgeon: Ileana Roup, MD;  Location: [facility];  Service: General;  Laterality: Right;   TRANSURETHRAL RESECTION OF PROSTATE N/A 07/24/2013   Procedure: TRANSURETHRAL RESECTION OF THE PROSTATE (TURP), staged;  Surgeon: Festus Aloe, MD;  Location: [facility];  Service: Urology;  Laterality:  N/A;    Current Medications: Current Meds  Medication Sig   amLODipine (NORVASC) 10 MG tablet Take 10 mg by mouth daily.   brimonidine-timolol (COMBIGAN) 0.2-0.5 % ophthalmic solution Place 1 drop into both eyes every 12 (twelve) hours.   cetirizine (ZYRTEC) 10 MG tablet Take 10 mg by mouth daily as needed for allergies.    cyclobenzaprine (FLEXERIL) 10 MG tablet Take 10 mg by  mouth 3 (three) times daily as needed.   Difluprednate 0.05 % EMUL INSTILL 1 DROP IN AFFECTED EYE(S) FOUR TIMES DAILY   finasteride (PROSCAR) 5 MG tablet Take 1 tablet (5 mg total) by mouth daily.   metoprolol succinate (TOPROL-XL) 25 MG 24 hr tablet Take 12.5 mg by mouth daily.   Multiple Vitamin (MULTIVITAMIN WITH MINERALS) TABS tablet Take 1 tablet by mouth daily. Takes Centrum daily   naproxen (NAPROSYN) 500 MG tablet Take 500 mg by mouth daily as needed for moderate pain.    Netarsudil-Latanoprost (ROCKLATAN) 0.02-0.005 % SOLN 1 drop into affected eye   NONFORMULARY OR COMPOUNDED ITEM Antifungal solution: Terbinafine 3%, Fluconazole 2%, Tea Tree Oil 5%, Urea 10%, Ibuprofen 2% in DMSO suspension #73m   olmesartan-hydrochlorothiazide (BENICAR HCT) 40-25 MG tablet 1 tablet   RESTASIS 0.05 % ophthalmic emulsion    rosuvastatin (CRESTOR) 10 MG tablet Take 1 tablet by mouth every evening.   tamsulosin (FLOMAX) 0.4 MG CAPS capsule Take 1 capsule (0.4 mg total) by mouth daily after supper.   Vitamins/Minerals TABS Take by mouth.   [DISCONTINUED] hydrALAZINE (APRESOLINE) 50 MG tablet Take 50 mg by mouth 3 (three) times daily.     Allergies:   Atenolol, Pravastatin, and Pravastatin sodium   Social History   Socioeconomic History   Marital status: Married    Spouse name: Not on file   Number of children: Not on file   Years of education: Not on file   Highest education level: Not on file  Occupational History   Occupation: retired bus driver  Tobacco Use   Smoking status: Never   Smokeless tobacco: Never  Vaping Use   Vaping Use: Never used  Substance and Sexual Activity   Alcohol use: Yes    Alcohol/week: 1.0 standard drink    Types: 1 Cans of beer per week    Comment: 6 pack / week   Drug use: No   Sexual activity: Not on file  Other Topics Concern   Not on file  Social History Narrative   Not on file   Social Determinants of Health   Financial Resource Strain: Low Risk     Difficulty of Paying Living Expenses: Not hard at all  Food Insecurity: No Food Insecurity   Worried About Bryan Floyd: Never true   Bryan Floyd: Never true  Transportation Needs: No Transportation Needs   Lack of Transportation (Medical): No   Lack of Transportation (Non-Medical): No  Physical Activity: Insufficiently Active   Days of Exercise per Week: 4 days   Minutes of Exercise per Session: 30 min  Stress: No Stress Concern Present   Feeling of Stress : Not at all  Social Connections: Not on file     Family History: The patient's family history includes Heart disease in his mother; Lung cancer in his paternal aunt; Other in his mother; Prostate cancer in his father.  ROS:   Please see the history of present illness.    (+) Bilateral LE pain (+) Numbness  in bilateral hands All other systems reviewed and are negative.  EKGs/Labs/Other Studies Reviewed:    Stress Echo March 31, 2017: Study Conclusions  - Stress ECG conclusions: There were no stress arrhythmias or    conduction abnormalities. The stress ECG was negative for    ischemia.  - Staged echo: There was no echocardiographic evidence for    stress-induced ischemia.   Impressions:  - Fair exercise tolerance.    No chest pain.    No changes on ECg.    Normal echocardiographic assessment.    Overall : Negative stress echo for exercise induced ischemia.   EKG:   10/16/2020: Sinus Rhythm. Rate 74 bpm. LAFB. PVC.  Recent Labs: No results found for requested labs within last 8760 hours.   Recent Lipid Panel    Component Value Date/Time   TRIG 136 06/15/2017 0507    Physical Exam:   VS:  BP 140/82 (BP Location: Right Arm, Patient Position: Sitting)   Pulse 74   Ht '5\' 10"'$  (1.778 m)   Wt 245 lb 12.8 oz (111.5 kg)   BMI 35.27 kg/m  , BMI Body mass index is 35.27 kg/m. GENERAL:  Well appearing HEENT: Pupils equal round and reactive, fundi not visualized, oral mucosa  unremarkable NECK:  No jugular venous distention, waveform within normal limits, carotid upstroke brisk and symmetric, no bruits,  +thyromegaly LYMPHATICS:  No cervical adenopathy LUNGS:  Clear to auscultation bilaterally HEART:  Mostly regular with occasional ectopy PMI not displaced or sustained,S1 and S2 within normal limits, no S3, no S4, no clicks, no rubs, no murmurs ABD:  Flat, positive bowel sounds normal in frequency in pitch, no bruits, no rebound, no guarding, no midline pulsatile mass, no hepatomegaly, no splenomegaly EXT:  2 plus pulses throughout, no edema, no cyanosis no clubbing SKIN:  No rashes no nodules NEURO:  Cranial nerves II through XII grossly intact, motor grossly intact throughout PSYCH:  Cognitively intact, oriented to person place and time   ASSESSMENT/PLAN:    Essential hypertension Blood pressure is better controlled since increasing hydralazine but still not at goal.  We will increase it to 100 mg 3 times daily.  He will continue the amlodipine, olmesartan, amlodipine and HCTZ.  He is interested in the PREP program at the [facility].  He is not interested in remote patient monitoring.  We will get renal artery Dopplers.  Check renin/aldosterone and hold olmesartan two days prior.  He is going to work on increasing his exercise to 150 minutes/week and limiting sodium intake.  Hyperlipidemia Continue rosuvastatin.   Screening for Secondary Hypertension:  Causes 10/16/2020  Drugs/Herbals Screened     - Comments rare NSAIDs  Renovascular HTN Screened     - Comments check renal artery Dopplers  Sleep Apnea Screened     - Comments snoring with no other symptoms  Thyroid Disease Screened  Hyperaldosteronism Screened     - Comments check renin/aldosterone  Pheochromocytoma N/A  Cushing's Syndrome N/A  Hyperparathyroidism N/A  Coarctation of the Aorta Screened     - Comments BP symmetric  Compliance Screened    Relevant Labs/Studies: Basic Labs Latest Ref Rng &  Units 06/18/2017 06/17/2017 06/16/2017  Sodium 135 - 145 mmol/L 132(L) 133(L) 137  Potassium 3.5 - 5.1 mmol/L 4.1 3.8 3.5  Creatinine 0.61 - 1.24 mg/dL 0.77 0.95 0.93                Renovascular  10/16/2020  Renal Artery [place] Completed Yes      Disposition:  FU with PharmD in 1 month  FU with Bryan Lawn[place]ak C. Oval Linsey, MD, [facility] in 4 months.   Medication Adjustments/Labs and Tests Ordered: Current medicines are reviewed at length with the patient today.  Concerns regarding medicines are outlined above.   Orders Placed This Encounter  Procedures   Aldosterone + renin activity w/ ratio   EKG 12-Lead   VAS US RENAL ARTERY DUPLEX    Meds ordered this encounter  Medications   hydrALAZINE (APRESOLINE) 100 MG tablet    Sig: Take 1 tablet (100 mg total) by mouth 3 (three) times daily.    Dispense:  270 tablet    Refill:  3    NEW DOSE, D/C 50 MG RX    I,Mathew Stumpf,acting as a scribe for Skeet Latch, MD.,have documented all relevant documentation on the behalf of Skeet Latch, MD,as directed by  Skeet Latch, MD while in the presence of Skeet Latch, MD.  I, Bryan Oval Linsey, MD have reviewed all documentation for this visit.  The documentation of the exam, diagnosis, procedures, and orders on 10/18/2020 are all accurate and complete.   Signed, Skeet Latch, MD  10/18/2020 4:30 PM    [place]

## 2020-10-16 NOTE — Patient Instructions (Addendum)
Medication Instructions:  INCREASE YOUR HYDRALAZINE TO 100 MG THREE TIMES A DAY    Labwork: HOLD YOUR OLMESARTAN 2 DAYS PRIOR TO GETTING ALDOSTERONE/RENIN LEVEL    Testing/Procedures: Your physician has requested that you have a renal artery duplex. During this test, an ultrasound is used to evaluate blood flow to the kidneys. Allow one hour for this exam. Do not eat after midnight the day before and avoid carbonated beverages. Take your medications as you usually do. [facility] [place] [address]    Follow-Up: 11/19/2020 2:00 PM WITH PHARM D AT [facility]   Special Instructions:   MONITOR YOUR BLOOD PRESSURE TWICE A DAY, LOG IN THE BOOK PROVIDED. BRING THE BOOK AND YOUR BLOOD PRESSURE MACHINE TO YOUR FOLLOW UP IN 1 MONTH   DASH Eating Plan DASH stands for "Dietary Approaches to Stop Hypertension." The DASH eating plan is a healthy eating plan that has been shown to reduce high blood pressure (hypertension). It may also reduce your risk for type 2 diabetes, heart disease, and stroke. The DASH eating plan may also help with weight loss. What are tips for following this plan?  General guidelines Avoid eating more than 2,300 mg (milligrams) of salt (sodium) a day. If you have hypertension, you may need to reduce your sodium intake to 1,500 mg a day. Limit alcohol intake to no more than 1 drink a day for nonpregnant women and 2 drinks a day for men. One drink equals 12 oz of beer, 5 oz of wine, or 1 oz of hard liquor. Work with your health care provider to maintain a healthy body weight or to lose weight. Ask what an ideal weight is for you. Get at least 30 minutes of exercise that causes your heart to beat faster (aerobic exercise) most days of the week. Activities may include walking, swimming, or biking. Work with your health care provider or diet and nutrition specialist (dietitian) to adjust your eating plan to your individual calorie needs. Reading food  labels  Check food labels for the amount of sodium per serving. Choose foods with less than 5 percent of the Daily Value of sodium. Generally, foods with less than 300 mg of sodium per serving fit into this eating plan. To find whole grains, look for the word "whole" as the first word in the ingredient list. Shopping Buy products labeled as "low-sodium" or "no salt added." Buy fresh foods. Avoid canned foods and premade or frozen meals. Cooking Avoid adding salt when cooking. Use salt-free seasonings or herbs instead of table salt or sea salt. Check with your health care provider or pharmacist before using salt substitutes. Do not fry foods. Cook foods using healthy methods such as baking, boiling, grilling, and broiling instead. Cook with heart-healthy oils, such as olive, canola, soybean, or sunflower oil. Meal planning Eat a balanced diet that includes: 5 or more servings of fruits and vegetables each day. At each meal, try to fill half of your plate with fruits and vegetables. Up to 6-8 servings of whole grains each day. Less than 6 oz of lean meat, poultry, or fish each day. A 3-oz serving of meat is about the same size as a deck of cards. One egg equals 1 oz. 2 servings of low-fat dairy each day. A serving of nuts, seeds, or beans 5 times each week. Heart-healthy fats. Healthy fats called Omega-3 fatty acids are found in foods such as flaxseeds and coldwater fish, like sardines, salmon, and mackerel. Limit how  much you eat of the following: Canned or prepackaged foods. Food that is high in trans fat, such as fried foods. Food that is high in saturated fat, such as fatty meat. Sweets, desserts, sugary drinks, and other foods with added sugar. Full-fat dairy products. Do not salt foods before eating. Try to eat at least 2 vegetarian meals each week. Eat more home-cooked food and less restaurant, buffet, and fast food. When eating at a restaurant, ask that your food be prepared with  less salt or no salt, if possible. What foods are recommended? The items listed may not be a complete list. Talk with your dietitian about what dietary choices are best for you. Grains Whole-grain or whole-wheat bread. Whole-grain or whole-wheat pasta. Brown rice. [place]. Bulgur. Whole-grain and low-sodium cereals. Pita bread. Low-fat, low-sodium crackers. Whole-wheat flour tortillas. Vegetables Fresh or frozen vegetables (raw, steamed, roasted, or grilled). Low-sodium or reduced-sodium tomato and vegetable juice. Low-sodium or reduced-sodium tomato sauce and tomato paste. Low-sodium or reduced-sodium canned vegetables. Fruits All fresh, dried, or frozen fruit. Canned fruit in natural juice (without added sugar). Meat and other protein foods Skinless chicken or [place]. Ground chicken or [place]. Pork with fat trimmed off. Fish and seafood. Egg whites. Dried beans, peas, or lentils. Unsalted nuts, nut butters, and seeds. Unsalted canned beans. Lean cuts of beef with fat trimmed off. Low-sodium, lean deli meat. Dairy Low-fat (1%) or fat-free (skim) milk. Fat-free, low-fat, or reduced-fat cheeses. Nonfat, low-sodium ricotta or cottage cheese. Low-fat or nonfat yogurt. Low-fat, low-sodium cheese. Fats and oils Soft margarine without trans fats. Vegetable oil. Low-fat, reduced-fat, or light mayonnaise and salad dressings (reduced-sodium). Canola, safflower, olive, soybean, and sunflower oils. Avocado. Seasoning and other foods Herbs. Spices. Seasoning mixes without salt. Unsalted popcorn and pretzels. Fat-free sweets. What foods are not recommended? The items listed may not be a complete list. Talk with your dietitian about what dietary choices are best for you. Grains Baked goods made with fat, such as croissants, muffins, or some breads. Dry pasta or rice meal packs. Vegetables Creamed or fried vegetables. Vegetables in a cheese sauce. Regular canned vegetables (not low-sodium or  reduced-sodium). Regular canned tomato sauce and paste (not low-sodium or reduced-sodium). Regular tomato and vegetable juice (not low-sodium or reduced-sodium). Angie Fava. Olives. Fruits Canned fruit in a light or heavy syrup. Fried fruit. Fruit in cream or butter sauce. Meat and other protein foods Fatty cuts of meat. Ribs. Fried meat. Berniece Salines. Sausage. Bologna and other processed lunch meats. Salami. Fatback. Hotdogs. Bratwurst. Salted nuts and seeds. Canned beans with added salt. Canned or smoked fish. Whole eggs or egg yolks. Chicken or [place] with skin. Dairy Whole or 2% milk, cream, and half-and-half. Whole or full-fat cream cheese. Whole-fat or sweetened yogurt. Full-fat cheese. Nondairy creamers. Whipped toppings. Processed cheese and cheese spreads. Fats and oils Butter. Stick margarine. Lard. Shortening. Ghee. Bacon fat. Tropical oils, such as coconut, palm kernel, or palm oil. Seasoning and other foods Salted popcorn and pretzels. Onion salt, garlic salt, seasoned salt, table salt, and sea salt. Worcestershire sauce. Tartar sauce. Barbecue sauce. Teriyaki sauce. Soy sauce, including reduced-sodium. Steak sauce. Canned and packaged gravies. Fish sauce. Oyster sauce. Cocktail sauce. Horseradish that you find on the shelf. Ketchup. Mustard. Meat flavorings and tenderizers. Bouillon cubes. Hot sauce and Tabasco sauce. Premade or packaged marinades. Premade or packaged taco seasonings. Relishes. Regular salad dressings. Where to find more information: National Heart, Lung, and [place]: https://wilson-eaton.com/ American Heart Association: www.heart.org Summary The DASH eating plan is  a healthy eating plan that has been shown to reduce high blood pressure (hypertension). It may also reduce your risk for type 2 diabetes, heart disease, and stroke. With the DASH eating plan, you should limit salt (sodium) intake to 2,300 mg a day. If you have hypertension, you may need to reduce your sodium intake to  1,500 mg a day. When on the DASH eating plan, aim to eat more fresh fruits and vegetables, whole grains, lean proteins, low-fat dairy, and heart-healthy fats. Work with your health care provider or diet and nutrition specialist (dietitian) to adjust your eating plan to your individual calorie needs. This information is not intended to replace advice given to you by your health care provider. Make sure you discuss any questions you have with your health care provider. Document Released: 01/30/2011 Document Revised: 01/23/2017 Document Reviewed: 02/04/2016 Elsevier Patient Education  2020 [employer].

## 2020-10-18 ENCOUNTER — Encounter (HOSPITAL_BASED_OUTPATIENT_CLINIC_OR_DEPARTMENT_OTHER): Payer: Self-pay | Admitting: Cardiovascular Disease

## 2020-10-19 ENCOUNTER — Ambulatory Visit (HOSPITAL_COMMUNITY)
Admission: RE | Admit: 2020-10-19 | Discharge: 2020-10-19 | Disposition: A | Discharge status: Home / Self Care | Payer: PPO | Source: Ambulatory Visit | Attending: Cardiology | Admitting: Cardiology

## 2020-10-19 ENCOUNTER — Encounter (HOSPITAL_BASED_OUTPATIENT_CLINIC_OR_DEPARTMENT_OTHER): Payer: Self-pay

## 2020-10-19 ENCOUNTER — Other Ambulatory Visit: Payer: Self-pay

## 2020-10-19 DIAGNOSIS — I10 Essential (primary) hypertension: Secondary | ICD-10-CM | POA: Diagnosis not present

## 2020-10-25 ENCOUNTER — Telehealth (HOSPITAL_BASED_OUTPATIENT_CLINIC_OR_DEPARTMENT_OTHER): Payer: Self-pay | Admitting: Cardiovascular Disease

## 2020-10-25 NOTE — Telephone Encounter (Signed)
Pt c/o medication issue:  1. Name of Medication: hydrALAZINE (APRESOLINE) 100 MG tablet  2. How are you currently taking this medication (dosage and times per day)? 1 tablet 3 times daily   3. Are you having a reaction (difficulty breathing--STAT)? no  4. What is your medication issue? Stated that he think his bp is too low because of this medication it stated it was. Wanted to know if he needs to reduce the amount he is taking

## 2020-10-25 NOTE — Telephone Encounter (Signed)
Pt calling to report episodes of hypotension since he began hydralazine. He states he awoke today with a BP in the 110's range. When he took his AM dose, his BP dropped to 85/50. He was light headed and dizzy. Currently he is back up to SBP 116.  I advised him to monitor his BP prior to taking his hydralazine. He can hold it if his SBP < 130 and should take it if it is over 130.  I will forward to Dr. Oval Linsey and her RN for further review, recommendation and follow up.

## 2020-10-26 ENCOUNTER — Other Ambulatory Visit: Payer: Self-pay | Admitting: Urology

## 2020-10-26 DIAGNOSIS — Q6102 Congenital multiple renal cysts: Secondary | ICD-10-CM

## 2020-10-26 LAB — ALDOSTERONE + RENIN ACTIVITY W/ RATIO
ALDOS/RENIN RATIO: 4.7 (ref 0.0–30.0)
ALDOSTERONE: 7 ng/dL (ref 0.0–30.0)
Renin: 1.486 ng/mL/hr (ref 0.167–5.380)

## 2020-10-30 NOTE — Telephone Encounter (Signed)
October 30, 2020 Skeet Latch, MD to Dollene Primrose, RN  Me    1:00 PM Recommend reducing to '75mg'$  instead of '100mg'$ .   TCR   When called patient to review changes he stated blood pressure was doing better and felt better When asked he stated no  SBP below 100  Advised to continue same dose of medications, no changes at this time

## 2020-11-01 ENCOUNTER — Ambulatory Visit
Admission: RE | Admit: 2020-11-01 | Discharge: 2020-11-01 | Disposition: A | Discharge status: Home / Self Care | Payer: PPO | Source: Ambulatory Visit | Attending: Urology | Admitting: Urology

## 2020-11-01 ENCOUNTER — Other Ambulatory Visit: Payer: Self-pay

## 2020-11-01 DIAGNOSIS — N281 Cyst of kidney, acquired: Secondary | ICD-10-CM | POA: Diagnosis not present

## 2020-11-01 DIAGNOSIS — Q6102 Congenital multiple renal cysts: Secondary | ICD-10-CM

## 2020-11-01 DIAGNOSIS — N400 Enlarged prostate without lower urinary tract symptoms: Secondary | ICD-10-CM | POA: Diagnosis not present

## 2020-11-01 NOTE — Telephone Encounter (Signed)
Discussed with Dr Oval Linsey, she is agreeable with plan

## 2020-11-14 DIAGNOSIS — H2513 Age-related nuclear cataract, bilateral: Secondary | ICD-10-CM | POA: Diagnosis not present

## 2020-11-14 DIAGNOSIS — H401132 Primary open-angle glaucoma, bilateral, moderate stage: Secondary | ICD-10-CM | POA: Diagnosis not present

## 2020-11-14 DIAGNOSIS — H401123 Primary open-angle glaucoma, left eye, severe stage: Secondary | ICD-10-CM | POA: Diagnosis not present

## 2020-11-19 ENCOUNTER — Other Ambulatory Visit: Payer: Self-pay

## 2020-11-19 ENCOUNTER — Ambulatory Visit: Payer: PPO | Admitting: Pharmacist Clinician (PhC)/ Clinical Pharmacy Specialist

## 2020-11-19 DIAGNOSIS — I10 Essential (primary) hypertension: Secondary | ICD-10-CM | POA: Diagnosis not present

## 2020-11-19 NOTE — Patient Instructions (Signed)
  Check your blood pressure at home daily and keep record of the readings.  If you notice home readings remain elevated > 140/80 consistently, please give me a call at [phone] (Alonnah Lampkins/Chris)  Take your BP meds as follows:  Move amlodipine and metoprolol to evenings, starting tomorrow  Continue with all other medications    Bring all of your meds, your BP cuff and your record of home blood pressures to your next appointment.  Exercise as you're able, try to walk approximately 30 minutes per day.  Keep salt intake to a minimum, especially watch canned and prepared boxed foods.  Eat more fresh fruits and vegetables and fewer canned items.  Avoid eating in fast food restaurants.    HOW TO TAKE YOUR BLOOD PRESSURE: Rest 5 minutes before taking your blood pressure.  Don't smoke or drink caffeinated beverages for at least 30 minutes before. Take your blood pressure before (not after) you eat. Sit comfortably with your back supported and both feet on the floor (don't cross your legs). Elevate your arm to heart level on a table or a desk. Use the proper sized cuff. It should fit smoothly and snugly around your bare upper arm. There should be enough room to slip a fingertip under the cuff. The bottom edge of the cuff should be 1 inch above the crease of the elbow. Ideally, take 3 measurements at one sitting and record the average.

## 2020-11-19 NOTE — Progress Notes (Signed)
11/19/2020 Bryan Floyd Apr 27, 1950 [id number]   HPI:  Bryan Floyd is a 70 y.o. male patient of Dr Oval Linsey, who presents today for advanced hypertension clinic follow up.  In addition to hypertension, his cardiac history is significant for hyperlipidemia.  Patient reported he was first diagnosed with hypertension about 20 years ago and has struggled with it since.  When he saw Dr. Oval Linsey last month hydralazine was increased from 50 to 100 mg three times daily.    Today he returns for follow up.  Has no complaints or side effects from his medications.  Did not bring any home readings with him, but notes home readings vary (see below).    Recent tests showed renin/aldosterone labs and renal dopplers were all negative.    Blood Pressure Goal:  130/80  Current Medications: olmesartan hctz 40/25 mg qd, hydralazine 100 mg tid, metoprolol succ 12.5 mg qd, amlodipine 10 mg qd  Family Hx: was raised by paternal aunt and uncle; did not know mother; father died at 65 - patient doesn't know cause;  1/2 sister (paternal) w/o cardiac issues; 2 sons healthy;   Social Hx: no tobacco, no alcohol, gave up coffee because of elevated pulse  Diet: mostly home cooked, cut out salf from cooking; plenty of meats. Fresh or frozen vegetables; no snacking  Exercise: walks most days of the week, 30 min/day, yard care and home maintenance.  (Missed about 2 weeks when had flu recently)  Home BP readings: will check 3 readings in 10-15 minutes, can range from 170-130 between first and third readings, however most readings are 130-140's.  Believes most diastolic are mid 42'A to low 80's  Intolerances: atenolol caused fatigue  Labs: 7/21:  Na 142, K 4.2, BUN 14, SCR 0.93, GFR 80   Wt Readings from Last 3 Encounters:  11/19/20 242 lb 3.2 oz (109.9 kg)  10/16/20 245 lb 12.8 oz (111.5 kg)  06/19/17 256 lb 13.4 oz (116.5 kg)   BP Readings from Last 3 Encounters:  11/19/20 140/78  10/16/20 140/82   03/24/18 (!) 165/92   Pulse Readings from Last 3 Encounters:  11/19/20 74  10/16/20 74  03/24/18 86    Current Outpatient Medications  Medication Sig Dispense Refill   amLODipine (NORVASC) 10 MG tablet Take 10 mg by mouth daily.     brimonidine-timolol (COMBIGAN) 0.2-0.5 % ophthalmic solution Place 1 drop into both eyes every 12 (twelve) hours.     cetirizine (ZYRTEC) 10 MG tablet Take 10 mg by mouth daily as needed for allergies.      cyclobenzaprine (FLEXERIL) 10 MG tablet Take 10 mg by mouth 3 (three) times daily as needed.     finasteride (PROSCAR) 5 MG tablet Take 1 tablet (5 mg total) by mouth daily. 30 tablet 0   hydrALAZINE (APRESOLINE) 100 MG tablet Take 1 tablet (100 mg total) by mouth 3 (three) times daily. 270 tablet 3   metoprolol succinate (TOPROL-XL) 25 MG 24 hr tablet Take 12.5 mg by mouth daily.     Multiple Vitamin (MULTIVITAMIN WITH MINERALS) TABS tablet Take 1 tablet by mouth daily. Takes Centrum daily     naproxen (NAPROSYN) 500 MG tablet Take 500 mg by mouth daily as needed for moderate pain.      Netarsudil-Latanoprost (ROCKLATAN) 0.02-0.005 % SOLN Apply to eye.     NONFORMULARY OR COMPOUNDED ITEM Antifungal solution: Terbinafine 3%, Fluconazole 2%, Tea Tree Oil 5%, Urea 10%, Ibuprofen 2% in DMSO suspension #51mL 1 each 3  olmesartan-hydrochlorothiazide (BENICAR HCT) 40-25 MG tablet 1 tablet     Propylene Glycol (SYSTANE COMPLETE OP) Apply to eye.     RESTASIS 0.05 % ophthalmic emulsion      rosuvastatin (CRESTOR) 10 MG tablet Take 1 tablet by mouth every evening.     tamsulosin (FLOMAX) 0.4 MG CAPS capsule Take 1 capsule (0.4 mg total) by mouth daily after supper. 30 capsule 0   Difluprednate 0.05 % EMUL INSTILL 1 DROP IN AFFECTED EYE(S) FOUR TIMES DAILY (Patient not taking: Reported on 11/19/2020)     FLUAD QUADRIVALENT 0.5 ML injection  (Patient not taking: Reported on 11/19/2020)     Vitamins/Minerals TABS Take by mouth. (Patient not taking: Reported on  11/19/2020)     No current facility-administered medications for this visit.    Allergies  Allergen Reactions   Atenolol     Fatigue Other reaction(s): fatigue   Pravastatin     myalgia   Pravastatin Sodium     Other reaction(s): myalgia    Past Medical History:  Diagnosis Date   Colon cancer ([place])    Colon polyps    Family history of prostate cancer    Genetic testing 07/31/2017   Multi-Cancer panel (83 genes) @ [facility] - No pathogenic mutations detected   Glaucoma    Hypertension    Screening for Secondary Hypertension:   Causes 10/16/2020 11/19/2020  Drugs/Herbals Screened Screened     - Comments rare NSAIDs -  Renovascular HTN Screened Screened     - Comments check renal artery Dopplers renal dopplers negative  Sleep Apnea Screened -     - Comments snoring with no other symptoms -  Thyroid Disease Screened -  Hyperaldosteronism Screened Screened     - Comments check renin/aldosterone WNL  Pheochromocytoma N/A -  Cushing's Syndrome N/A -  Hyperparathyroidism N/A -  Coarctation of the Aorta Screened -     - Comments BP symmetric -  Compliance Screened Screened    Relevant Labs/Studies: Basic Labs Latest Ref Rng & Units 06/18/2017 06/17/2017 06/16/2017  Sodium 135 - 145 mmol/L 132(L) 133(L) 137  Potassium 3.5 - 5.1 mmol/L 4.1 3.8 3.5  Creatinine 0.61 - 1.24 mg/dL 0.77 0.95 0.93       Renin/Aldosterone  Latest Ref Rng & Units 10/19/2020  Aldosterone 0.0 - 30.0 ng/dL 7.0  Renin 0.167 - 5.380 ng/mL/hr 1.486  Aldos/Renin Ratio 0.0 - 30.0 4.7          Renovascular  10/19/2020  Renal Artery [place] Completed Yes      Blood pressure 140/78, pulse 74, resp. rate 15, height 5\' 10"  (1.778 m), weight 242 lb 3.2 oz (109.9 kg), SpO2 99 %.  Essential hypertension Patient with essential hypertension, close to BP goals.  Patient working on weight loss and maintaining exercise levels.  Reviewed BP technique, including to wait 5 minutes after placing cuff for first reading.  He  will continue to monitor and record home readings.  Will divide medications and move amlodipine and metoprolol to evenings.  Continue valsartan hctz in the mornings and hydralazine three times daily.  He should contact office if home BP readings trend > 140/80.     Tommy Medal PharmD CPP [facility] [address] [place], [place] [address] [phone]

## 2020-11-19 NOTE — Assessment & Plan Note (Signed)
Patient with essential hypertension, close to BP goals.  Patient working on weight loss and maintaining exercise levels.  Reviewed BP technique, including to wait 5 minutes after placing cuff for first reading.  He will continue to monitor and record home readings.  Will divide medications and move amlodipine and metoprolol to evenings.  Continue valsartan hctz in the mornings and hydralazine three times daily.  He should contact office if home BP readings trend > 140/80.

## 2020-11-29 DIAGNOSIS — H401132 Primary open-angle glaucoma, bilateral, moderate stage: Secondary | ICD-10-CM | POA: Diagnosis not present

## 2020-11-29 DIAGNOSIS — H2513 Age-related nuclear cataract, bilateral: Secondary | ICD-10-CM | POA: Diagnosis not present

## 2020-11-29 DIAGNOSIS — H401123 Primary open-angle glaucoma, left eye, severe stage: Secondary | ICD-10-CM | POA: Diagnosis not present

## 2020-12-12 ENCOUNTER — Encounter: Payer: Self-pay | Admitting: Podiatry

## 2020-12-12 ENCOUNTER — Ambulatory Visit: Payer: PPO | Admitting: Podiatry

## 2020-12-12 ENCOUNTER — Other Ambulatory Visit: Payer: Self-pay

## 2020-12-12 DIAGNOSIS — M79674 Pain in right toe(s): Secondary | ICD-10-CM

## 2020-12-12 DIAGNOSIS — B351 Tinea unguium: Secondary | ICD-10-CM

## 2020-12-12 DIAGNOSIS — M79675 Pain in left toe(s): Secondary | ICD-10-CM | POA: Diagnosis not present

## 2020-12-18 NOTE — Progress Notes (Signed)
  Subjective:  Patient ID: Bryan Floyd, male    DOB: [birth year],  MRN: [id number]  70 y.o. male presents with thick, elongated toenails b/l feet which are tender when wearing enclosed shoe gear. Pain is relieved with periodic professional debridement.   Since his last visit, he went to visit his son and daughter in law in [place].  He voices no new pedal problems on today's visit. He continues to use the compounded topical antifungal nail solution from [place].  PCP: Harlan Stains, MD and last visit was: 06/20/2020.  Review of Systems: Negative except as noted in the HPI.   Allergies  Allergen Reactions   Atenolol     Fatigue Other reaction(s): fatigue   Pravastatin     myalgia   Pravastatin Sodium     Other reaction(s): myalgia    Objective:  There were no vitals filed for this visit. Constitutional Patient is a pleasant 70 y.o. African American male in NAD. AAO x 3.  Vascular Capillary fill time to digits immediate b/l.  DP/PT pulse(s) are palpable b/l lower extremities. Pedal hair present b/l feet. Lower extremity skin temperature gradient within normal limits. No pain with calf compression b/l. No edema noted b/l lower extremities. No cyanosis or clubbing noted.   Neurologic Protective sensation intact 5/5 intact bilaterally with 10g monofilament b/l. Vibratory sensation intact b/l. No clonus b/l.   Dermatologic Pedal skin is warm and supple b/l.  No open wounds b/l lower extremities. No interdigital macerations b/l lower extremities. Toenails 1-5 b/l elongated, discolored, dystrophic, thickened, crumbly with subungual debris and tenderness to dorsal palpation.   Orthopedic: Normal muscle strength 5/5 to all lower extremity muscle groups bilaterally. Patient ambulates independent of any assistive aids. No gross bony deformities b/l lower extremities.    Assessment:   1. Pain due to onychomycosis of toenails of both feet    Plan:  Patient was evaluated and  treated and all questions answered. Consent given for treatment as described below: -No new findings. No new orders. -Mycotic toenails were debrided in length and girth 1-5 bilaterally with sterile nail nippers and dremel without iatrogenic bleeding. Continue daily use of topical compounded antifungal medication from [place] as instructed. -Patient to report any pedal injuries to medical professional immediately. -Patient/POA to call should there be question/concern in the interim.  Return in about 3 months (around 03/14/2021).  Marzetta Board, DPM

## 2020-12-20 DIAGNOSIS — J309 Allergic rhinitis, unspecified: Secondary | ICD-10-CM | POA: Diagnosis not present

## 2020-12-20 DIAGNOSIS — D126 Benign neoplasm of colon, unspecified: Secondary | ICD-10-CM | POA: Diagnosis not present

## 2020-12-20 DIAGNOSIS — Z23 Encounter for immunization: Secondary | ICD-10-CM | POA: Diagnosis not present

## 2020-12-20 DIAGNOSIS — E785 Hyperlipidemia, unspecified: Secondary | ICD-10-CM | POA: Diagnosis not present

## 2020-12-20 DIAGNOSIS — I10 Essential (primary) hypertension: Secondary | ICD-10-CM | POA: Diagnosis not present

## 2020-12-20 DIAGNOSIS — Z1159 Encounter for screening for other viral diseases: Secondary | ICD-10-CM | POA: Diagnosis not present

## 2021-02-28 DIAGNOSIS — H401132 Primary open-angle glaucoma, bilateral, moderate stage: Secondary | ICD-10-CM | POA: Diagnosis not present

## 2021-02-28 DIAGNOSIS — H401123 Primary open-angle glaucoma, left eye, severe stage: Secondary | ICD-10-CM | POA: Diagnosis not present

## 2021-02-28 DIAGNOSIS — H2513 Age-related nuclear cataract, bilateral: Secondary | ICD-10-CM | POA: Diagnosis not present

## 2021-03-25 ENCOUNTER — Ambulatory Visit: Payer: PPO | Admitting: Podiatry

## 2021-03-25 ENCOUNTER — Encounter: Payer: Self-pay | Admitting: Podiatry

## 2021-03-25 ENCOUNTER — Other Ambulatory Visit: Payer: Self-pay

## 2021-03-25 DIAGNOSIS — B351 Tinea unguium: Secondary | ICD-10-CM | POA: Diagnosis not present

## 2021-03-25 DIAGNOSIS — M79674 Pain in right toe(s): Secondary | ICD-10-CM | POA: Diagnosis not present

## 2021-03-25 DIAGNOSIS — M79675 Pain in left toe(s): Secondary | ICD-10-CM | POA: Diagnosis not present

## 2021-03-25 DIAGNOSIS — L600 Ingrowing nail: Secondary | ICD-10-CM

## 2021-03-29 DIAGNOSIS — N400 Enlarged prostate without lower urinary tract symptoms: Secondary | ICD-10-CM | POA: Diagnosis not present

## 2021-03-31 DIAGNOSIS — J309 Allergic rhinitis, unspecified: Secondary | ICD-10-CM | POA: Insufficient documentation

## 2021-03-31 NOTE — Progress Notes (Signed)
Subjective: Bryan Floyd is a 71 y.o. male patient seen today for follow up of  painful elongated mycotic toenails 1-5 bilaterally which are tender when wearing enclosed shoe gear. Pain is relieved with periodic professional debridement..   New problem(s)/concern(s) today:  ingrown toenail to the bilateral great toes  PCP is Harlan Stains, MD. Last visit was: 01/23/2021.  Allergies  Allergen Reactions   Atenolol     Fatigue Other reaction(s): fatigue   Pravastatin     myalgia   Pravastatin Sodium     Other reaction(s): myalgia    Objective: Physical Exam  General: Patient is a pleasant 71 y.o. African American male WD, WN in NAD. AAO x 3.   Neurovascular Examination: Capillary refill time to digits immediate b/l. Palpable pedal pulses b/l LE. Pedal hair present. Lower extremity skin temperature gradient within normal limits. No edema noted b/l LE. No cyanosis or clubbing noted b/l LE.  Protective sensation intact 5/5 intact bilaterally with 10g monofilament b/l. Vibratory sensation intact b/l.  Dermatological:  Pedal integument with normal turgor, texture and tone BLE. No open wounds b/l LE. No interdigital macerations noted b/l LE. Toenails 1-5 b/l elongated, discolored, dystrophic, thickened, crumbly with subungual debris and tenderness to dorsal palpation. Incurvated nailplate medial border(s) bilateral great toes.  Nail border hypertrophy minimal. There is tenderness to palpation. Sign(s) of infection: no clinical signs of infection noted on examination today..  Musculoskeletal:  Muscle strength 5/5 to all lower extremity muscle groups bilaterally. No pain, crepitus or joint limitation noted with ROM bilateral LE. No gross bony deformities bilaterally.  Assessment: 1. Pain due to onychomycosis of toenails of both feet   2. Ingrown toenail without infection    Plan: -Examined patient. -Toenails 1-5 b/l were debrided in length and girth with sterile nail nippers and  dremel without iatrogenic bleeding.  -Offending nail border debrided and curretaged bilateral great toes utilizing sterile nail nipper and currette. Border cleansed with alcohol and triple antibiotic applied. No further treatment required by patient/caregiver. -Patient/POA to call should there be question/concern in the interim.  Return in about 3 months (around 06/23/2021).  Marzetta Board, DPM

## 2021-05-07 DIAGNOSIS — H401123 Primary open-angle glaucoma, left eye, severe stage: Secondary | ICD-10-CM | POA: Diagnosis not present

## 2021-05-07 DIAGNOSIS — H401132 Primary open-angle glaucoma, bilateral, moderate stage: Secondary | ICD-10-CM | POA: Diagnosis not present

## 2021-05-07 DIAGNOSIS — H2513 Age-related nuclear cataract, bilateral: Secondary | ICD-10-CM | POA: Diagnosis not present

## 2021-05-17 DIAGNOSIS — K5730 Diverticulosis of large intestine without perforation or abscess without bleeding: Secondary | ICD-10-CM | POA: Diagnosis not present

## 2021-05-17 DIAGNOSIS — Z86010 Personal history of colonic polyps: Secondary | ICD-10-CM | POA: Diagnosis not present

## 2021-05-17 DIAGNOSIS — K649 Unspecified hemorrhoids: Secondary | ICD-10-CM | POA: Diagnosis not present

## 2021-05-17 DIAGNOSIS — Z980 Intestinal bypass and anastomosis status: Secondary | ICD-10-CM | POA: Diagnosis not present

## 2021-06-24 ENCOUNTER — Ambulatory Visit: Payer: PPO | Admitting: Podiatry

## 2021-06-24 ENCOUNTER — Encounter: Payer: Self-pay | Admitting: Podiatry

## 2021-06-24 DIAGNOSIS — M79674 Pain in right toe(s): Secondary | ICD-10-CM | POA: Diagnosis not present

## 2021-06-24 DIAGNOSIS — M79675 Pain in left toe(s): Secondary | ICD-10-CM

## 2021-06-24 DIAGNOSIS — B351 Tinea unguium: Secondary | ICD-10-CM | POA: Diagnosis not present

## 2021-06-24 DIAGNOSIS — Z860100 Personal history of colon polyps, unspecified: Secondary | ICD-10-CM | POA: Insufficient documentation

## 2021-07-02 DIAGNOSIS — Z9049 Acquired absence of other specified parts of digestive tract: Secondary | ICD-10-CM | POA: Diagnosis not present

## 2021-07-02 DIAGNOSIS — E785 Hyperlipidemia, unspecified: Secondary | ICD-10-CM | POA: Diagnosis not present

## 2021-07-02 DIAGNOSIS — D649 Anemia, unspecified: Secondary | ICD-10-CM | POA: Diagnosis not present

## 2021-07-02 DIAGNOSIS — N400 Enlarged prostate without lower urinary tract symptoms: Secondary | ICD-10-CM | POA: Diagnosis not present

## 2021-07-02 DIAGNOSIS — I10 Essential (primary) hypertension: Secondary | ICD-10-CM | POA: Diagnosis not present

## 2021-07-02 DIAGNOSIS — Z0000 Encounter for general adult medical examination without abnormal findings: Secondary | ICD-10-CM | POA: Diagnosis not present

## 2021-07-02 DIAGNOSIS — J309 Allergic rhinitis, unspecified: Secondary | ICD-10-CM | POA: Diagnosis not present

## 2021-07-02 DIAGNOSIS — Z23 Encounter for immunization: Secondary | ICD-10-CM | POA: Diagnosis not present

## 2021-07-02 DIAGNOSIS — E041 Nontoxic single thyroid nodule: Secondary | ICD-10-CM | POA: Diagnosis not present

## 2021-07-02 NOTE — Progress Notes (Signed)
?  Subjective:  ?Patient ID: Bryan Floyd, male    DOB: 04/27/1950,  MRN: [id number] ? ?Bryan Floyd presents to clinic today for painful thick toenails that are difficult to trim. Pain interferes with ambulation. Aggravating factors include wearing enclosed shoe gear. Pain is relieved with periodic professional debridement. ? ?Patient states his right great toe is tender and points to the lateral border. Denies any redness, drainage or swelling. ? ?PCP is Harlan Stains, MD , and last visit was December 20, 2020. ? ?Allergies  ?Allergen Reactions  ? Atenolol   ?  Fatigue ?Other reaction(s): fatigue  ? Pravastatin   ?  myalgia  ? Pravastatin Sodium   ?  Other reaction(s): myalgia  ? ? ?Review of Systems: Negative except as noted in the HPI. ? ?Objective: ?Bryan Floyd is a 71 y.o. male, WD, WN in NAD. AAO x 3. ? ?Vascular Examination: ?Palpable pedal pulses b/l LE. Digital hair present b/l. No pedal edema b/l. Skin temperature gradient WNL b/l. No varicosities b/l. Capillary refill time to digits immediate b/l.. ? ?Dermatological Examination: ?Pedal skin with normal turgor, texture and tone b/l. No open wounds. No interdigital macerations b/l. Toenails 1-5 b/l thickened, discolored, dystrophic with subungual debris. There is pain on palpation to dorsal aspect of nailplates. Incurvated nailplate lateral border right hallux.  Nail border hypertrophy absent. There is tenderness to palpation. Sign(s) of infection: no clinical signs of infection noted on examination today. ? ?Neurological Examination: ?Protective sensation intact with 10 gram monofilament b/l LE. Vibratory sensation intact b/l LE.  ? ?Musculoskeletal Examination: ?Muscle strength 5/5 to all LE muscle groups b/l. No pain, crepitus or joint limitation noted with ROM bilateral LE. No gross bony deformities bilaterally. Patient ambulates independent of any assistive aids. Wearing appropriate fitting shoe gear. ? ?Assessment/Plan: ?1. Pain due to  onychomycosis of toenails of both feet   ?  ?-Patient was evaluated and treated. All patient's and/or POA's questions/concerns answered on today's visit. ?-Mycotic toenails 2-5 bilaterally and L hallux were debrided in length and girth with sterile nail nippers and dremel without iatrogenic bleeding. ?-Offending nail border debrided and curretaged right great toe utilizing sterile nail nipper and currette. Border(s) cleansed with alcohol and TAO applied. Patient instructed to apply Neosporin Cream  to right great toe once daily for 7 days. ?-Patient/POA to call should there be question/concern in the interim.  ? ?Return in about 3 months (around 09/24/2021). ? ?Marzetta Board, DPM  ?

## 2021-07-09 DIAGNOSIS — H401123 Primary open-angle glaucoma, left eye, severe stage: Secondary | ICD-10-CM | POA: Diagnosis not present

## 2021-07-09 DIAGNOSIS — H2513 Age-related nuclear cataract, bilateral: Secondary | ICD-10-CM | POA: Diagnosis not present

## 2021-07-09 DIAGNOSIS — H401132 Primary open-angle glaucoma, bilateral, moderate stage: Secondary | ICD-10-CM | POA: Diagnosis not present

## 2021-09-10 DIAGNOSIS — H2513 Age-related nuclear cataract, bilateral: Secondary | ICD-10-CM | POA: Diagnosis not present

## 2021-09-10 DIAGNOSIS — H401123 Primary open-angle glaucoma, left eye, severe stage: Secondary | ICD-10-CM | POA: Diagnosis not present

## 2021-09-10 DIAGNOSIS — H401132 Primary open-angle glaucoma, bilateral, moderate stage: Secondary | ICD-10-CM | POA: Diagnosis not present

## 2021-09-25 ENCOUNTER — Ambulatory Visit: Payer: PPO | Admitting: Podiatry

## 2021-09-25 ENCOUNTER — Encounter: Payer: Self-pay | Admitting: Podiatry

## 2021-09-25 DIAGNOSIS — L600 Ingrowing nail: Secondary | ICD-10-CM | POA: Diagnosis not present

## 2021-09-25 DIAGNOSIS — B351 Tinea unguium: Secondary | ICD-10-CM

## 2021-09-25 DIAGNOSIS — M79674 Pain in right toe(s): Secondary | ICD-10-CM | POA: Diagnosis not present

## 2021-09-25 DIAGNOSIS — M79675 Pain in left toe(s): Secondary | ICD-10-CM | POA: Diagnosis not present

## 2021-10-02 NOTE — Progress Notes (Signed)
  Subjective:  Patient ID: Bryan Floyd, male    DOB: 12-08-1950,  MRN: [id number]  Bryan Floyd presents to clinic today for painful elongated mycotic toenails 1-5 bilaterally which are tender when wearing enclosed shoe gear. Pain is relieved with periodic professional debridement.  Patient states right great toe feels ingrown today. Denies any redness, drainage or swelling.  PCP is Harlan Stains, MD , and last visit was Jul 02, 2021.  Allergies  Allergen Reactions   Atenolol     Fatigue Other reaction(s): fatigue   Pravastatin     myalgia   Pravastatin Sodium     Other reaction(s): myalgia    Review of Systems: Negative except as noted in the HPI.  Objective: No changes noted in today's physical examination.  Vascular Examination: Palpable pedal pulses b/l LE. Digital hair present b/l. No pedal edema b/l. Skin temperature gradient WNL b/l. No varicosities b/l. Capillary refill time to digits immediate b/l.Marland Kitchen  Dermatological Examination: Pedal skin with normal turgor, texture and tone b/l. No open wounds. No interdigital macerations b/l. Toenails 1-5 b/l thickened, discolored, dystrophic with subungual debris. There is pain on palpation to dorsal aspect of nailplates. Incurvated nailplate medial border left hallux and lateral border right hallux.  Nail border hypertrophy absent. There is tenderness to palpation. Sign(s) of infection: no clinical signs of infection noted on examination today...  Neurological Examination: Protective sensation intact with 10 gram monofilament b/l LE. Vibratory sensation intact b/l LE.   Musculoskeletal Examination: Muscle strength 5/5 to all LE muscle groups b/l. No pain, crepitus or joint limitation noted with ROM bilateral LE. No gross bony deformities bilaterally.  Assessment/Plan: 1. Pain due to onychomycosis of toenails of both feet   2. Ingrown toenail without infection   -Examined patient. -Toenails 2-5 bilaterally debrided in  length and girth without iatrogenic bleeding with sterile nail nipper and dremel.  -Offending nail border debrided and curretaged bilateral great toes utilizing sterile nail nipper and currette. Border(s) cleansed with alcohol and TAO applied. Patient/POA/Caregiver/Facility instructed to apply Neosporin Cream  to bilateral great toes once daily for 7 days. Call office if there are any concerns. -Patient/POA to call should there be question/concern in the interim.   Return in about 3 months (around 12/26/2021).  Marzetta Board, DPM

## 2021-10-30 DIAGNOSIS — H401123 Primary open-angle glaucoma, left eye, severe stage: Secondary | ICD-10-CM | POA: Diagnosis not present

## 2021-10-30 DIAGNOSIS — H2513 Age-related nuclear cataract, bilateral: Secondary | ICD-10-CM | POA: Diagnosis not present

## 2021-10-30 DIAGNOSIS — H401132 Primary open-angle glaucoma, bilateral, moderate stage: Secondary | ICD-10-CM | POA: Diagnosis not present

## 2022-01-06 DIAGNOSIS — I10 Essential (primary) hypertension: Secondary | ICD-10-CM | POA: Diagnosis not present

## 2022-01-06 DIAGNOSIS — E010 Iodine-deficiency related diffuse (endemic) goiter: Secondary | ICD-10-CM | POA: Diagnosis not present

## 2022-01-06 DIAGNOSIS — Z125 Encounter for screening for malignant neoplasm of prostate: Secondary | ICD-10-CM | POA: Diagnosis not present

## 2022-01-06 DIAGNOSIS — E785 Hyperlipidemia, unspecified: Secondary | ICD-10-CM | POA: Diagnosis not present

## 2022-01-07 ENCOUNTER — Encounter: Payer: Self-pay | Admitting: Podiatry

## 2022-01-07 ENCOUNTER — Ambulatory Visit: Payer: PPO | Admitting: Podiatry

## 2022-01-07 DIAGNOSIS — M79675 Pain in left toe(s): Secondary | ICD-10-CM

## 2022-01-07 DIAGNOSIS — L600 Ingrowing nail: Secondary | ICD-10-CM | POA: Diagnosis not present

## 2022-01-07 DIAGNOSIS — M79674 Pain in right toe(s): Secondary | ICD-10-CM

## 2022-01-07 DIAGNOSIS — B351 Tinea unguium: Secondary | ICD-10-CM | POA: Diagnosis not present

## 2022-01-12 NOTE — Progress Notes (Signed)
  Subjective:  Patient ID: Bryan Floyd, male    DOB: 07-Feb-1951,  MRN: [id number]  [name] presents to clinic today for painful elongated mycotic toenails 1-5 bilaterally which are tender when wearing enclosed shoe gear. Pain is relieved with periodic professional debridement.  Chief Complaint  Patient presents with   Nail Problem    Nail Trim  Not Diabetic PCP - Dr Dema Severin , last OV 12/27/21   New problem(s): None.   PCP is Harlan Stains, MD.  Allergies  Allergen Reactions   Atenolol     Fatigue Other reaction(s): fatigue   Pravastatin     myalgia   Pravastatin Sodium     Other reaction(s): myalgia    Review of Systems: Negative except as noted in the HPI.  Objective: No changes noted in today's physical examination.  Bryan Floyd is a pleasant 71 y.o. male in NAD. AAO x 3.  Vascular Examination: Palpable pedal pulses b/l LE. Digital hair present b/l. No pedal edema b/l. Skin temperature gradient WNL b/l. No varicosities b/l. Capillary refill time to digits immediate b/l.Marland Kitchen  Dermatological Examination: Pedal skin with normal turgor, texture and tone b/l. No open wounds. No interdigital macerations b/l. Toenails 1-5 b/l thickened, discolored, dystrophic with subungual debris. There is pain on palpation to dorsal aspect of nailplates.   Incurvated nailplate medial border left hallux and lateral border right hallux.  Nail border hypertrophy absent. There is tenderness to palpation. Sign(s) of infection: no clinical signs of infection noted on examination today.  Neurological Examination: Protective sensation intact with 10 gram monofilament b/l LE. Vibratory sensation intact b/l LE.   Musculoskeletal Examination: Muscle strength 5/5 to all LE muscle groups b/l. No pain, crepitus or joint limitation noted with ROM bilateral LE. No gross bony deformities bilaterally.  Assessment/Plan: 1. Pain due to onychomycosis of toenails of both feet   2. Ingrown toenail  without infection     No orders of the defined types were placed in this encounter.   -Consent given for treatment as described below: -Mycotic toenails 2-5 left foot and 2-5 right foot were debrided in length and girth with sterile nail nippers and dremel without iatrogenic bleeding. -No invasive procedure(s) performed. Offending nail border debrided and curretaged bilateral great toes utilizing sterile nail nipper and currette. Border(s) cleansed with alcohol and triple antibiotic ointment applied. Patient/POA/Caregiver/Facility instructed to apply Neosporin Cream  to bilateral great toes once daily for 7 days. Call office if there are any concerns. -Patient/POA to call should there be question/concern in the interim.   Return in about 3 months (around 04/09/2022).  Marzetta Board, DPM

## 2022-02-05 DIAGNOSIS — H2513 Age-related nuclear cataract, bilateral: Secondary | ICD-10-CM | POA: Diagnosis not present

## 2022-02-05 DIAGNOSIS — H401132 Primary open-angle glaucoma, bilateral, moderate stage: Secondary | ICD-10-CM | POA: Diagnosis not present

## 2022-02-05 DIAGNOSIS — H401123 Primary open-angle glaucoma, left eye, severe stage: Secondary | ICD-10-CM | POA: Diagnosis not present

## 2022-04-02 DIAGNOSIS — N400 Enlarged prostate without lower urinary tract symptoms: Secondary | ICD-10-CM | POA: Diagnosis not present

## 2022-04-22 ENCOUNTER — Encounter: Payer: Self-pay | Admitting: Podiatry

## 2022-04-22 ENCOUNTER — Ambulatory Visit: Payer: PPO | Admitting: Podiatry

## 2022-04-22 VITALS — BP 160/77

## 2022-04-22 DIAGNOSIS — B351 Tinea unguium: Secondary | ICD-10-CM | POA: Diagnosis not present

## 2022-04-22 DIAGNOSIS — M79675 Pain in left toe(s): Secondary | ICD-10-CM

## 2022-04-22 DIAGNOSIS — M79674 Pain in right toe(s): Secondary | ICD-10-CM

## 2022-04-22 NOTE — Progress Notes (Unsigned)
  Subjective:  Patient ID: Bryan Floyd, male    DOB: [birth year],  MRN: [id number]  Bryan Floyd presents to clinic today for {jgcomplaint:23593}  Chief Complaint  Patient presents with   Nail Problem    [facility] PCP-White, Bryan Floyd  PCP [facility]- 4 months ago   New problem(s): None. {jgcomplaint:23593}  PCP is Bryan Stains, MD.  Allergies  Allergen Reactions   Atenolol     Fatigue Other reaction(s): fatigue   Pravastatin     myalgia   Pravastatin Sodium     Other reaction(s): myalgia    Review of Systems: Negative except as noted in the HPI.  Objective: No changes noted in today's physical examination. Vitals:   04/22/22 1454  BP: (!) 160/77   Bryan Floyd is a pleasant 72 y.o. male {jgbodyhabitus:24098} AAO x 3. Vascular Examination: Palpable pedal pulses b/l LE. Digital hair present b/l. No pedal edema b/l. Skin temperature gradient WNL b/l. No varicosities b/l. Capillary refill time to digits immediate b/l.Marland Kitchen  Dermatological Examination: Pedal skin with normal turgor, texture and tone b/l. No open wounds. No interdigital macerations b/l. Toenails 1-5 b/l thickened, discolored, dystrophic with subungual debris. There is pain on palpation to dorsal aspect of nailplates.   Incurvated nailplate medial border left hallux and lateral border right hallux.  Nail border hypertrophy absent. There is tenderness to palpation. Sign(s) of infection: no clinical signs of infection noted on examination today.  Neurological Examination: Protective sensation intact with 10 gram monofilament b/l LE. Vibratory sensation intact b/l LE.   Musculoskeletal Examination: Muscle strength 5/5 to all LE muscle groups b/l. No pain, crepitus or joint limitation noted with ROM bilateral LE. No gross bony deformities bilaterally.  Assessment/Plan: 1. Pain due to onychomycosis of toenails of both feet     No orders of the defined types were placed in this encounter.    None {Jgplan:23602::"-Patient/POA to call should there be question/concern in the interim."}   Return in about 3 months (around 07/21/2022).  Marzetta Board, DPM

## 2022-07-02 DIAGNOSIS — H401132 Primary open-angle glaucoma, bilateral, moderate stage: Secondary | ICD-10-CM | POA: Diagnosis not present

## 2022-07-02 DIAGNOSIS — H2513 Age-related nuclear cataract, bilateral: Secondary | ICD-10-CM | POA: Diagnosis not present

## 2022-07-02 DIAGNOSIS — H401123 Primary open-angle glaucoma, left eye, severe stage: Secondary | ICD-10-CM | POA: Diagnosis not present

## 2022-07-16 DIAGNOSIS — Z23 Encounter for immunization: Secondary | ICD-10-CM | POA: Diagnosis not present

## 2022-07-16 DIAGNOSIS — N400 Enlarged prostate without lower urinary tract symptoms: Secondary | ICD-10-CM | POA: Diagnosis not present

## 2022-07-16 DIAGNOSIS — J309 Allergic rhinitis, unspecified: Secondary | ICD-10-CM | POA: Diagnosis not present

## 2022-07-16 DIAGNOSIS — Z9049 Acquired absence of other specified parts of digestive tract: Secondary | ICD-10-CM | POA: Diagnosis not present

## 2022-07-16 DIAGNOSIS — Z0000 Encounter for general adult medical examination without abnormal findings: Secondary | ICD-10-CM | POA: Diagnosis not present

## 2022-07-16 DIAGNOSIS — E785 Hyperlipidemia, unspecified: Secondary | ICD-10-CM | POA: Diagnosis not present

## 2022-07-16 DIAGNOSIS — M545 Low back pain, unspecified: Secondary | ICD-10-CM | POA: Diagnosis not present

## 2022-07-16 DIAGNOSIS — E041 Nontoxic single thyroid nodule: Secondary | ICD-10-CM | POA: Diagnosis not present

## 2022-07-16 DIAGNOSIS — I10 Essential (primary) hypertension: Secondary | ICD-10-CM | POA: Diagnosis not present

## 2022-07-17 DIAGNOSIS — H2513 Age-related nuclear cataract, bilateral: Secondary | ICD-10-CM | POA: Diagnosis not present

## 2022-07-17 DIAGNOSIS — H401123 Primary open-angle glaucoma, left eye, severe stage: Secondary | ICD-10-CM | POA: Diagnosis not present

## 2022-07-17 DIAGNOSIS — H401132 Primary open-angle glaucoma, bilateral, moderate stage: Secondary | ICD-10-CM | POA: Diagnosis not present

## 2022-07-29 DIAGNOSIS — H2513 Age-related nuclear cataract, bilateral: Secondary | ICD-10-CM | POA: Diagnosis not present

## 2022-07-29 DIAGNOSIS — H401133 Primary open-angle glaucoma, bilateral, severe stage: Secondary | ICD-10-CM | POA: Diagnosis not present

## 2022-07-30 DIAGNOSIS — H2513 Age-related nuclear cataract, bilateral: Secondary | ICD-10-CM | POA: Insufficient documentation

## 2022-07-30 DIAGNOSIS — H401133 Primary open-angle glaucoma, bilateral, severe stage: Secondary | ICD-10-CM | POA: Insufficient documentation

## 2022-08-11 DIAGNOSIS — H2512 Age-related nuclear cataract, left eye: Secondary | ICD-10-CM | POA: Diagnosis not present

## 2022-08-11 DIAGNOSIS — H401123 Primary open-angle glaucoma, left eye, severe stage: Secondary | ICD-10-CM | POA: Diagnosis not present

## 2022-08-20 ENCOUNTER — Ambulatory Visit (INDEPENDENT_AMBULATORY_CARE_PROVIDER_SITE_OTHER): Payer: PPO | Admitting: Podiatry

## 2022-08-20 ENCOUNTER — Encounter: Payer: Self-pay | Admitting: Podiatry

## 2022-08-20 VITALS — BP 133/71

## 2022-08-20 DIAGNOSIS — B351 Tinea unguium: Secondary | ICD-10-CM

## 2022-08-20 DIAGNOSIS — M79675 Pain in left toe(s): Secondary | ICD-10-CM | POA: Diagnosis not present

## 2022-08-20 DIAGNOSIS — M79674 Pain in right toe(s): Secondary | ICD-10-CM

## 2022-08-24 NOTE — Progress Notes (Signed)
  Subjective:  Patient ID: Bryan Floyd, male    DOB: 1951/01/21,  MRN: [id number]  Chirag Daigneault presents to clinic today for: painful thick toenails that are difficult to trim. Pain interferes with ambulation. Aggravating factors include wearing enclosed shoe gear. Pain is relieved with periodic professional debridement. He is accompanied by his wife on today's visit. Chief Complaint  Patient presents with   Nail Problem    RFC,Referring Provider Laurann Montana, MD,LOV:11/23       PCP is Laurann Montana, MD.  Allergies  Allergen Reactions   Atenolol Other (See Comments)    Fatigue  Other reaction(s): fatigue  Fatigue Other reaction(s): fatigue   Pravastatin Other (See Comments)    myalgia  myalgia    Other reaction(s): myalgia   Pravastatin Sodium Other (See Comments)    Other reaction(s): myalgia    Review of Systems: Negative except as noted in the HPI.  Objective: No changes noted in today's physical examination. Vitals:   08/20/22 1450 08/20/22 1457  BP: (!) 160/75 133/71   Jahlil Blanke is a pleasant 72 y.o. male in NAD. AAO x 3.  Vascular Examination: Capillary refill time <3 seconds b/l LE. Palpable pedal pulses b/l LE. Digital hair present b/l. No pedal edema b/l. Skin temperature gradient WNL b/l. No varicosities b/l. Marland Kitchen  Dermatological Examination: Pedal skin with normal turgor, texture and tone b/l. No open wounds. No interdigital macerations b/l. Toenails 1-5 b/l thickened, discolored, dystrophic with subungual debris. There is pain on palpation to dorsal aspect of nailplates. .  Neurological Examination: Protective sensation intact with 10 gram monofilament b/l LE. Vibratory sensation intact b/l LE.   Musculoskeletal Examination: Normal muscle strength 5/5 to all lower extremity muscle groups bilaterally. No pain, crepitus or joint limitation noted with ROM b/l LE. No gross bony pedal deformities b/l. Patient ambulates independently without  assistive aids.  Assessment/Plan: 1. Pain due to onychomycosis of toenails of both feet    Patient was evaluated and treated. All patient's and/or POA's questions/concerns addressed on today's visit. Mycotic toenails 1-5 debrided in length and girth without incident. Continue soft, supportive shoe gear daily. Report any pedal injuries to medical professional. Call office if there are any quesitons/concerns. -Patient/POA to call should there be question/concern in the interim.   Return in about 3 months (around 11/20/2022).  Freddie Breech, DPM

## 2022-11-26 ENCOUNTER — Encounter: Payer: Self-pay | Admitting: Podiatry

## 2022-11-26 ENCOUNTER — Ambulatory Visit (INDEPENDENT_AMBULATORY_CARE_PROVIDER_SITE_OTHER): Payer: PPO | Admitting: Podiatry

## 2022-11-26 DIAGNOSIS — M79675 Pain in left toe(s): Secondary | ICD-10-CM | POA: Diagnosis not present

## 2022-11-26 DIAGNOSIS — B351 Tinea unguium: Secondary | ICD-10-CM | POA: Diagnosis not present

## 2022-11-26 DIAGNOSIS — M79674 Pain in right toe(s): Secondary | ICD-10-CM | POA: Diagnosis not present

## 2022-11-30 NOTE — Progress Notes (Signed)
Subjective:  Patient ID: Bryan Floyd, male    DOB: [birth year],  MRN: [id number]  Bryan Floyd presents to clinic today for: painful elongated mycotic toenails 1-5 bilaterally which are tender when wearing enclosed shoe gear. Pain is relieved with periodic professional debridement.  Chief Complaint  Patient presents with   RFC     RFC     PCP is Laurann Montana, MD.  Allergies  Allergen Reactions   Atenolol Other (See Comments)    Fatigue  Other reaction(s): fatigue  Fatigue Other reaction(s): fatigue   Pravastatin Other (See Comments)    myalgia  myalgia    Other reaction(s): myalgia   Pravastatin Sodium Other (See Comments)    Other reaction(s): myalgia    Review of Systems: Negative except as noted in the HPI.  Objective: No changes noted in today's physical examination. There were no vitals filed for this visit.  Bryan Floyd is a pleasant 72 y.o. male in NAD. AAO x 3.  Vascular Examination: Capillary refill time <3 seconds b/l LE. Palpable pedal pulses b/l LE. Digital hair present b/l. No pedal edema b/l. Skin temperature gradient WNL b/l. No varicosities b/l. Marland Kitchen  Dermatological Examination: Pedal skin with normal turgor, texture and tone b/l. No open wounds. No interdigital macerations b/l. Toenails 1-5 b/l thickened, discolored, dystrophic with subungual debris. There is pain on palpation to dorsal aspect of nailplates. No corns, calluses nor porokeratotic lesions noted..  Neurological Examination: Protective sensation intact with 10 gram monofilament b/l LE. Vibratory sensation intact b/l LE.   Musculoskeletal Examination: Muscle strength 5/5 to all lower extremity muscle groups bilaterally. No pain, crepitus or joint limitation noted with ROM bilateral LE. No gross bony deformities bilaterally.  Assessment/Plan: 1. Pain due to onychomycosis of toenails of both feet    -Consent given for treatment as described below: -Examined patient. -No new  findings. No new orders. -Continue supportive shoe gear daily. -Toenails 1-5 b/l were debrided in length and girth with sterile nail nippers and dremel without iatrogenic bleeding.  -Patient/POA to call should there be question/concern in the interim.   Return in about 3 months (around 02/26/2023).  Bryan Floyd, DPM

## 2023-01-13 DIAGNOSIS — H401133 Primary open-angle glaucoma, bilateral, severe stage: Secondary | ICD-10-CM | POA: Diagnosis not present

## 2023-01-16 DIAGNOSIS — G5603 Carpal tunnel syndrome, bilateral upper limbs: Secondary | ICD-10-CM | POA: Diagnosis not present

## 2023-01-16 DIAGNOSIS — E785 Hyperlipidemia, unspecified: Secondary | ICD-10-CM | POA: Diagnosis not present

## 2023-01-16 DIAGNOSIS — N400 Enlarged prostate without lower urinary tract symptoms: Secondary | ICD-10-CM | POA: Diagnosis not present

## 2023-01-16 DIAGNOSIS — I10 Essential (primary) hypertension: Secondary | ICD-10-CM | POA: Diagnosis not present

## 2023-01-16 DIAGNOSIS — B356 Tinea cruris: Secondary | ICD-10-CM | POA: Diagnosis not present

## 2023-02-10 DIAGNOSIS — M79641 Pain in right hand: Secondary | ICD-10-CM | POA: Diagnosis not present

## 2023-02-10 DIAGNOSIS — M79642 Pain in left hand: Secondary | ICD-10-CM | POA: Diagnosis not present

## 2023-02-17 ENCOUNTER — Encounter: Payer: Self-pay | Admitting: Neurology

## 2023-03-11 ENCOUNTER — Encounter: Payer: Self-pay | Admitting: Podiatry

## 2023-03-11 ENCOUNTER — Ambulatory Visit: Payer: PPO | Admitting: Podiatry

## 2023-03-11 VITALS — Ht 70.0 in | Wt 242.0 lb

## 2023-03-11 DIAGNOSIS — M79674 Pain in right toe(s): Secondary | ICD-10-CM | POA: Diagnosis not present

## 2023-03-11 DIAGNOSIS — M79675 Pain in left toe(s): Secondary | ICD-10-CM | POA: Diagnosis not present

## 2023-03-11 DIAGNOSIS — B351 Tinea unguium: Secondary | ICD-10-CM | POA: Diagnosis not present

## 2023-03-19 ENCOUNTER — Ambulatory Visit: Payer: PPO | Admitting: Neurology

## 2023-03-19 DIAGNOSIS — M79674 Pain in right toe(s): Secondary | ICD-10-CM

## 2023-03-19 DIAGNOSIS — G5623 Lesion of ulnar nerve, bilateral upper limbs: Secondary | ICD-10-CM

## 2023-03-19 DIAGNOSIS — B351 Tinea unguium: Secondary | ICD-10-CM | POA: Diagnosis not present

## 2023-03-19 DIAGNOSIS — M79675 Pain in left toe(s): Secondary | ICD-10-CM | POA: Diagnosis not present

## 2023-03-19 DIAGNOSIS — G5603 Carpal tunnel syndrome, bilateral upper limbs: Secondary | ICD-10-CM

## 2023-03-19 NOTE — Addendum Note (Signed)
Addended by: Karl Luke A on: 03/19/2023 07:57 AM   Modules accepted: Orders

## 2023-03-19 NOTE — Procedures (Signed)
[facility]  [address] [place], [address]  [place], [place] [address] Tel: [phone] Fax: [phone] Test Date:  03/19/2023  Patient: Bryan Floyd DOB: 01/18/1951 Physician: Nita Sickle, DO  Sex: Male Height: 5\' 10"  Ref Phys: Hurman Horn, MD  ID#: [id number]   Technician:    History: This is a 73 year old man referred for evaluation of bilateral hand paresthesias, worse on the left.  NCV & EMG Findings: Extensive electrodiagnostic testing of the right upper extremity and additional studies of the left shows:  Bilateral median sensory responses are absent.  Right ulnar sensory response shows prolonged latency (4.8 ms).  Left ulnar sensory response shows prolonged latency (4.0 ms) and reduced amplitude (4.1 V).  Bilateral radial sensory responses are within normal limits. Right median motor response shows severely prolonged latency (8.8 ms).  Left median motor response is absent.  Bilateral ulnar motor responses show slowed conduction velocity across the elbow (A Elbow-B Elbow, L20, R43 m/s).   Chronic motor axon loss changes are seen affecting right abductor pollicis brevis and bilateral ulnar innervated muscles, without accompanying active denervation, worse on the left.  Despite maximal activation, no motor unit recruitment was seen in the left abductor pollicis brevis muscle.  Impression: Bilateral median neuropathy at or distal to the wrist, consistent with a clinical diagnosis of carpal tunnel syndrome.  Overall, these findings are severe in degree electrically and worse on the left.   Bilateral ulnar neuropathy with slowing across the elbow, predominantly demyelinating, which is severe on the left and moderate on the right.   ___________________________ Nita Sickle, DO    Nerve Conduction Studies   Stim Site NR Peak (ms) Norm Peak (ms) O-P Amp (V) Norm O-P Amp  Left Median Anti Sensory (2nd Digit)  32 C  Wrist *NR  <3.8  >10  Right Median Anti  Sensory (2nd Digit)  32 C  Wrist *NR  <3.8  >10  Left Radial Anti Sensory (Base 1st Digit)  32 C  Wrist    2.7 <2.8 18.6 >10  Right Radial Anti Sensory (Base 1st Digit)  32 C  Wrist    2.3 <2.8 19.7 >10  Left Ulnar Anti Sensory (5th Digit)  32 C  Wrist    *4.0 <3.2 *4.1 >5  Right Ulnar Anti Sensory (5th Digit)  32 C  Wrist    *4.8 <3.2 7.2 >5     Stim Site NR Onset (ms) Norm Onset (ms) O-P Amp (mV) Norm O-P Amp Site1 Site2 Delta-0 (ms) Dist (cm) Vel (m/s) Norm Vel (m/s)  Left Median Motor (Abd Poll Brev)  32 C  Wrist *NR  <4.0  >5 Elbow Wrist  0.0  >50  Elbow *NR            Right Median Motor (Abd Poll Brev)  32 C  Wrist    *8.8 <4.0 5.4 >5 Elbow Wrist 6.0 33.0 55 >50  Elbow    14.8  5.2         Left Ulnar Motor (Abd Dig Minimi)  32 C  Wrist    3.0 <3.1 7.6 >7 B Elbow Wrist 4.9 28.0 57 >50  B Elbow    7.9  6.2  A Elbow B Elbow 4.9 10.0 *20 >50  A Elbow    12.8  4.9         Right Ulnar Motor (Abd Dig Minimi)  32 C  Wrist    2.7 <3.1 8.4 >7 B Elbow Wrist 4.3 26.0 60 >50  B Elbow    7.0  7.4  A Elbow B Elbow 2.3 10.0 *43 >50  A Elbow    9.3  6.9          Electromyography   Side Muscle Ins.Act Fibs Fasc Recrt Amp Dur Poly Activation Comment  Right 1stDorInt Nml Nml Nml *1- *1+ *1+ *1+ Nml N/A  Right Abd Poll Brev Nml Nml Nml *2- *1+ *1+ *1+ Nml N/A  Right PronatorTeres Nml Nml Nml Nml Nml Nml Nml Nml N/A  Right Biceps Nml Nml Nml Nml Nml Nml Nml Nml N/A  Right Triceps Nml Nml Nml Nml Nml Nml Nml Nml N/A  Right Deltoid Nml Nml Nml Nml Nml Nml Nml Nml N/A  Right Abd Dig Min Nml Nml Nml *1- *1+ *1+ *1+ Nml N/A  Right FlexCarpiUln Nml Nml Nml *1- *1+ *1+ *1+ Nml N/A  Left 1stDorInt Nml Nml Nml *2- *1+ *1+ *1+ Nml N/A  Left Abd Poll Brev Nml Nml Nml *None *- *- *- Nml *ATR  Left PronatorTeres Nml Nml Nml Nml Nml Nml Nml Nml N/A  Left Biceps Nml Nml Nml Nml Nml Nml Nml Nml N/A  Left Triceps Nml Nml Nml Nml Nml Nml Nml Nml N/A  Left Deltoid Nml Nml Nml Nml Nml Nml Nml Nml  N/A  Left Abd Dig Min Nml Nml Nml *2- *1+ *1+ *1+ Nml N/A  Left FlexCarpiUln Nml Nml Nml *1- *1+ *1+ *1+ Nml N/A      Waveforms:

## 2023-03-20 NOTE — Progress Notes (Signed)
  Subjective:  Patient ID: Bryan Floyd, male    DOB: [birth year],  MRN: [id number]  73 y.o. male presents to clinic with  painful thick toenails that are difficult to trim. Pain interferes with ambulation. Aggravating factors include wearing enclosed shoe gear. Pain is relieved with periodic professional debridement.  Chief Complaint  Patient presents with   RFC   New problem(s): None   PCP is Laurann Montana, MD.  Allergies  Allergen Reactions   Atenolol Other (See Comments)    Fatigue  Other reaction(s): fatigue  Fatigue Other reaction(s): fatigue   Pravastatin Other (See Comments)    myalgia  myalgia    Other reaction(s): myalgia   Pravastatin Sodium Other (See Comments)    Other reaction(s): myalgia    Review of Systems: Negative except as noted in the HPI.   Objective:  Bryan Floyd is a pleasant 73 y.o. male WD, WN in NAD. AAO x 3.  Vascular Examination: Vascular status intact b/l with palpable pedal pulses. CFT immediate b/l. No edema. No pain with calf compression b/l. Skin temperature gradient WNL b/l. No cyanosis or clubbing noted b/l LE.  Neurological Examination: Sensation grossly intact b/l with 10 gram monofilament.  Dermatological Examination: Pedal skin with normal turgor, texture and tone b/l. Toenails 1-5 b/l thick, discolored, elongated with subungual debris and pain on dorsal palpation. No hyperkeratotic lesions noted b/l.   Musculoskeletal Examination: Muscle strength 5/5 to b/l LE. No pain, crepitus or joint limitation noted with ROM bilateral LE. No gross bony deformities bilaterally.  Radiographs: None  Last A1c:       No data to display           Assessment:   1. Pain due to onychomycosis of toenails of both feet    Plan:  Patient was evaluated and treated. All patient's and/or POA's questions/concerns addressed on today's visit. Mycotic toenails 1-5 debrided in length and girth without incident. Continue soft, supportive  shoe gear daily. Report any pedal injuries to medical professional. Call office if there are any quesitons/concerns. -Patient/POA to call should there be question/concern in the interim.  Return in about 3 months (around 06/09/2023).  Freddie Breech, DPM      [place] LOCATION: [address] [place] [address]                   Office [phone]   [facility] LOCATION: [address] [place], [place] [address] Office [phone]

## 2023-04-29 DIAGNOSIS — N401 Enlarged prostate with lower urinary tract symptoms: Secondary | ICD-10-CM | POA: Diagnosis not present

## 2023-04-30 DIAGNOSIS — G5602 Carpal tunnel syndrome, left upper limb: Secondary | ICD-10-CM | POA: Diagnosis not present

## 2023-04-30 DIAGNOSIS — G5623 Lesion of ulnar nerve, bilateral upper limbs: Secondary | ICD-10-CM | POA: Diagnosis not present

## 2023-04-30 DIAGNOSIS — G5601 Carpal tunnel syndrome, right upper limb: Secondary | ICD-10-CM | POA: Diagnosis not present

## 2023-06-18 DIAGNOSIS — H401123 Primary open-angle glaucoma, left eye, severe stage: Secondary | ICD-10-CM | POA: Diagnosis not present

## 2023-06-18 DIAGNOSIS — H401132 Primary open-angle glaucoma, bilateral, moderate stage: Secondary | ICD-10-CM | POA: Diagnosis not present

## 2023-06-18 DIAGNOSIS — H2513 Age-related nuclear cataract, bilateral: Secondary | ICD-10-CM | POA: Diagnosis not present

## 2023-06-22 ENCOUNTER — Encounter (HOSPITAL_COMMUNITY): Payer: Self-pay

## 2023-06-22 ENCOUNTER — Emergency Department (HOSPITAL_COMMUNITY)
Admission: EM | Admit: 2023-06-22 | Discharge: 2023-06-22 | Disposition: A | Discharge status: Home / Self Care | Attending: Emergency Medicine | Admitting: Emergency Medicine

## 2023-06-22 ENCOUNTER — Other Ambulatory Visit: Payer: Self-pay

## 2023-06-22 DIAGNOSIS — R001 Bradycardia, unspecified: Secondary | ICD-10-CM | POA: Diagnosis not present

## 2023-06-22 DIAGNOSIS — I10 Essential (primary) hypertension: Secondary | ICD-10-CM | POA: Insufficient documentation

## 2023-06-22 DIAGNOSIS — R7989 Other specified abnormal findings of blood chemistry: Secondary | ICD-10-CM | POA: Insufficient documentation

## 2023-06-22 DIAGNOSIS — Z79899 Other long term (current) drug therapy: Secondary | ICD-10-CM | POA: Insufficient documentation

## 2023-06-22 DIAGNOSIS — I959 Hypotension, unspecified: Secondary | ICD-10-CM | POA: Diagnosis not present

## 2023-06-22 DIAGNOSIS — R55 Syncope and collapse: Secondary | ICD-10-CM | POA: Diagnosis not present

## 2023-06-22 DIAGNOSIS — R231 Pallor: Secondary | ICD-10-CM | POA: Diagnosis not present

## 2023-06-22 LAB — BASIC METABOLIC PANEL WITH GFR
Anion gap: 9 (ref 5–15)
BUN: 13 mg/dL (ref 8–23)
CO2: 21 mmol/L — ABNORMAL LOW (ref 22–32)
Calcium: 9.4 mg/dL (ref 8.9–10.3)
Chloride: 105 mmol/L (ref 98–111)
Creatinine, Ser: 1.26 mg/dL — ABNORMAL HIGH (ref 0.61–1.24)
GFR, Estimated: >60 mL/min (ref >60)
Glucose, Bld: 98 mg/dL (ref 70–99)
Potassium: 3.4 mmol/L — ABNORMAL LOW (ref 3.5–5.1)
Sodium: 135 mmol/L (ref 135–145)

## 2023-06-22 LAB — CBC
HCT: 38.6 % — ABNORMAL LOW (ref 39.0–52.0)
Hemoglobin: 12.5 g/dL — ABNORMAL LOW (ref 13.0–17.0)
MCH: 31.2 pg (ref 26.0–34.0)
MCHC: 32.4 g/dL (ref 30.0–36.0)
MCV: 96.3 fL (ref 80.0–100.0)
Platelets: 298 10*3/uL (ref 150–400)
RBC: 4.01 MIL/uL — ABNORMAL LOW (ref 4.22–5.81)
RDW: 15.9 % — ABNORMAL HIGH (ref 11.5–15.5)
WBC: 4.8 10*3/uL (ref 4.0–10.5)
nRBC: 0 % (ref 0.0–0.2)

## 2023-06-22 LAB — TROPONIN I (HIGH SENSITIVITY): Troponin I (High Sensitivity): 7 ng/L (ref <18)

## 2023-06-22 LAB — CBG MONITORING, ED: Glucose-Capillary: 94 mg/dL (ref 70–99)

## 2023-06-22 NOTE — ED Provider Notes (Signed)
 [place] [facility]ERGENC[facility] DEPARTMENT AT [facility] Provider Note   CSN: [id number] Arrival date & time: 06/22/23  1849     History  Chief Complaint  Patient presents with   Near Syncope    Bryan Floyd is a 73 y.o. male.  Patient reports that he was sitting outside and drinking beer.  Patient states that he got hot and wanted to lay down.  Patient states that his wife called EMS.  He reports that he feels fine.  Patient denies any chest pain he denies any headache he denies any weakness in any area.  Patient reports he has not been sick.  He denies any nausea vomiting or diarrhea.  Patient reports he has a past medical history of hypertension.  Patient's wife reports that she and her husband were sitting outside for about 2 hours on the porch and drinking beer.  She states that he became sweaty and wanted to lay down.  She states that she became concerned because her first husband had a massive heart attack and died.  Patient states he did not eat any food today because he is trying to lose weight.  The history is provided by the patient. No language interpreter was used.  Near Syncope This is a new problem. The current episode started 3 to 5 hours ago. The problem occurs constantly. The problem has not changed since onset.Nothing aggravates the symptoms. Nothing relieves the symptoms. He has tried nothing for the symptoms.       Home Medications Prior to Admission medications   Medication Sig Start Date End Date Taking? Authorizing Provider  amLODipine  (NORVASC ) 10 MG tablet Take 10 mg by mouth daily.    [provider]  BOOSTRIX 5-2.5-18.5 LF-MCG/0.5 injection  07/02/21   [provider]  brimonidine -timolol  (COMBIGAN ) 0.2-0.5 % ophthalmic solution Place 1 drop into both eyes every 12 (twelve) hours.    [provider]  cetirizine (ZYRTEC) 10 MG tablet Take 10 mg by mouth daily as needed for allergies.     [provider]   cyclobenzaprine  (FLEXERIL ) 10 MG tablet Take 10 mg by mouth 3 (three) times daily as needed. 09/16/18   [provider]  cycloSPORINE (RESTASIS) 0.05 % ophthalmic emulsion 1 drop into affected eye    [provider]  Difluprednate 0.05 % EMUL  04/13/20   [provider]  dorzolamide (TRUSOPT) 2 % ophthalmic solution 1 drop 3 (three) times daily. 09/10/21   [provider]  finasteride  (PROSCAR ) 5 MG tablet Take 1 tablet (5 mg total) by mouth daily. 07/25/13   Chere Cordon, MD  FLUAD QUADRIVALENT 0.5 ML injection  11/12/18   [provider]  fluticasone Odis Bennetts ALLERGY RELIEF) 50 MCG/ACT nasal spray 2 sprays in each nostril 12/20/20   [provider]  GAVILAX 17 GM/SCOOP powder Take by mouth. 05/07/21   [provider]  GENTLE LAXATIVE 5 MG EC tablet Take 20 mg by mouth as directed. 05/07/21   [provider]  hydrALAZINE  (APRESOLINE ) 100 MG tablet 1 tablet    [provider]  hydrochlorothiazide  (HYDRODIURIL ) 25 MG tablet     [provider]  Latanoprostene Bunod  (VYZULTA ) 0.024 % SOLN     [provider]  metoprolol  succinate (TOPROL -XL) 25 MG 24 hr tablet Take 12.5 mg by mouth daily.    [provider]  metroNIDAZOLE  (FLAGYL ) 500 MG tablet     [provider]  Multiple Vitamin (MULTIVITAMIN WITH MINERALS) TABS tablet Take 1 tablet by  mouth daily. Takes Centrum daily    [provider]  naproxen (NAPROSYN) 500 MG tablet Take 500 mg by mouth daily as needed for moderate pain.     [provider]  neomycin  (MYCIFRADIN ) 500 MG tablet     [provider]  Netarsudil-Latanoprost  (ROCKLATAN) 0.02-0.005 % SOLN Apply to eye.    [provider]  NONFORMULARY OR COMPOUNDED ITEM Antifungal solution: Terbinafine 3%, Fluconazole 2%, Tea Tree Oil 5%, Urea 10%, Ibuprofen  2% in DMSO suspension #30mL 10/17/19   Galaway, Nancyann Aye, DPM  olmesartan-hydrochlorothiazide   (BENICAR HCT) 40-25 MG tablet 1 tablet 06/20/20   [provider]  Propylene Glycol (SYSTANE COMPLETE OP) Apply to eye.    [provider]  RESTASIS 0.05 % ophthalmic emulsion  07/02/20   [provider]  rosuvastatin  (CRESTOR ) 10 MG tablet Take 1 tablet by mouth every evening. 08/25/20   [provider]  tamsulosin  (FLOMAX ) 0.4 MG CAPS capsule Take 1 capsule (0.4 mg total) by mouth daily after supper. 07/25/13   Chere Cordon, MD  Vitamins/Minerals TABS Take by mouth.    [provider]      Allergies    Atenolol , Pravastatin, and Pravastatin sodium    Review of Systems   Review of Systems  Constitutional:  Positive for diaphoresis.  Cardiovascular:  Positive for near-syncope.  Neurological:  Positive for weakness.  All other systems reviewed and are negative.   Physical Exam Updated Vital Signs BP 128/66   Pulse 62   Temp 98.2 F (36.8 C) (Oral)   Resp 16   Ht 5\' 10"  (1.778 m)   Wt 111.1 kg   SpO2 98%   BMI 35.15 kg/m  Physical Exam Vitals and nursing note reviewed.  Constitutional:      Appearance: He is well-developed.  HENT:     Head: Normocephalic.     Right Ear: External ear normal.     Left Ear: External ear normal.     Nose: Nose normal.     Mouth/Throat:     Mouth: Mucous membranes are moist.  Eyes:     Extraocular Movements: Extraocular movements intact.     Conjunctiva/sclera: Conjunctivae normal.     Pupils: Pupils are equal, round, and reactive to light.  Cardiovascular:     Rate and Rhythm: Normal rate.  Pulmonary:     Effort: Pulmonary effort is normal.  Abdominal:     General: There is no distension.  Musculoskeletal:        General: Normal range of motion.     Cervical back: Normal range of motion.  Skin:    General: Skin is warm.  Neurological:     General: No focal deficit present.     Mental Status: He is alert and oriented to person, place, and time. Mental status is at baseline.     Cranial  Nerves: No cranial nerve deficit.     Comments: Patient able to ambulate without difficulty.  Moves all extremities.  No facial weakness.  Psychiatric:        Mood and Affect: Mood normal.     ED Results / Procedures / Treatments   Labs (all labs ordered are listed, but only abnormal results are displayed) Labs Reviewed  CBC - Abnormal; Notable for the following components:      Result Value   RBC 4.01 (*)    Hemoglobin 12.5 (*)    HCT 38.6 (*)    RDW 15.9 (*)    All other components within  normal limits  BASIC METABOLIC PANEL WITH GFR - Abnormal; Notable for the following components:   Potassium 3.4 (*)    CO2 21 (*)    Creatinine, Ser 1.26 (*)    All other components within normal limits  URINALYSIS, ROUTINE W REFLEX MICROSCOPIC  CBG MONITORING, ED  TROPONIN I (HIGH SENSITIVITY)    EKG None  Radiology No results found.  Procedures Procedures    Medications Ordered in ED Medications - No data to display  ED Course/ Medical Decision Making/ A&P                                 Medical Decision Making Patient reports that he and his wife were sitting in the sun and drinking beer.  Patient states he became hot and sweaty and wanted to lay down.  He states his wife panicked and called EMS because she got scared that something was happening to him.  Patient states that he feels fine.  Amount and/or Complexity of Data Reviewed Independent Historian: EMS    Details: EMS reports patient had near syncope.  He did not blackout he did not fall he did not injure himself Labs: ordered. Decision-making details documented in ED Course.    Details: Labs ordered reviewed and interpreted hemoglobin is 12.5.  Troponin is negative.  Creatinine is slightly elevated at 1.26 ECG/medicine tests: ordered and independent interpretation performed. Decision-making details documented in ED Course.    Details: EKG shows normal sinus normal EKG.  Risk Risk Details: Patient observed x 3  hours.  He has no neurodeficit no chest pain.  Patient feels fine and wants to go home.  Patient's wife states he appears normal.  Patient eating and drinking.  I advised patient to avoid alcohol.  He is advised to return to the emergency department if symptoms worsen or change.  He should follow-up with his primary care physician for recheck this week.           Final Clinical Impression(s) / ED Diagnoses Final diagnoses:  Near syncope    Rx / DC Orders ED Discharge Orders     None      An After Visit Summary was printed and given to the patient.    Sandi Crosby, PA-C 06/22/23 2146    Sallyanne Creamer, DO 06/29/23 1138

## 2023-06-22 NOTE — ED Provider Triage Note (Signed)
 Emergency Medicine Provider Triage Evaluation Note  Bryan Floyd , a 73 y.o. male  was evaluated in triage.  Pt complains of almost passing out.  Patient reports that he drank 3 beers and became dizzy.  Patient states he did not blackout.  Patient reports his wife called EMS because he was leaning over.  Patient states that he did not fall he did not strike his head.  EMS reports patient's blood pressure was low and they gave patient 700 mL of fluid.  Review of Systems  Positive: Alcohol intake  Negative: fever  Physical Exam  BP 128/66   Pulse 62   Temp 98.2 F (36.8 C) (Oral)   Resp 16   Ht 5\' 10"  (1.778 m)   Wt 111.1 kg   SpO2 98%   BMI 35.15 kg/m  Gen:   Awake, no distress   Resp:  Normal effort  MSK:   Moves extremities without difficulty  Other:    Medical Decision Making  Medically screening exam initiated at 8:17 PM.  Appropriate orders placed.  Bryan Floyd was informed that the remainder of the evaluation will be completed by another provider, this initial triage assessment does not replace that evaluation, and the importance of remaining in the ED until their evaluation is complete.     Sandi Crosby, [place] 06/22/23 2019

## 2023-06-22 NOTE — ED Triage Notes (Signed)
 BIB EMS from home for syncopal episode. Pt was sitting on porch, had 3 beers. Pt did not fall out of chair, no head injury.  Pt was hypotensive when EMS arrived. Received bolus with EMS

## 2023-06-25 DIAGNOSIS — I10 Essential (primary) hypertension: Secondary | ICD-10-CM | POA: Diagnosis not present

## 2023-06-25 DIAGNOSIS — E785 Hyperlipidemia, unspecified: Secondary | ICD-10-CM | POA: Diagnosis not present

## 2023-06-25 DIAGNOSIS — R55 Syncope and collapse: Secondary | ICD-10-CM | POA: Diagnosis not present

## 2023-06-30 DIAGNOSIS — G5621 Lesion of ulnar nerve, right upper limb: Secondary | ICD-10-CM | POA: Diagnosis not present

## 2023-06-30 DIAGNOSIS — G5622 Lesion of ulnar nerve, left upper limb: Secondary | ICD-10-CM | POA: Diagnosis not present

## 2023-06-30 DIAGNOSIS — G5601 Carpal tunnel syndrome, right upper limb: Secondary | ICD-10-CM | POA: Diagnosis not present

## 2023-06-30 DIAGNOSIS — G5602 Carpal tunnel syndrome, left upper limb: Secondary | ICD-10-CM | POA: Diagnosis not present

## 2023-07-01 ENCOUNTER — Ambulatory Visit: Payer: PPO | Admitting: Podiatry

## 2023-07-01 ENCOUNTER — Encounter: Payer: Self-pay | Admitting: Podiatry

## 2023-07-01 VITALS — Ht 70.0 in | Wt 245.0 lb

## 2023-07-01 DIAGNOSIS — M79675 Pain in left toe(s): Secondary | ICD-10-CM

## 2023-07-01 DIAGNOSIS — M79674 Pain in right toe(s): Secondary | ICD-10-CM

## 2023-07-01 DIAGNOSIS — B351 Tinea unguium: Secondary | ICD-10-CM

## 2023-07-05 NOTE — Progress Notes (Signed)
  Subjective:  Patient ID: Bryan Floyd, male    DOB: 02/20/1951,  MRN: [id number]  73 y.o. male presents painful, elongated thickened toenails x 10 which are symptomatic when wearing enclosed shoe gear. This interferes with his/her daily activities. Chief Complaint  Patient presents with   Nail Problem    Pt is here for [facility] PCP is Dr Camilo Cella and LOV was in November.   New problem(s): None   PCP is Victorio Grave, MD.  Allergies  Allergen Reactions   Atenolol  Other (See Comments)    Fatigue  Other reaction(s): fatigue  Fatigue Other reaction(s): fatigue   Pravastatin Other (See Comments)    myalgia  myalgia    Other reaction(s): myalgia   Pravastatin Sodium Other (See Comments)    Other reaction(s): myalgia    Review of Systems: Negative except as noted in the HPI.   Objective:  Bryan Floyd is a pleasant 73 y.o. male WD, WN in NAD. AAO x 3.  Vascular Examination: Vascular status intact b/l with palpable pedal pulses. CFT immediate b/l. Pedal hair present. No edema. No pain with calf compression b/l. Skin temperature gradient WNL b/l. No varicosities noted. No cyanosis or clubbing noted.  Neurological Examination: Sensation grossly intact b/l with 10 gram monofilament. Vibratory sensation intact b/l.  Dermatological Examination: Pedal skin with normal turgor, texture and tone b/l. No open wounds nor interdigital macerations noted. Toenails 1-5 b/l thick, discolored, elongated with subungual debris and pain on dorsal palpation. No hyperkeratotic lesions noted b/l.   Musculoskeletal Examination: Muscle strength 5/5 to b/l LE.  No pain, crepitus noted b/l. No gross pedal deformities. Patient ambulates independently without assistive aids.   Radiographs: None  Last A1c:       No data to display          Assessment:   1. Pain due to onychomycosis of toenails of both feet     Plan:  Consent given for treatment. Patient examined. All patient's and/or  POA's questions/concerns addressed on today's visit.Toenails 1-5 debrided in length and girth without incident. Continue soft, supportive shoe gear daily. Report any pedal injuries to medical professional. Call office if there are any questions/concerns. -Patient/POA to call should there be question/concern in the interim.  Return in about 3 months (around 10/01/2023).  Bryan Floyd, DPM      [place] LOCATION: [address] [place] [address]                   Office [phone]   [facility] LOCATION: [address] [place], [place] [address] Office [phone]

## 2023-07-15 DIAGNOSIS — H401132 Primary open-angle glaucoma, bilateral, moderate stage: Secondary | ICD-10-CM | POA: Diagnosis not present

## 2023-07-15 DIAGNOSIS — H401123 Primary open-angle glaucoma, left eye, severe stage: Secondary | ICD-10-CM | POA: Diagnosis not present

## 2023-07-15 DIAGNOSIS — H2513 Age-related nuclear cataract, bilateral: Secondary | ICD-10-CM | POA: Diagnosis not present

## 2023-07-28 DIAGNOSIS — G5603 Carpal tunnel syndrome, bilateral upper limbs: Secondary | ICD-10-CM | POA: Diagnosis not present

## 2023-07-28 DIAGNOSIS — Z9049 Acquired absence of other specified parts of digestive tract: Secondary | ICD-10-CM | POA: Diagnosis not present

## 2023-07-28 DIAGNOSIS — G5623 Lesion of ulnar nerve, bilateral upper limbs: Secondary | ICD-10-CM | POA: Diagnosis not present

## 2023-07-28 DIAGNOSIS — J309 Allergic rhinitis, unspecified: Secondary | ICD-10-CM | POA: Diagnosis not present

## 2023-07-28 DIAGNOSIS — E041 Nontoxic single thyroid nodule: Secondary | ICD-10-CM | POA: Diagnosis not present

## 2023-07-28 DIAGNOSIS — E785 Hyperlipidemia, unspecified: Secondary | ICD-10-CM | POA: Diagnosis not present

## 2023-07-28 DIAGNOSIS — I10 Essential (primary) hypertension: Secondary | ICD-10-CM | POA: Diagnosis not present

## 2023-07-28 DIAGNOSIS — Z0000 Encounter for general adult medical examination without abnormal findings: Secondary | ICD-10-CM | POA: Diagnosis not present

## 2023-07-28 DIAGNOSIS — Z1331 Encounter for screening for depression: Secondary | ICD-10-CM | POA: Diagnosis not present

## 2023-07-28 DIAGNOSIS — N400 Enlarged prostate without lower urinary tract symptoms: Secondary | ICD-10-CM | POA: Diagnosis not present

## 2023-10-13 ENCOUNTER — Ambulatory Visit (INDEPENDENT_AMBULATORY_CARE_PROVIDER_SITE_OTHER): Admitting: Podiatry

## 2023-10-13 ENCOUNTER — Encounter: Payer: Self-pay | Admitting: Podiatry

## 2023-10-13 DIAGNOSIS — M79675 Pain in left toe(s): Secondary | ICD-10-CM | POA: Diagnosis not present

## 2023-10-13 DIAGNOSIS — M79674 Pain in right toe(s): Secondary | ICD-10-CM | POA: Diagnosis not present

## 2023-10-13 DIAGNOSIS — B351 Tinea unguium: Secondary | ICD-10-CM | POA: Diagnosis not present

## 2023-10-18 NOTE — Progress Notes (Signed)
  Subjective:  Patient ID: Bryan Floyd, male    DOB: 05/24/1950,  MRN: [id number]  73 y.o. male presents painful thick toenails that are difficult to trim. Pain interferes with ambulation. Aggravating factors include wearing enclosed shoe gear. Pain is relieved with periodic professional debridement. Chief Complaint  Patient presents with   RFC    Rm16 Routine foot care Dr. Teresa last visit May 2025    New problem(s): None   PCP is Teresa Channel, MD.  Allergies  Allergen Reactions   Atenolol  Other (See Comments)    Fatigue  Other reaction(s): fatigue  Fatigue Other reaction(s): fatigue   Pravastatin Other (See Comments)    myalgia  myalgia    Other reaction(s): myalgia   Pravastatin Sodium Other (See Comments)    Other reaction(s): myalgia    Review of Systems: Negative except as noted in the HPI.   Objective:  Bryan Floyd is a pleasant 73 y.o. male in NAD. AAO x 3.  Vascular Examination: Vascular status intact b/l with palpable pedal pulses. CFT immediate b/l. Pedal hair present. No edema. No pain with calf compression b/l. Skin temperature gradient WNL b/l. No varicosities noted. No cyanosis or clubbing noted.  Neurological Examination: Sensation grossly intact b/l with 10 gram monofilament. Vibratory sensation intact b/l.  Dermatological Examination: Pedal skin with normal turgor, texture and tone b/l. No open wounds nor interdigital macerations noted. Toenails 1-5 b/l thick, discolored, elongated with subungual debris and pain on dorsal palpation. No hyperkeratotic lesions noted b/l.   Musculoskeletal Examination: Muscle strength 5/5 to b/l LE.  No pain, crepitus noted b/l. No gross pedal deformities. Patient ambulates independently without assistive aids.   Radiographs: None  Last A1c:       No data to display           Assessment:   1. Pain due to onychomycosis of toenails of both feet    Plan:  Patient was evaluated and treated. All  patient's and/or POA's questions/concerns addressed on today's visit. Mycotic toenails 1-5 debrided in length and girth without incident. Treatment was provided by assistant Mable FABIENE Cherry under my supervision. Continue soft, supportive shoe gear daily. Report any pedal injuries to medical professional. Call office if there are any quesitons/concerns. -Patient/POA to call should there be question/concern in the interim.  Return in about 3 months (around 01/13/2024).  Bryan Floyd, DPM      [facility] LOCATION: [address] [place] [address]                   Office [phone]   [facility] LOCATION: [address] [place], [place] [address] Office [phone]

## 2023-10-27 DIAGNOSIS — H401133 Primary open-angle glaucoma, bilateral, severe stage: Secondary | ICD-10-CM | POA: Diagnosis not present

## 2023-12-29 DIAGNOSIS — H401133 Primary open-angle glaucoma, bilateral, severe stage: Secondary | ICD-10-CM | POA: Diagnosis not present

## 2024-01-26 ENCOUNTER — Encounter: Payer: Self-pay | Admitting: Podiatry

## 2024-01-26 ENCOUNTER — Ambulatory Visit: Admitting: Podiatry

## 2024-01-26 DIAGNOSIS — B351 Tinea unguium: Secondary | ICD-10-CM

## 2024-01-26 DIAGNOSIS — M79674 Pain in right toe(s): Secondary | ICD-10-CM

## 2024-01-26 DIAGNOSIS — M79675 Pain in left toe(s): Secondary | ICD-10-CM | POA: Diagnosis not present

## 2024-01-27 DIAGNOSIS — N400 Enlarged prostate without lower urinary tract symptoms: Secondary | ICD-10-CM | POA: Diagnosis not present

## 2024-01-27 DIAGNOSIS — E049 Nontoxic goiter, unspecified: Secondary | ICD-10-CM | POA: Diagnosis not present

## 2024-01-27 DIAGNOSIS — E041 Nontoxic single thyroid nodule: Secondary | ICD-10-CM | POA: Diagnosis not present

## 2024-01-27 DIAGNOSIS — E785 Hyperlipidemia, unspecified: Secondary | ICD-10-CM | POA: Diagnosis not present

## 2024-01-27 DIAGNOSIS — Z86018 Personal history of other benign neoplasm: Secondary | ICD-10-CM | POA: Diagnosis not present

## 2024-01-27 DIAGNOSIS — I10 Essential (primary) hypertension: Secondary | ICD-10-CM | POA: Diagnosis not present

## 2024-01-28 ENCOUNTER — Other Ambulatory Visit: Payer: Self-pay | Admitting: Family Medicine

## 2024-01-28 DIAGNOSIS — E049 Nontoxic goiter, unspecified: Secondary | ICD-10-CM

## 2024-01-28 DIAGNOSIS — E041 Nontoxic single thyroid nodule: Secondary | ICD-10-CM

## 2024-01-31 ENCOUNTER — Encounter: Payer: Self-pay | Admitting: Podiatry

## 2024-01-31 NOTE — Progress Notes (Signed)
  Subjective:  Patient ID: Bryan Floyd, male    DOB: Aug 13, 1950,  MRN: [id number]  Bryan Floyd presents to clinic today for painful mycotic toenails of both feet that are difficult to trim. Pain interferes with daily activities and wearing enclosed shoe gear comfortably.  Chief Complaint  Patient presents with   Nail Problem    Thick painful toenails, 3 month follow up    New problem(s): None.   PCP is Teresa Channel, MD.  Allergies  Allergen Reactions   Acetazolamide Other (See Comments)   Atenolol  Other (See Comments)    Fatigue  Other reaction(s): fatigue  Fatigue Other reaction(s): fatigue   Pravastatin Other (See Comments)    myalgia  myalgia    Other reaction(s): myalgia   Pravastatin Sodium Other (See Comments)    Other reaction(s): myalgia    Review of Systems: Negative except as noted in the HPI.  Objective: No changes noted in today's physical examination. There were no vitals filed for this visit. Bryan Floyd is a pleasant 73 y.o. male in NAD. AAO x 3.  Vascular Examination: Capillary refill time immediate b/l. Vascular status intact b/l with palpable pedal pulses. Pedal hair present b/l. No pain with calf compression b/l. Skin temperature gradient WNL b/l. No cyanosis or clubbing b/l. No ischemia or gangrene noted b/l.   Neurological Examination: Sensation grossly intact b/l with 10 gram monofilament. Vibratory sensation intact b/l.   Dermatological Examination: Pedal skin with normal turgor, texture and tone b/l.  No open wounds. No interdigital macerations.   Toenails 1-5 b/l thick, discolored, elongated with subungual debris and pain on dorsal palpation.   No corns, calluses, nor porokeratotic lesions.  Musculoskeletal Examination: Muscle strength 5/5 to all lower extremity muscle groups bilaterally. No pain, crepitus or joint limitation noted with ROM bilateral LE.  Radiographs: None  Assessment/Plan: 1. Pain due to  onychomycosis of toenails of both feet   Consent given for treatment. Patient examined. All patient's and/or POA's questions/concerns addressed on today's visit. Toenails 1-5 b/l debrided in length and girth without incident. Continue foot and shoe inspections daily. Monitor blood glucose per PCP/Endocrinologist's recommendations. Continue soft, supportive shoe gear daily. Report any pedal injuries to medical professional. Call office if there are any questions/concerns. -Patient/POA to call should there be question/concern in the interim.   Return in about 3 months (around 04/25/2024).  Bryan Floyd, DPM      [facility] LOCATION: [address], [place] [address]                   Office [phone]   [facility] LOCATION: [address] [place], [place] [address] Office [phone]

## 2024-02-05 ENCOUNTER — Inpatient Hospital Stay: Admission: RE | Admit: 2024-02-05 | Discharge: 2024-02-05 | Attending: Family Medicine | Admitting: Family Medicine

## 2024-02-05 DIAGNOSIS — E049 Nontoxic goiter, unspecified: Secondary | ICD-10-CM

## 2024-02-05 DIAGNOSIS — E041 Nontoxic single thyroid nodule: Secondary | ICD-10-CM

## 2024-02-11 ENCOUNTER — Other Ambulatory Visit: Payer: Self-pay | Admitting: Family Medicine

## 2024-02-11 DIAGNOSIS — E041 Nontoxic single thyroid nodule: Secondary | ICD-10-CM

## 2024-03-18 ENCOUNTER — Ambulatory Visit
Admission: RE | Admit: 2024-03-18 | Discharge: 2024-03-18 | Disposition: A | Source: Ambulatory Visit | Attending: Family Medicine | Admitting: Family Medicine

## 2024-03-18 ENCOUNTER — Other Ambulatory Visit (HOSPITAL_COMMUNITY)
Admission: RE | Admit: 2024-03-18 | Discharge: 2024-03-18 | Disposition: A | Source: Ambulatory Visit | Attending: Family Medicine | Admitting: Family Medicine

## 2024-03-18 DIAGNOSIS — E041 Nontoxic single thyroid nodule: Secondary | ICD-10-CM | POA: Insufficient documentation

## 2024-03-21 ENCOUNTER — Other Ambulatory Visit

## 2024-03-22 LAB — CYTOLOGY - NON PAP

## 2024-05-10 ENCOUNTER — Ambulatory Visit: Admitting: Podiatry
